# Patient Record
Sex: Male | Born: 1950 | Race: Black or African American | Hispanic: No | Marital: Single | Smoking: Current some day smoker
Health system: Southern US, Community
[De-identification: ages and names within clinical notes are randomized; demographics above are authoritative.]

## PROBLEM LIST (undated history)

## (undated) DIAGNOSIS — E119 Type 2 diabetes mellitus without complications: Secondary | ICD-10-CM

## (undated) DIAGNOSIS — Z720 Tobacco use: Secondary | ICD-10-CM

## (undated) DIAGNOSIS — I2510 Atherosclerotic heart disease of native coronary artery without angina pectoris: Secondary | ICD-10-CM

## (undated) DIAGNOSIS — I509 Heart failure, unspecified: Secondary | ICD-10-CM

## (undated) DIAGNOSIS — R413 Other amnesia: Secondary | ICD-10-CM

## (undated) DIAGNOSIS — I10 Essential (primary) hypertension: Secondary | ICD-10-CM

## (undated) DIAGNOSIS — I4891 Unspecified atrial fibrillation: Secondary | ICD-10-CM

## (undated) DIAGNOSIS — I639 Cerebral infarction, unspecified: Secondary | ICD-10-CM

## (undated) DIAGNOSIS — E785 Hyperlipidemia, unspecified: Secondary | ICD-10-CM

## (undated) HISTORY — DX: Atherosclerotic heart disease of native coronary artery without angina pectoris: I25.10

## (undated) HISTORY — DX: Tobacco use: Z72.0

## (undated) HISTORY — PX: CARDIAC CATHETERIZATION: SHX172

## (undated) HISTORY — DX: Hyperlipidemia, unspecified: E78.5

## (undated) HISTORY — PX: LUNG BIOPSY: SHX232

## (undated) HISTORY — DX: Unspecified atrial fibrillation: I48.91

## (undated) HISTORY — DX: Heart failure, unspecified: I50.9

---

## 2013-03-30 ENCOUNTER — Inpatient Hospital Stay (HOSPITAL_COMMUNITY): Payer: Medicaid Other

## 2013-03-30 ENCOUNTER — Inpatient Hospital Stay (HOSPITAL_COMMUNITY)
Admission: EM | Admit: 2013-03-30 | Discharge: 2013-04-04 | DRG: 064 | Disposition: A | Discharge status: Skilled Nursing Facility | Payer: Medicaid Other | Attending: Internal Medicine | Admitting: Internal Medicine

## 2013-03-30 ENCOUNTER — Emergency Department (HOSPITAL_COMMUNITY): Payer: Medicaid Other

## 2013-03-30 ENCOUNTER — Encounter (HOSPITAL_COMMUNITY): Payer: Self-pay | Admitting: Emergency Medicine

## 2013-03-30 DIAGNOSIS — I5021 Acute systolic (congestive) heart failure: Secondary | ICD-10-CM | POA: Diagnosis present

## 2013-03-30 DIAGNOSIS — I5022 Chronic systolic (congestive) heart failure: Secondary | ICD-10-CM

## 2013-03-30 DIAGNOSIS — Z79899 Other long term (current) drug therapy: Secondary | ICD-10-CM

## 2013-03-30 DIAGNOSIS — I10 Essential (primary) hypertension: Secondary | ICD-10-CM | POA: Diagnosis present

## 2013-03-30 DIAGNOSIS — E785 Hyperlipidemia, unspecified: Secondary | ICD-10-CM | POA: Diagnosis present

## 2013-03-30 DIAGNOSIS — I2589 Other forms of chronic ischemic heart disease: Secondary | ICD-10-CM | POA: Diagnosis present

## 2013-03-30 DIAGNOSIS — IMO0001 Reserved for inherently not codable concepts without codable children: Secondary | ICD-10-CM | POA: Diagnosis present

## 2013-03-30 DIAGNOSIS — R29810 Facial weakness: Secondary | ICD-10-CM | POA: Diagnosis present

## 2013-03-30 DIAGNOSIS — I519 Heart disease, unspecified: Secondary | ICD-10-CM

## 2013-03-30 DIAGNOSIS — I6350 Cerebral infarction due to unspecified occlusion or stenosis of unspecified cerebral artery: Principal | ICD-10-CM | POA: Diagnosis present

## 2013-03-30 DIAGNOSIS — I639 Cerebral infarction, unspecified: Secondary | ICD-10-CM | POA: Diagnosis present

## 2013-03-30 DIAGNOSIS — E119 Type 2 diabetes mellitus without complications: Secondary | ICD-10-CM

## 2013-03-30 DIAGNOSIS — Z8673 Personal history of transient ischemic attack (TIA), and cerebral infarction without residual deficits: Secondary | ICD-10-CM

## 2013-03-30 DIAGNOSIS — Z91199 Patient's noncompliance with other medical treatment and regimen due to unspecified reason: Secondary | ICD-10-CM

## 2013-03-30 DIAGNOSIS — Z9119 Patient's noncompliance with other medical treatment and regimen: Secondary | ICD-10-CM

## 2013-03-30 DIAGNOSIS — I509 Heart failure, unspecified: Secondary | ICD-10-CM | POA: Diagnosis present

## 2013-03-30 DIAGNOSIS — R471 Dysarthria and anarthria: Secondary | ICD-10-CM | POA: Diagnosis present

## 2013-03-30 DIAGNOSIS — F17200 Nicotine dependence, unspecified, uncomplicated: Secondary | ICD-10-CM | POA: Diagnosis present

## 2013-03-30 DIAGNOSIS — R1310 Dysphagia, unspecified: Secondary | ICD-10-CM | POA: Diagnosis present

## 2013-03-30 HISTORY — DX: Essential (primary) hypertension: I10

## 2013-03-30 HISTORY — DX: Cerebral infarction, unspecified: I63.9

## 2013-03-30 HISTORY — DX: Type 2 diabetes mellitus without complications: E11.9

## 2013-03-30 LAB — GLUCOSE, CAPILLARY
Glucose-Capillary: 228 mg/dL — ABNORMAL HIGH (ref 70–99)
Glucose-Capillary: 168 mg/dL — ABNORMAL HIGH (ref 70–99)

## 2013-03-30 LAB — BASIC METABOLIC PANEL
CO2: 27 mEq/L (ref 19–32)
Calcium: 9.7 mg/dL (ref 8.4–10.5)
GFR calc Af Amer: >90 mL/min (ref >90)
Glucose, Bld: 231 mg/dL — ABNORMAL HIGH (ref 70–99)
Sodium: 138 mEq/L (ref 135–145)

## 2013-03-30 LAB — CBC WITH DIFFERENTIAL/PLATELET
Basophils Absolute: 0.1 10*3/uL (ref 0.0–0.1)
Eosinophils Relative: 6 % — ABNORMAL HIGH (ref 0–5)
HCT: 48.4 % (ref 39.0–52.0)
Hemoglobin: 16.7 g/dL (ref 13.0–17.0)
Lymphocytes Relative: 43 % (ref 12–46)
Lymphs Abs: 2.7 10*3/uL (ref 0.7–4.0)
MCH: 28.3 pg (ref 26.0–34.0)
MCHC: 34.5 g/dL (ref 30.0–36.0)
MCV: 81.9 fL (ref 78.0–100.0)
Monocytes Absolute: 0.6 10*3/uL (ref 0.1–1.0)
Platelets: 219 10*3/uL (ref 150–400)
RBC: 5.91 MIL/uL — ABNORMAL HIGH (ref 4.22–5.81)
WBC: 6.3 10*3/uL (ref 4.0–10.5)

## 2013-03-30 LAB — ETHANOL: Alcohol, Ethyl (B): <11 mg/dL (ref 0–11)

## 2013-03-30 MED ORDER — STARCH (THICKENING) PO POWD
ORAL | Status: DC | PRN
  Filled 2013-03-30: qty 227

## 2013-03-30 MED ORDER — SENNOSIDES-DOCUSATE SODIUM 8.6-50 MG PO TABS: 1.0000 | ORAL_TABLET | Freq: Every day | ORAL | Status: DC

## 2013-03-30 MED ORDER — ENOXAPARIN SODIUM 40 MG/0.4ML ~~LOC~~ SOLN
40.0000 mg | SUBCUTANEOUS | Status: DC
  Administered 2013-03-30 – 2013-04-03 (×5): 40 mg via SUBCUTANEOUS
  Filled 2013-03-30 (×6): qty 0.4

## 2013-03-30 MED ORDER — ASPIRIN 325 MG PO TABS
325.0000 mg | ORAL_TABLET | Freq: Every day | ORAL | Status: DC
  Administered 2013-03-30 – 2013-04-04 (×6): 325 mg via ORAL
  Filled 2013-03-30 (×6): qty 1

## 2013-03-30 MED ORDER — HEPARIN SODIUM (PORCINE) 5000 UNIT/ML IJ SOLN
5000.0000 [IU] | Freq: Three times a day (TID) | INTRAMUSCULAR | Status: DC
  Filled 2013-03-30 (×3): qty 1

## 2013-03-30 MED ORDER — INSULIN ASPART 100 UNIT/ML ~~LOC~~ SOLN
0.0000 [IU] | Freq: Three times a day (TID) | SUBCUTANEOUS | Status: DC
  Administered 2013-03-31: 2 [IU] via SUBCUTANEOUS
  Administered 2013-03-31: 5 [IU] via SUBCUTANEOUS
  Administered 2013-03-31: 1 [IU] via SUBCUTANEOUS
  Administered 2013-04-01 (×2): 2 [IU] via SUBCUTANEOUS
  Administered 2013-04-02: 3 [IU] via SUBCUTANEOUS
  Administered 2013-04-02: 2 [IU] via SUBCUTANEOUS
  Administered 2013-04-03: 1 [IU] via SUBCUTANEOUS
  Administered 2013-04-04: 2 [IU] via SUBCUTANEOUS

## 2013-03-30 MED ORDER — ASPIRIN 300 MG RE SUPP
300.0000 mg | Freq: Every day | RECTAL | Status: DC
  Filled 2013-03-30 (×6): qty 1

## 2013-03-30 NOTE — H&P (Signed)
Date: 03/30/2013               Patient Name:  Jerry Graham MRN: [id number]  DOB: 04/22/1951 Age / Sex: 62 y.o., male   PCP: No primary provider on file.         Medical Service: Internal Medicine Teaching Service         Attending Physician: Dr. Inez Catalina, MD    First Contact: Dr. Vivi Barrack Pager: [phone]  Second Contact: Dr. Suszanne Conners Pager: [phone]       After Hours (After 5p/  First Contact Pager: [phone]  weekends / holidays): Second Contact Pager: [phone]   Chief Complaint: "My mouth is twisted  History of Present Illness:  Jerry Graham is a 63 y.o. male PMH DM2, HTN, tobacco abuse who presents with a left facial droop.  The patient reports he woke up this morning and his "mouth was twisted". He also endorses drooling and trouble swallowing his pills. He got in the car this morning and started driving, and noted he kept drifting into the left lane. He drove back home. He denies chest pain, SOB, abdominal pain, nausea, vomiting, diarrhea, constipation, headache, blurry vision, double vision, visual field cuts, orthopnea, leg swelling. He denies focal weakness or numbness, but does state he felt clumsy on his feet this morning.  He notes the exact same symptoms happened to him last year, with a left sided facial droop and trouble swallowing. He went to the hospital and his symptoms resolved overnight. He was told he had a "mini stroke." He remembers they did a CT of the head and an echocardiogram, among other things. He was living in [place] at the time and went to [facility]. Records have been requested. He does not take a daily aspirin.   He moved to [place] 5 months ago and has not set up with a primary care doctor here. He has some family locally, a cousin. 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 He is a 1/2 PPD smoker and also uses an e-cigarette. He denies alcohol or drug use. 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   Home Meds: Current Outpatient Prescriptions  Medication Sig Dispense Refill  . glipiZIDE (GLUCOTROL) 5 MG tablet Take 5 mg by mouth daily.      Marland Kitchen lisinopril (PRINIVIL,ZESTRIL) 20 MG tablet Take 20 mg by mouth daily.      . metFORMIN (GLUCOPHAGE) 850 MG tablet Take 850 mg by mouth daily.        Allergies: Allergies as of 03/30/2013  . (No Known Allergies)   Past Medical History  Diagnosis Date  . Hypertension   . Diabetes mellitus without complication   . Stroke    History reviewed. No pertinent past surgical history. History reviewed. No pertinent family history. History   Social History  . Marital Status: Single    Spouse Name: N/A    Number of Children: N/A  . Years of Education: N/A   Occupational History  . Not on file.   Social History Main Topics  . Smoking status: Current Every Day Smoker  . Smokeless tobacco: Not on file  . Alcohol Use: No  . Drug Use: No  . Sexual Activity: Not on file   Other Topics Concern  . Not on file   Social History Narrative  . No narrative on file  Review of Systems: Pertinent items are noted in HPI.  Physical Exam: Blood pressure 175/93, pulse 86, temperature 98.7 F (37.1 C), temperature source Oral, resp. rate 18, height 5' 10.5" (1.791 m), weight 178 lb 1.6 oz (80.786 kg), SpO2 94.00%. Physical Exam  Constitutional: He is oriented to person, place, and time and well-developed, well-nourished, and in no distress.  HENT:  Head: Normocephalic and atraumatic.  Mouth/Throat: Oropharynx is clear and moist.  Eyes: Conjunctivae and EOM are normal. Pupils are equal, round, and reactive to light.  Neck: Normal range of motion. Neck supple.  Cardiovascular: Normal rate, regular rhythm and normal heart sounds.  Exam reveals no gallop and no friction rub.   No murmur heard. Pulmonary/Chest: Effort normal. No respiratory distress. He has no wheezes. He has rales (Soft  crackles at lung bases bilaterally). He exhibits no tenderness.  Abdominal: Soft. There is no tenderness.  Musculoskeletal: Normal range of motion. He exhibits no edema and no tenderness.  Neurological: He is alert and oriented to person, place, and time. He has normal reflexes. He displays facial asymmetry and abnormal speech (Dysarthria). He displays no weakness, no atrophy, no tremor, normal stance and normal reflexes. A cranial nerve deficit (Left lower facial droop with forehead sparing. Tongue deviation to left. Decreased hearing on left. ) is present. No sensory deficit. He exhibits normal muscle tone. He has an abnormal Cerebellar Exam (Impaired rapid alternating finger movements in left hand). He has a normal Finger-Nose-Finger Test and a normal Romberg Test. Gait normal. Coordination abnormal. Gait normal. GCS score is 15.  Reflex Scores:      Brachioradialis reflexes are 2+ on the right side and 2+ on the left side.      Patellar reflexes are 2+ on the right side and 2+ on the left side. Skin: Skin is warm and dry. He is not diaphoretic.  Psychiatric: Affect normal.     Lab results: Basic Metabolic Panel:  Recent Labs  21/30/86 1141  NA 138  K 4.2  CL 100  CO2 27  GLUCOSE 231*  BUN 10  CREATININE 0.75  CALCIUM 9.7   CBC:  Recent Labs  03/30/13 1141  WBC 6.3  NEUTROABS 2.5  HGB 16.7  HCT 48.4  MCV 81.9  PLT 219  No left shift.  CBG:  Recent Labs  03/30/13 1113 03/30/13 1713  GLUCAP 228* 168*   Alcohol Level:  Recent Labs  03/30/13 1141  ETH <11    Imaging results:  Ct Head Wo Contrast  03/30/2013   CLINICAL DATA:  Headache, vertigo  EXAM: CT HEAD WITHOUT CONTRAST  TECHNIQUE: Contiguous axial images were obtained from the base of the skull through the vertex without intravenous contrast.  COMPARISON:  None.  FINDINGS: No skull fracture is noted. Paranasal sinuses and mastoid air cells are unremarkable.  No intracranial hemorrhage, mass effect or  midline shift. Mild cerebral atrophy. Mild periventricular and patchy subcortical white matter decreased attenuation probable due to chronic small vessel ischemic changes. There is small lacunar infarct measures about 5 mm in right thalamus. There is small area of decreased attenuation in right basal ganglia. Measures about 1.2 cm. This may be due to ischemia of indeterminate age. Clinical correlation is necessary. If recent ischemia suspected further correlation with brain MRI with diffusion imaging is recommended. No definite acute cortical infarction. No mass lesion is noted on this unenhanced scan.  IMPRESSION: No intracranial hemorrhage, mass effect or midline shift. Mild cerebral atrophy.There is small lacunar infarct measures about  5 mm in right thalamus. There is small area of decreased attenuation in right basal ganglia. Measures about 1.2 cm. This may be due to ischemia of indeterminate age. Clinical correlation is necessary. If recent ischemia suspected further correlation with brain MRI with diffusion imaging is recommended. No definite acute cortical infarction.   Electronically Signed   By: Natasha Mead   On: 03/30/2013 13:07    Other results: EKG: No olds. NSR, rate 78. LVH. 1mm ST depression in V5,V6.  Assessment & Plan by Problem: Jerry Graham is a 62 y.o. male PMH DM2, HTN, tobacco abuse who presents with a left facial droop.  #Acute ischemic stroke - Patient with left facial droop with forehead sparing, dysarthria, hand clumsiness. Out of window for TPA. Multiple risk factors for CVA including everyday smoking, HTN, DM2, history of presumed TIA last year. CT remarkable for small lacunar infarct in right thalamus, small area of decreased attenuation in right basal ganglia. He has a history of similar symptoms that resolved. We have requested records from [facility]. - Admit to IMTS, telemetry bed - [facility] up [facility] from [facility] - MRI/MRA - ASA 325 daily (oral or  pr) - 2D echo - Carotid dopplers - Lipid panel - NPO pending SLP eval - Neuro checks q2h x12h, then q4h - NIH stroke scale per nursing shift - PT/OT  #Type II diabetes mellitus - On metformin and glipizide at home. - SSI - Checking A1C - CBG monitoring  #Hypertension - Holding home lisinopril to allow for permissive hypertension in the setting of acute stroke.  #Tobacco abuse - Denies cough, SOB. Patient is everyday smoker. Bibasilar crackles on exam. - Chest x-ray - Smoking cessation counseling  #DVT PPX - Subq heparin  Dispo: Disposition is deferred at this time, awaiting improvement of current medical problems. Anticipated discharge in approximately 1-3 day(s).   The patient does not have a current PCP (No primary provider on file.) and does need an [facility] hospital follow-up appointment after discharge.  The patient does not have transportation limitations that hinder transportation to clinic appointments.  Signed: Vivi Barrack, MD 03/30/2013, 6:31 PM

## 2013-03-30 NOTE — ED Provider Notes (Addendum)
CSN: [id number]     Arrival date & time 03/30/13  1100 History   First MD Initiated Contact with Patient 03/30/13 1106     Chief Complaint  Patient presents with  . Facial Droop    HPI  Jerry Graham just moved in [place] from [place] the summer. During the summer he had an event where he awakened in his left side of his face was drooping. He went [facility] [employer]. He was admitted. He was told that he had a stroke. He states "they didn't otherwise tell me nothing". He is hypertensive diabetic and a smoker. He does take aspirin "sometimes". He was feeling  normally yesterday. However when walking last night he felt like he was falling to his left. He awakened this morning and feels like the left lower side of his face is "sliding off". No changes with vision or hemianopsia. He had difficulty swallowing his pills this morning. He states he put them in his mouth and they fell out a few times. He was able to swallow coffee and juice without choking or coughing. However he states that he's been having trouble "spitting my spit". He has no headache. He denies left arm and leg symptoms. He has been able to walk. He states he does have to hold on when he does. No vertigo.  Past Medical History  Diagnosis Date  . Hypertension   . Diabetes mellitus without complication   . Stroke    History reviewed. No pertinent past surgical history. History reviewed. No pertinent family history. History  Substance Use Topics  . Smoking status: Current Every Day Smoker  . Smokeless tobacco: Not on file  . Alcohol Use: No    Review of Systems  Constitutional: Negative for fever, chills, diaphoresis, appetite change and fatigue.  HENT: Positive for drooling and trouble swallowing. Negative for sore throat and mouth sores.   Eyes: Negative for visual disturbance.  Respiratory: Negative for cough, chest tightness, shortness of breath and wheezing.   Cardiovascular: Negative for chest pain.   Gastrointestinal: Negative for nausea, vomiting, abdominal pain, diarrhea and abdominal distention.  Endocrine: Negative for polydipsia, polyphagia and polyuria.  Genitourinary: Negative for dysuria, frequency and hematuria.  Musculoskeletal: Negative for gait problem.  Skin: Negative for color change, pallor and rash.  Neurological: Positive for weakness and numbness. Negative for dizziness, syncope, light-headedness and headaches.  Hematological: Does not bruise/bleed easily.  Psychiatric/Behavioral: Negative for behavioral problems and confusion.    Allergies  Review of patient's allergies indicates no known allergies.  Home Medications   Current Outpatient Rx  Name  Route  Sig  Dispense  Refill  . glipiZIDE (GLUCOTROL) 5 MG tablet   Oral   Take 5 mg by mouth daily.         Marland Kitchen lisinopril (PRINIVIL,ZESTRIL) 20 MG tablet   Oral   Take 20 mg by mouth daily.         . metFORMIN (GLUCOPHAGE) 850 MG tablet   Oral   Take 850 mg by mouth daily.          BP 168/96  Pulse 59  Temp(Src) 98.8 F (37.1 C) (Oral)  Resp 15  SpO2 97% Physical Exam  Constitutional: He is oriented to person, place, and time. He appears well-developed and well-nourished. No distress.  HENT:  Head: Normocephalic.  Eyes: Conjunctivae are normal. Pupils are equal, round, and reactive to light. No scleral icterus.  Neck: Normal range of motion. Neck supple. No thyromegaly present.  No bruits  Cardiovascular: Normal rate and regular rhythm.  Exam reveals no gallop and no friction rub.   No murmur heard. Normal sinus rhythm on the monitor without ectopy  Pulmonary/Chest: Effort normal and breath sounds normal. No respiratory distress. He has no wheezes. He has no rales.  Abdominal: Soft. Bowel sounds are normal. He exhibits no distension. There is no tenderness. There is no rebound.  Musculoskeletal: Normal range of motion.  Neurological: He is alert and oriented to person, place, and time.  His  visual fields are intact to confrontation. He has a left lower facial droop. He does not have a upper facial droop or Bell's phenomenon. No nystagmus. Full extraocular movements. Normal tongue protrusion. No carotid bruits.  Subtle pronator drift to the LUE.Marland Kitchen Normal strength to the lower extremities supine.. Gait not tested.  Skin: Skin is warm and dry. No rash noted.  Psychiatric: He has a normal mood and affect. His behavior is normal.    ED Course  Procedures (including critical care time) EKG: Indication stroke interpretation sinus rhythm is voltage criteria for LVH with ST depression T wave inversions laterally. No comparison available.  Labs Review Labs Reviewed  CBC WITH DIFFERENTIAL - Abnormal; Notable for the following:    RBC 5.91 (*)    Neutrophils Relative % 40 (*)    Eosinophils Relative 6 (*)    All other components within normal limits  BASIC METABOLIC PANEL - Abnormal; Notable for the following:    Glucose, Bld 231 (*)    All other components within normal limits  GLUCOSE, CAPILLARY - Abnormal; Notable for the following:    Glucose-Capillary 228 (*)    All other components within normal limits  ETHANOL   Imaging Review Ct Head Wo Contrast  03/30/2013   CLINICAL DATA:  Headache, vertigo  EXAM: CT HEAD WITHOUT CONTRAST  TECHNIQUE: Contiguous axial images were obtained from the base of the skull through the vertex without intravenous contrast.  COMPARISON:  None.  FINDINGS: No skull fracture is noted. Paranasal sinuses and mastoid air cells are unremarkable.  No intracranial hemorrhage, mass effect or midline shift. Mild cerebral atrophy. Mild periventricular and patchy subcortical white matter decreased attenuation probable due to chronic small vessel ischemic changes. There is small lacunar infarct measures about 5 mm in right thalamus. There is small area of decreased attenuation in right basal ganglia. Measures about 1.2 cm. This may be due to ischemia of indeterminate age.  Clinical correlation is necessary. If recent ischemia suspected further correlation with brain MRI with diffusion imaging is recommended. No definite acute cortical infarction. No mass lesion is noted on this unenhanced scan.  IMPRESSION: No intracranial hemorrhage, mass effect or midline shift. Mild cerebral atrophy.There is small lacunar infarct measures about 5 mm in right thalamus. There is small area of decreased attenuation in right basal ganglia. Measures about 1.2 cm. This may be due to ischemia of indeterminate age. Clinical correlation is necessary. If recent ischemia suspected further correlation with brain MRI with diffusion imaging is recommended. No definite acute cortical infarction.   Electronically Signed   By: Natasha Mead   On: 03/30/2013 13:07    MDM   1. CVA (cerebral infarction)      He presents with right hemispheric stroke symptoms. He has dysphasia clinically and left lower facial droop. History of same with complete resolution.  Multiple risk factors. CT evaluation pending. He is out of the window for acute lytic treatment.  CT to my read shows no hemorrhage.  Per  radiologist no hemorrhage. No acute cortical infarct. Small areas of lacunar infarcts in the right illness. Perhaps an area of ischemia on the right basal ganglia.  Pt failed bedside swallow study with coughing on drinking water. Will keep NPO for now.  Call placed to Hospitalist re: Admission.    Roney Marion, MD 03/30/13 1317  Roney Marion, MD 03/30/13 [id number]

## 2013-03-30 NOTE — ED Notes (Signed)
Pt states he started to notice that last night he had some difficulty keeping his balance when ambulating. Pt states he woke up this morning and his face was drooping. Visible droop to the left side of face.

## 2013-03-30 NOTE — Progress Notes (Signed)
Patient admitted to room 4N15 at this time. Noted cigarettes, lighter, and wallet with patient. Explained to patient about [facility] policy and patient insisted on keeping his wallet. cigarettes and lighter kept in his med drawer at this time.

## 2013-03-30 NOTE — Evaluation (Signed)
Clinical/Bedside Swallow Evaluation Patient Details  Name: Jerry Graham MRN: [id number] Date of Birth: 04/22/1951  Today's Date: 03/30/2013 Time: 4098-1191 SLP Time Calculation (min): 55 min  Past Medical History:  Past Medical History  Diagnosis Date  . Hypertension   . Diabetes mellitus without complication   . Stroke    Past Surgical History: History reviewed. No pertinent past surgical history. HPI:  62 year old male admitted 03/30/13 with left lower facial weakness.  PMH significant for CVA, HTN, DM, + tobacco, difficulty with pills this morning, failed RNSSS.  CT results indicate no hemorrhage, mild atrophy, small lacunar infarct in the right thalamus, decreased attentuation in the right basal ganglia.   Assessment / Plan / Recommendation Clinical Impression  Oral care completed with suction. Pt speech intelligible, but noticably dysarthric.  Left facial weakness and lingual deviation noted.  Anterior leakage of thin liquid via cup noted during oral care.  Pt appears to tolerate puree, solid and nectar thick liquid consistencies without overt s/s aspiration.  Delayed cough response noted after thin liquid via straw.  Pt is at high risk of aspiration given thalamic/basal ganglia involvement, so objective study is recommended to more formally assess swallow function and safety.  In the interim, convservative diet of dys 2 with nectar thick liquids is recommended, with adherence to posted swallow precautions.  RN aware.      Aspiration Risk  Moderate    Diet Recommendation Dysphagia 2 (Fine chop);Nectar-thick liquid   Liquid Administration via: Straw Medication Administration: Crushed with puree Supervision: Full supervision/cueing for compensatory strategies Compensations: Slow rate;Small sips/bites;Check for pocketing;Follow solids with liquid Postural Changes and/or Swallow Maneuvers: Seated upright 90 degrees;Upright 30-60 min after meal    Other  Recommendations Recommended  Consults: MBS Oral Care Recommendations: Oral care QID Other Recommendations: Have oral suction available;Remove water pitcher   Follow Up Recommendations  Inpatient Rehab    Frequency and Duration min 1 x/week  1 week   Pertinent Vitals/Pain No pain    SLP Swallow Goals Patient will consume recommended diet without observed clinical signs of aspiration with: Moderate assistance Patient will utilize recommended strategies during swallow to increase swallowing safety with: Moderate assistance   Swallow Study Prior Functional Status   Pt reports tolerating regular diet prior to admit    General Date of Onset: 03/30/13 HPI: 62 year old male admitted 03/30/13 with left lower facial weakness.  PMH significant for CVA, HTN, DM, + tobacco, difficulty with pills this morning, failed RNSSS.  CT results indicate no hemorrhage, mild atrophy, small lacunar infarct in the right thalamus, decreased attentuation in the right basal ganglia. Type of Study: Bedside swallow evaluation Diet Prior to this Study: NPO Temperature Spikes Noted: No Respiratory Status: Room air History of Recent Intubation: No Behavior/Cognition: Alert;Cooperative;Pleasant mood Oral Cavity - Dentition: Missing dentition (upper partial) Self-Feeding Abilities: Able to feed self Patient Positioning: Upright in bed Baseline Vocal Quality: Clear Volitional Cough: Weak Volitional Swallow: Able to elicit    Oral/Motor/Sensory Function Overall Oral Motor/Sensory Function: Impaired Labial ROM: Reduced left Labial Symmetry: Abnormal symmetry left Labial Strength: Reduced Labial Sensation: Reduced Lingual ROM: Reduced right Lingual Symmetry: Abnormal symmetry left (deviates to the left) Lingual Strength: Reduced Lingual Sensation: Reduced Facial ROM: Reduced left Facial Symmetry: Left droop;Left drooping eyelid;Right drooping eyelid Facial Strength: Reduced Facial Sensation: Reduced Mandible: Within Functional Limits    Ice Chips Ice chips: Within functional limits Presentation: Spoon   Thin Liquid Thin Liquid: Impaired Presentation: Straw;Cup Oral Phase Impairments: Reduced labial  seal Oral Phase Functional Implications: Left anterior spillage Pharyngeal  Phase Impairments: Cough - Delayed    Nectar Thick Nectar Thick Liquid: Within functional limits Presentation: Straw   Honey Thick Honey Thick Liquid: Not tested   Puree Puree: Within functional limits Presentation: Spoon;Self Fed   Solid   Jerry Graham   Jerry Graham B. Jerry Graham, Jerry Graham, Jerry Graham [phone]  Solid: Within functional limits Presentation: Self Fed       Jerry Graham 03/30/2013,5:03 PM

## 2013-03-30 NOTE — ED Notes (Signed)
Pt here from home with c/o left sided facial drop that he awoke with , lsn would have been the night before , pt also has some slurred speech , pt has had a similar episode in the past

## 2013-03-30 NOTE — Progress Notes (Signed)
Patient is refusing his insulin coverage and Heparin sq. States he does not take shots and that he takes metformin for his sugar control. Explained to him the risks of refusing his meds and patient states he does not care.

## 2013-03-30 NOTE — ED Notes (Signed)
CBG 228 

## 2013-03-30 NOTE — ED Notes (Signed)
Dr. James at bedside  

## 2013-03-31 ENCOUNTER — Inpatient Hospital Stay (HOSPITAL_COMMUNITY): Payer: Medicaid Other

## 2013-03-31 LAB — GLUCOSE, CAPILLARY
Glucose-Capillary: 260 mg/dL — ABNORMAL HIGH (ref 70–99)
Glucose-Capillary: 132 mg/dL — ABNORMAL HIGH (ref 70–99)
Glucose-Capillary: 140 mg/dL — ABNORMAL HIGH (ref 70–99)

## 2013-03-31 LAB — LIPID PANEL
HDL: 38 mg/dL — ABNORMAL LOW (ref >39)
LDL Cholesterol: 197 mg/dL — ABNORMAL HIGH (ref 0–99)
Total CHOL/HDL Ratio: 7.2 RATIO

## 2013-03-31 LAB — HEMOGLOBIN A1C: Mean Plasma Glucose: 226 mg/dL — ABNORMAL HIGH (ref <117)

## 2013-03-31 MED ORDER — METFORMIN HCL 850 MG PO TABS
850.0000 mg | ORAL_TABLET | Freq: Every day | ORAL | Status: DC
  Administered 2013-03-31 – 2013-04-04 (×5): 850 mg via ORAL
  Filled 2013-03-31 (×6): qty 1

## 2013-03-31 MED ORDER — ATORVASTATIN CALCIUM 80 MG PO TABS
80.0000 mg | ORAL_TABLET | Freq: Every day | ORAL | Status: DC
  Administered 2013-03-31 – 2013-04-03 (×4): 80 mg via ORAL
  Filled 2013-03-31 (×5): qty 1

## 2013-03-31 MED ORDER — NICOTINE 14 MG/24HR TD PT24
14.0000 mg | MEDICATED_PATCH | Freq: Every day | TRANSDERMAL | Status: DC
  Administered 2013-03-31 – 2013-04-04 (×5): 14 mg via TRANSDERMAL
  Filled 2013-03-31 (×5): qty 1

## 2013-03-31 MED ORDER — GLIPIZIDE 5 MG PO TABS
5.0000 mg | ORAL_TABLET | Freq: Every day | ORAL | Status: DC
  Administered 2013-03-31 – 2013-04-04 (×5): 5 mg via ORAL
  Filled 2013-03-31 (×6): qty 1

## 2013-03-31 NOTE — Progress Notes (Signed)
  Date: 03/31/2013  Patient name: Jerry Graham  Medical record number: [id number]  Date of birth: 04/22/1951   This patient has been seen and the plan of care was discussed with the house staff. Please see their note for complete details. I concur with their findings with the following additions/corrections:  For his acute stroke, aspirin for 2nd prevention, statin and have noted speech workup and need for dysphagia 3 diet.  He will need to follow up with Neurology as an outpatient.  His TTE, concerningly, revealed some mod to severe systolic heart failure.  Tmw, the team should consider starting him on medications for this issue and also consider consulting HF team for new HF diagnosis and need for outpatient follow up.  He does not appear to be acutely volume overloaded, but he will need to be on appropriate therapy such as statin, aspirin (already on for stroke), Ace-I and BB.  Other issues appropriately covered in Dr. Doristine Locks note.  PT/OT recommend CIR, SW and CM are involved.   Inez Catalina, MD 03/31/2013, 4:13 PM

## 2013-03-31 NOTE — Procedures (Signed)
Objective Swallowing Evaluation: Modified Barium Swallowing Study  Patient Details  Name: Jerry Graham MRN: [id number] Date of Birth: 04/22/1951  Today's Date: 03/31/2013 Time: 1030-1047 SLP Time Calculation (min): 17 min  Past Medical History:  Past Medical History  Diagnosis Date  . Hypertension   . Diabetes mellitus without complication   . Stroke    Past Surgical History: History reviewed. No pertinent past surgical history. HPI:  62 year old male admitted 03/30/13 with left lower facial weakness.  PMH significant for CVA, HTN, DM, + tobacco, difficulty with pills this morning, failed RNSSS.  CT results indicate no hemorrhage, mild atrophy, small lacunar infarct in the right thalamus, decreased attentuation in the right basal ganglia.     Assessment / Plan / Recommendation Clinical Impression  Dysphagia Diagnosis: Moderate oral phase dysphagia;Mild pharyngeal phase dysphagia Clinical impression: Pt demonstrates a primary oral phase dysphagia with left lingual and labial weakness with premature spillage of boluses and moderate oral residuals after the swallow. There are also pharyngeal sensory deficits resulting in penetration/aspration before the swallow with thin liquids with late sensation and ineficient expectoration. Pt would be able to tolerate nectar thick liquids except that oral residuals fall to airway post swallow. Pts cognitive impairments impede timely adherance to cues and strategies. If pt could tuck chin and quickly swallow twice without impulsive talking, nectar thick liquids may be attempted. For now, will recommend dys 3 (mechanical soft) with honey thick liquids until pt demonstrates improved adherance to strategies. A chin tuck is not particularly needed with honey thick liquids, but it improves airway closure for nectar thick liquids.     Treatment Recommendation  Therapy as outlined in treatment plan below    Diet Recommendation Dysphagia 3 (Mechanical  Soft);Honey-thick liquid   Liquid Administration via: Cup Medication Administration: Whole meds with puree Supervision: Full supervision/cueing for compensatory strategies Compensations: Small sips/bites;Check for pocketing;Multiple dry swallows after each bite/sip;Clear throat intermittently Postural Changes and/or Swallow Maneuvers: Seated upright 90 degrees;Upright 30-60 min after meal;Chin tuck    Other  Recommendations Oral Care Recommendations: Oral care BID Other Recommendations: Remove water pitcher   Follow Up Recommendations  Inpatient Rehab    Frequency and Duration min 2x/week  2 weeks   Pertinent Vitals/Pain NA    SLP Swallow Goals Patient will utilize recommended strategies during swallow to increase swallowing safety with: Minimal cueing Swallow Study Goal #2 - Progress: Progressing toward goal   General HPI: 62 year old male admitted 03/30/13 with left lower facial weakness.  PMH significant for CVA, HTN, DM, + tobacco, difficulty with pills this morning, failed RNSSS.  CT results indicate no hemorrhage, mild atrophy, small lacunar infarct in the right thalamus, decreased attentuation in the right basal ganglia. Type of Study: Modified Barium Swallowing Study Reason for Referral: Objectively evaluate swallowing function Diet Prior to this Study: Dysphagia 2 (chopped);Nectar-thick liquids Temperature Spikes Noted: No Respiratory Status: Room air History of Recent Intubation: No Behavior/Cognition: Alert;Cooperative;Pleasant mood Oral Cavity - Dentition: Missing dentition Oral Motor / Sensory Function: Impaired - see Bedside swallow eval Patient Positioning: Upright in chair Baseline Vocal Quality: Wet Volitional Cough: Strong Volitional Swallow: Able to elicit (with effort anc ues) Anatomy: Within functional limits Pharyngeal Secretions: Not observed secondary MBS    Reason for Referral Objectively evaluate swallowing function   Oral Phase Oral Preparation/Oral  Phase Oral Phase: Impaired Oral - Honey Oral - Honey Cup: Left anterior bolus loss;Weak lingual manipulation;Left pocketing in lateral sulci;Lingual/palatal residue;Delayed oral transit Oral - Nectar Oral -  Nectar Cup: Left anterior bolus loss;Weak lingual manipulation;Left pocketing in lateral sulci;Lingual/palatal residue;Delayed oral transit Oral - Thin Oral - Thin Cup: Left anterior bolus loss;Weak lingual manipulation;Left pocketing in lateral sulci;Lingual/palatal residue;Delayed oral transit Oral - Solids Oral - Puree: Left anterior bolus loss;Weak lingual manipulation;Left pocketing in lateral sulci;Lingual/palatal residue;Delayed oral transit Oral - Regular: Left anterior bolus loss;Weak lingual manipulation;Left pocketing in lateral sulci;Lingual/palatal residue;Delayed oral transit Oral - Pill: Left anterior bolus loss;Weak lingual manipulation;Left pocketing in lateral sulci;Lingual/palatal residue;Delayed oral transit   Pharyngeal Phase Pharyngeal Phase Pharyngeal Phase: Impaired Pharyngeal - Honey Pharyngeal - Honey Cup: Delayed swallow initiation;Premature spillage to valleculae Pharyngeal - Nectar Pharyngeal - Nectar Cup: Delayed swallow initiation;Premature spillage to pyriform sinuses;Penetration/Aspiration after swallow;Penetration/Aspiration before swallow;Moderate aspiration Penetration/Aspiration details (nectar cup): Material enters airway, CONTACTS cords and not ejected out;Material enters airway, passes BELOW cords without attempt by patient to eject out (silent aspiration);Material does not enter airway Pharyngeal - Thin Pharyngeal - Thin Cup: Delayed swallow initiation;Premature spillage to pyriform sinuses;Penetration/Aspiration before swallow;Penetration/Aspiration after swallow;Moderate aspiration Penetration/Aspiration details (thin cup): Material enters airway, passes BELOW cords and not ejected out despite cough attempt by patient Pharyngeal -  Solids Pharyngeal - Puree: Delayed swallow initiation Pharyngeal - Regular: Delayed swallow initiation Pharyngeal - Pill: Delayed swallow initiation  Cervical Esophageal Phase    GO    Cervical Esophageal Phase Cervical Esophageal Phase: [facility]        Harlon Ditty, MA CCC-SLP [phone]  Claudine Mouton 03/31/2013, 11:17 AM

## 2013-03-31 NOTE — Evaluation (Signed)
I have reviewed this note and agree with all findings. Kati Meranda Dechaine, PT, DPT Pager: [phone]   

## 2013-03-31 NOTE — Progress Notes (Signed)
Subjective: Patient seen at bedside. He feels well. He still has a facial droop. He says a bunch of therapists have come by to see him and he is amenable to eating a dysphagia diet and going to rehab to improve his strength.   Records from [place] obtained. Patient was admitted from 10/04/11-10/06/12 for left-sided facial droop, found to have a right internal capsule infarct on MRI. Patient left AMA before a full workup (including 2D echo) could be performed. He was d/c on Zocor and ASA but did not comply apparently. There was also a questionable lung mass seen on his admission CXR, which per the discharge summary was NOT able to be worked up due to the patient leaving AMA. (However, in the packet of charts they faxed there is a report for a CT chest w/o contrast dated 10/06/11 that found no lung mass?)    Objective: Vital signs in last 24 hours: Filed Vitals:   03/31/13 0425 03/31/13 0605 03/31/13 1146 03/31/13 1400  BP: 156/94 133/78 168/83 157/80  Pulse: 82 79 91 92  Temp: 97.8 F (36.6 C) 98.6 F (37 C) 98 F (36.7 C) 98.3 F (36.8 C)  TempSrc: Oral Oral Oral Oral  Resp: 18 18 20 18   Height:      Weight:      SpO2: 95% 94% 94% 96%   Weight change:  No intake or output data in the 24 hours ending 03/31/13 1557  Physical Exam  Constitutional: He is oriented to person, place, and time and well-developed, well-nourished, and in no distress.  HENT:  Head: Normocephalic and atraumatic.  Mouth/Throat: Oropharynx is clear and moist.  Eyes: Conjunctivae and EOM are normal. Pupils are equal, round, and reactive to light.  Neck: Normal range of motion. Neck supple.  Cardiovascular: Normal rate, regular rhythm and normal heart sounds. Exam reveals no gallop and no friction rub.  No murmur heard.  Pulmonary/Chest: Effort normal. No respiratory distress. He has no wheezes. He has soft crackles at lung bases bilaterally. He exhibits no tenderness.  Abdominal: Soft. There is no tenderness.    Musculoskeletal: Normal range of motion. He exhibits no edema and no tenderness.  Neurological: He is alert and oriented to person, place, and time. He has normal reflexes. He displays facial asymmetry and abnormal speech (Dysarthria). He displays no weakness, no atrophy, no tremor, normal stance and normal reflexes. A cranial nerve deficit (Left lower facial droop with forehead sparing. Tongue deviation to left. Decreased hearing on left. ) is present. No sensory deficit. He exhibits normal muscle tone. He has an abnormal Cerebellar Exam (Impaired rapid alternating finger movements in left hand). He has a normal Finger-Nose-Finger Test and a normal Romberg Test. Gait normal. Coordination abnormal. Gait normal. GCS score is 15.  Reflex Scores:  Brachioradialis reflexes are 2+ on the right side and 2+ on the left side.  Patellar reflexes are 2+ on the right side and 2+ on the left side.  Skin: Skin is warm and dry. He is not diaphoretic.  Psychiatric: Affect normal.   Lab Results: Basic Metabolic Panel:  Recent Labs Lab 03/30/13 1141  NA 138  K 4.2  CL 100  CO2 27  GLUCOSE 231*  BUN 10  CREATININE 0.75  CALCIUM 9.7   CBC:  Recent Labs Lab 03/30/13 1141  WBC 6.3  NEUTROABS 2.5  HGB 16.7  HCT 48.4  MCV 81.9  PLT 219   CBG:  Recent Labs Lab 03/30/13 1113 03/30/13 1713 03/30/13 2128 03/31/13  0716 03/31/13 1135  GLUCAP 228* 168* 168* 197* 260*   Fasting Lipid Panel:  Recent Labs Lab 03/31/13 0620  CHOL 275*  HDL 38*  LDLCALC 197*  TRIG 202*  CHOLHDL 7.2   Alcohol Level:  Recent Labs Lab 03/30/13 1141  ETH <11    Micro Results: No results found for this or any previous visit (from the past 240 hour(s)). Studies/Results: Dg Chest 2 View  03/30/2013   CLINICAL DATA:  Stroke  EXAM: CHEST  2 VIEW  COMPARISON:  None.  FINDINGS: Cardiomediastinal silhouette is unremarkable. Elevation of the right hemidiaphragm is noted. No acute infiltrate or pulmonary edema.  Bony thorax is unremarkable.  IMPRESSION: No active cardiopulmonary disease. Elevation of the right hemidiaphragm.   Electronically Signed   By: Natasha Mead   On: 03/30/2013 20:12   Ct Head Wo Contrast  03/30/2013   CLINICAL DATA:  Headache, vertigo  EXAM: CT HEAD WITHOUT CONTRAST  TECHNIQUE: Contiguous axial images were obtained from the base of the skull through the vertex without intravenous contrast.  COMPARISON:  None.  FINDINGS: No skull fracture is noted. Paranasal sinuses and mastoid air cells are unremarkable.  No intracranial hemorrhage, mass effect or midline shift. Mild cerebral atrophy. Mild periventricular and patchy subcortical white matter decreased attenuation probable due to chronic small vessel ischemic changes. There is small lacunar infarct measures about 5 mm in right thalamus. There is small area of decreased attenuation in right basal ganglia. Measures about 1.2 cm. This may be due to ischemia of indeterminate age. Clinical correlation is necessary. If recent ischemia suspected further correlation with brain MRI with diffusion imaging is recommended. No definite acute cortical infarction. No mass lesion is noted on this unenhanced scan.  IMPRESSION: No intracranial hemorrhage, mass effect or midline shift. Mild cerebral atrophy.There is small lacunar infarct measures about 5 mm in right thalamus. There is small area of decreased attenuation in right basal ganglia. Measures about 1.2 cm. This may be due to ischemia of indeterminate age. Clinical correlation is necessary. If recent ischemia suspected further correlation with brain MRI with diffusion imaging is recommended. No definite acute cortical infarction.   Electronically Signed   By: Natasha Mead   On: 03/30/2013 13:07   Mr Brain Wo Contrast  03/30/2013   CLINICAL DATA:  Left facial droop.  Difficulty swallowing. Stroke.  EXAM: MRI HEAD WITHOUT CONTRAST  MRA HEAD WITHOUT CONTRAST  TECHNIQUE: Multiplanar, multiecho pulse sequences of  the brain and surrounding structures were obtained without intravenous contrast. Angiographic images of the head were obtained using MRA technique without contrast.  COMPARISON:  CT head without contrast from the same day.  FINDINGS: MRI HEAD FINDINGS  An acute nonhemorrhagic infarct is present within the right putamen extending superiorly into the corona radiata. There is a focal area of restricted diffusion in the left corona radiata as well. Atrophy and extensive white matter disease is advanced for age. Remote lacunar infarcts are present in the basal ganglia bilaterally, more prominent on the right. There are remote lacunar infarcts adjacent to the atrium of the left lateral ventricle. The remote lacunar infarct is present just to the left of midline within the mid brain.  Flow is present in the major intracranial arteries. The globes and orbits are intact. The study is mildly degraded by patient motion. Chronic mucosal thickening is noted in the right sphenoid sinus. There is a polyp or mucous retention cyst at the floor of the left maxillary sinus. The paranasal sinuses and  mastoid air cells are otherwise clear.  MRA HEAD FINDINGS  Moderate stenosis is present in the left cavernous carotid artery. The right internal carotid artery is within normal limits. There is overall decrease caliber of the left A1 and M1 segments compared to the right. The anterior communicating artery is patent. Moderate stenosis is present within the inferior left M2 branch. There is prominent attenuation MCA branch vessels bilaterally, left greater than right. Asymmetric attenuation of distal ACA branch vessels is present as well.  The vertebral arteries are codominant. The basilar artery is within normal limits. Mild narrowing of the proximal basilar artery measures less than 50% relative to the distal vessel. Both posterior cerebral arteries originate from the basilar tip. Segmental irregularity is present within the proximal  posterior cerebral arteries are bilaterally. A high-grade stenosis is present in the left P2 segment with distal attenuation bilaterally.  IMPRESSION: MRI HEAD IMPRESSION  1. Acute nonhemorrhagic infarct of the right putamen and extending superiorly to the corona radiata. 2. Much smaller focal area of acute nonhemorrhagic infarction in the left corona radiata. 3. Age advanced atrophy and diffuse white matter disease likely reflects the sequela of chronic microvascular ischemia. 4. Remote lacunar infarcts of the basal ganglia bilaterally, right greater than left. 5. Remote lacunar infarct of the left paramedian midbrain.  MRA HEAD IMPRESSION  1. 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 2. Proximal inferior M2 branch vessel stenoses bilaterally, left greater than right. 3. Moderate diffuse small vessel disease. 4. Mild narrowing of the proximal basilar artery. 5. High-grade stenosis of the distal left P2 segment. These results were called by telephone at the time of interpretation on 03/30/2013 at 8:17 PM to [facility], RN 4N, who verbally acknowledged these results.   Electronically Signed   By: Gennette Pac   On: 03/30/2013 20:18   Dg Swallowing Func-speech Pathology  03/31/2013   Riley Nearing [place], CCC-SLP     03/31/2013 11:18 AM Objective Swallowing Evaluation: Modified Barium Swallowing Study   Patient Details  Name: Jerry Graham MRN: [id number] Date of Birth: 04/22/1951  Today's Date: 03/31/2013 Time: 1030-1047 SLP Time Calculation (min): 17 min  Past Medical History:  Past Medical History  Diagnosis Date  . Hypertension   . Diabetes mellitus without complication   . Stroke    Past Surgical History: History reviewed. No pertinent past  surgical history. HPI:  62 year old male admitted 03/30/13 with left lower facial  weakness.  PMH significant for CVA, HTN, DM, + tobacco,  difficulty with pills this  morning, failed RNSSS.  CT results  indicate no hemorrhage, mild atrophy, small lacunar infarct in  the right thalamus, decreased attentuation in the right basal  ganglia.     Assessment / Plan / Recommendation Clinical Impression  Dysphagia Diagnosis: Moderate oral phase dysphagia;Mild  pharyngeal phase dysphagia Clinical impression: Pt demonstrates a primary oral phase  dysphagia with left lingual and labial weakness with premature  spillage of boluses and moderate oral residuals after the  swallow. There are also pharyngeal sensory deficits resulting in  penetration/aspration before the swallow with thin liquids with  late sensation and ineficient expectoration. Pt would be able to  tolerate nectar thick liquids except that oral residuals fall to  airway post swallow. Pts cognitive impairments impede timely  adherance to cues and strategies. If pt could tuck chin and  quickly swallow twice without impulsive talking, nectar thick  liquids may be attempted. For now, will recommend dys 3  (mechanical soft) with honey thick liquids until pt demonstrates  improved adherance to strategies. A chin tuck is not particularly  needed with honey thick liquids, but it improves airway closure  for nectar thick liquids.     Treatment Recommendation  Therapy as outlined in treatment plan below    Diet Recommendation Dysphagia 3 (Mechanical Soft);Honey-thick  liquid   Liquid Administration via: Cup Medication Administration: Whole meds with puree Supervision: Full supervision/cueing for compensatory strategies Compensations: Small sips/bites;Check for pocketing;Multiple dry  swallows after each bite/sip;Clear throat intermittently Postural Changes and/or Swallow Maneuvers: Seated upright 90  degrees;Upright 30-60 min after meal;Chin tuck    Other  Recommendations Oral Care Recommendations: Oral care BID Other Recommendations: Remove water pitcher   Follow Up Recommendations  Inpatient Rehab    Frequency and Duration min 2x/week   2 weeks   Pertinent Vitals/Pain NA    SLP Swallow Goals Patient will utilize recommended strategies during swallow to  increase swallowing safety with: Minimal cueing Swallow Study Goal #2 - Progress: Progressing toward goal   General HPI: 62 year old male admitted 03/30/13 with left lower  facial weakness.  PMH significant for CVA, HTN, DM, + tobacco,  difficulty with pills this morning, failed RNSSS.  CT results  indicate no hemorrhage, mild atrophy, small lacunar infarct in  the right thalamus, decreased attentuation in the right basal  ganglia. Type of Study: Modified Barium Swallowing Study Reason for Referral: Objectively evaluate swallowing function Diet Prior to this Study: Dysphagia 2 (chopped);Nectar-thick  liquids Temperature Spikes Noted: No Respiratory Status: Room air History of Recent Intubation: No Behavior/Cognition: Alert;Cooperative;Pleasant mood Oral Cavity - Dentition: Missing dentition Oral Motor / Sensory Function: Impaired - see Bedside swallow  eval Patient Positioning: Upright in chair Baseline Vocal Quality: Wet Volitional Cough: Strong Volitional Swallow: Able to elicit (with effort anc ues) Anatomy: Within functional limits Pharyngeal Secretions: Not observed secondary MBS    Reason for Referral Objectively evaluate swallowing function   Oral Phase Oral Preparation/Oral Phase Oral Phase: Impaired Oral - Honey Oral - Honey Cup: Left anterior bolus loss;Weak lingual  manipulation;Left pocketing in lateral sulci;Lingual/palatal  residue;Delayed oral transit Oral - Nectar Oral - Nectar Cup: Left anterior bolus loss;Weak lingual  manipulation;Left pocketing in lateral sulci;Lingual/palatal  residue;Delayed oral transit Oral - Thin Oral - Thin Cup: Left anterior bolus loss;Weak lingual  manipulation;Left pocketing in lateral sulci;Lingual/palatal  residue;Delayed oral transit Oral - Solids Oral - Puree: Left anterior bolus loss;Weak lingual  manipulation;Left pocketing in lateral  sulci;Lingual/palatal  residue;Delayed oral transit Oral - Regular: Left anterior bolus loss;Weak lingual  manipulation;Left pocketing in lateral sulci;Lingual/palatal  residue;Delayed oral transit Oral - Pill: Left anterior bolus loss;Weak lingual  manipulation;Left pocketing in lateral sulci;Lingual/palatal  residue;Delayed oral transit   Pharyngeal Phase Pharyngeal Phase Pharyngeal Phase: Impaired Pharyngeal - Honey Pharyngeal - Honey Cup: Delayed swallow initiation;Premature  spillage to valleculae Pharyngeal - Nectar Pharyngeal - Nectar Cup: Delayed swallow initiation;Premature  spillage to pyriform sinuses;Penetration/Aspiration after  swallow;Penetration/Aspiration before swallow;Moderate aspiration Penetration/Aspiration details (nectar cup): Material enters  airway, CONTACTS cords and not ejected out;Material enters  airway, passes BELOW cords without attempt by patient to eject  out (silent aspiration);Material does not enter airway Pharyngeal - Thin Pharyngeal - Thin Cup: Delayed swallow initiation;Premature  spillage to pyriform sinuses;Penetration/Aspiration before  swallow;Penetration/Aspiration after swallow;Moderate aspiration Penetration/Aspiration details (thin cup): Material enters  airway, passes BELOW cords and not ejected out despite cough  attempt by  patient Pharyngeal - Solids Pharyngeal - Puree: Delayed swallow initiation Pharyngeal - Regular: Delayed swallow initiation Pharyngeal - Pill: Delayed swallow initiation  Cervical Esophageal Phase    GO    Cervical Esophageal Phase Cervical Esophageal Phase: [facility]        Harlon Ditty, MA CCC-SLP [phone]  Dyanne Iha Riley Nearing 03/31/2013, 11:17 AM    Mr Maxine Glenn Head/brain Wo Cm  03/30/2013   CLINICAL DATA:  Left facial droop.  Difficulty swallowing. Stroke.  EXAM: MRI HEAD WITHOUT CONTRAST  MRA HEAD WITHOUT CONTRAST  TECHNIQUE: Multiplanar, multiecho pulse sequences of the brain and surrounding structures were obtained without intravenous  contrast. Angiographic images of the head were obtained using MRA technique without contrast.  COMPARISON:  CT head without contrast from the same day.  FINDINGS: MRI HEAD FINDINGS  An acute nonhemorrhagic infarct is present within the right putamen extending superiorly into the corona radiata. There is a focal area of restricted diffusion in the left corona radiata as well. Atrophy and extensive white matter disease is advanced for age. Remote lacunar infarcts are present in the basal ganglia bilaterally, more prominent on the right. There are remote lacunar infarcts adjacent to the atrium of the left lateral ventricle. The remote lacunar infarct is present just to the left of midline within the mid brain.  Flow is present in the major intracranial arteries. The globes and orbits are intact. The study is mildly degraded by patient motion. Chronic mucosal thickening is noted in the right sphenoid sinus. There is a polyp or mucous retention cyst at the floor of the left maxillary sinus. The paranasal sinuses and mastoid air cells are otherwise clear.  MRA HEAD FINDINGS  Moderate stenosis is present in the left cavernous carotid artery. The right internal carotid artery is within normal limits. There is overall decrease caliber of the left A1 and M1 segments compared to the right. The anterior communicating artery is patent. Moderate stenosis is present within the inferior left M2 branch. There is prominent attenuation MCA branch vessels bilaterally, left greater than right. Asymmetric attenuation of distal ACA branch vessels is present as well.  The vertebral arteries are codominant. The basilar artery is within normal limits. Mild narrowing of the proximal basilar artery measures less than 50% relative to the distal vessel. Both posterior cerebral arteries originate from the basilar tip. Segmental irregularity is present within the proximal posterior cerebral arteries are bilaterally. A high-grade stenosis is  present in the left P2 segment with distal attenuation bilaterally.  IMPRESSION: MRI HEAD IMPRESSION  1. Acute nonhemorrhagic infarct of the right putamen and extending superiorly to the corona radiata. 2. Much smaller focal area of acute nonhemorrhagic infarction in the left corona radiata. 3. Age advanced atrophy and diffuse white matter disease likely reflects the sequela of chronic microvascular ischemia. 4. Remote lacunar infarcts of the basal ganglia bilaterally, right greater than left. 5. Remote lacunar infarct of the left paramedian midbrain.  MRA HEAD IMPRESSION  1. 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 2. Proximal inferior M2 branch vessel stenoses bilaterally, left greater than right. 3. Moderate diffuse small vessel disease. 4. Mild narrowing of the proximal basilar artery. 5. High-grade stenosis of the distal left P2 segment. These results were called by telephone at the time of interpretation on 03/30/2013 at 8:17 PM to [facility], RN 4N, who verbally acknowledged these results.   Electronically Signed   By: Thayer Ohm  Mattern   On: 03/30/2013 20:18   Medications: I have reviewed the patient's current medications. Scheduled Meds: . aspirin  300 mg Rectal Daily   Or  . aspirin  325 mg Oral Daily  . atorvastatin  80 mg Oral q1800  . enoxaparin (LOVENOX) injection  40 mg Subcutaneous Q24H  . glipiZIDE  5 mg Oral QAC breakfast  . insulin aspart  0-9 Units Subcutaneous TID WC  . metFORMIN  850 mg Oral Q breakfast  . nicotine  14 mg Transdermal Daily   Continuous Infusions:  PRN Meds:.food thickener  Assessment/Plan: Jerry Graham is a 62 y.o. male PMH DM2, HTN, tobacco abuse who presents with a left facial droop.   #Acute ischemic stroke - Patient presented with left facial droop with forehead sparing, dysarthria, hand clumsiness, found to have an acute ischemic right putamen infarct  on MRI. Carotid doppler was wnl. 2D echo showed no clot, but was remarkable for LVEF 30-35% (see problem below). PT, OT, and SLP all evaluated him and the consensus was CIR to improve safety and functional status. However per the rehab admission coordinator he does not meet criteria, they are recommending HH or SNF as needed. Patient is uninsured. - ASA 325 daily - Dysphagia 3 diet with honey thick liquids - Neuro checks q4h - NIH stroke scale per nursing shift  - Consult to social work re: placement - He will need neurology follow up as an outpatient  #Newly diagnosed sCHF - 2D echo showed moderately to severely reduced systolic function, EF 30% to 35%. Akinesis of the basal-midanteroseptal myocardium. Akinesis of the inferior myocardium. Doppler parameters consistent with abnormal left ventricular relaxation (grade 1 diastolic dysfunction). He has no signs of volume overload on exam other than some fine crackles at the lung bases. CXR was wnl, no pulmonary edema. He denies SOB, orthopnea, PND, leg swelling. - After permissive hypertension window, will add beta blocker - Patient will need cardiology follow up as an outpatient  #?Lung mass -  There was a questionable lung mass seen on his CXR from his admission to [facility] 10/04/11-10/06/12, which per the discharge summary was NOT able to be worked up due to the patient leaving AMA. However, in the packet of charts they faxed there is a report from a CT chest w/o contrast dated 10/06/11 that found no lung mass. - Repeat CXR with instructions to specifically look for this mass; yesterday's study was poor quality - If negative, no further work up needed  #HLD - Lipid panel showed LDL 197. He was discharged from [facility] on Zocor last year but did not comply. - Starting atorvastatin 80mg  daily   #Type II diabetes mellitus - A1C 9.5. CBGs 160-260. - Patient has been refusing SSI, so will restart his home metformin and glipizide  - CBG monitoring AC and  QHS - Will need close primary care follow up for diabetes management and education, likely with our [facility]  #Hypertension - SBP 150-160s.  - Holding home lisinopril to allow for permissive hypertension in the setting of acute stroke.  #Tobacco abuse - Patient is an everyday smoker. We discussed the importance of quitting to prevent another stroke. - Nicotine patch - Smoking cessation counseling   #DVT PPX - Subq lovenox, SCDs   Dispo: Disposition is deferred at this time, awaiting improvement of current medical problems.  Anticipated discharge in approximately 1-3 day(s).   The patient does not have a current PCP (No primary provider on file.) and does need an OPC  hospital follow-up appointment after discharge.  The patient does not have transportation limitations that hinder transportation to clinic appointments.  .Services Needed at time of discharge: Y = Yes, Blank = No PT:   OT:   RN:   Equipment:   Other:     LOS: 1 day   Vivi Barrack, MD 03/31/2013, 3:57 PM

## 2013-03-31 NOTE — Progress Notes (Signed)
Pt had a 6 beats run of VTach. Pt asymptomatic. Vital signs stable. MD on-call aware with no new orders. Will continue to monitor pt.

## 2013-03-31 NOTE — H&P (Signed)
  Date: 03/31/2013  Patient name: Jerry Graham  Medical record number: [id number]  Date of birth: 04/22/1951   I have seen and evaluated Jerry Graham and discussed their care with the Residency Team.   Assessment and Plan: I have seen and evaluated the patient as outlined above. I agree with the formulated Assessment and Plan as detailed in the residents' admission note, with the following changes:   1. Acute ischemic stroke, lacunar to right thalamus:  Agree with work up as noted in Dr. Doristine Locks note, MRI/MRA, TTE, CD's, FLP/A1C, SLP eval and aspirin started.  Can also start on statin given acute stroke and previous stroke as noted by patient.  PT/OT pending  2. DM 2: SSI - after speech eval can restart home medications  3. Smoking - encourage cessation  Other issues per resident note.    Inez Catalina, MD 9/25/20144:11 PM

## 2013-03-31 NOTE — Evaluation (Signed)
Speech Language Pathology Evaluation Patient Details Name: Jerry Graham MRN: [id number] DOB: 04/22/1951 Today's Date: 03/31/2013 Time: 1030-1100 SLP Time Calculation (min): 30 min  Problem List:  Patient Active Problem List   Diagnosis Date Noted  . Type II diabetes mellitus 03/30/2013  . Acute ischemic stroke 03/30/2013  . Hypertension 03/30/2013   Past Medical History:  Past Medical History  Diagnosis Date  . Hypertension   . Diabetes mellitus without complication   . Stroke    Past Surgical History: History reviewed. No pertinent past surgical history. HPI:  62 year old male admitted 03/30/13 with left lower facial weakness.  PMH significant for CVA, HTN, DM, + tobacco, difficulty with pills this morning, failed RNSSS.  CT results indicate no hemorrhage, mild atrophy, small lacunar infarct in the right thalamus, decreased attentuation in the right basal ganglia.   Assessment / Plan / Recommendation Clinical Impression  Cognitive Lingusitic evalaution completed before, during and after MBS. Pt with moderate dysarthria due to left lingual and labial weakness. Resonance is hypernasal due to VPI. Language is functional but cognition is significantly impaired. Pt with poor selective attention, easily distracted by environment. He has poor emergent, functional awareness of deficits and is impulsive. He was asked to describe physical deficits, he reported hands and legs are fine. Then he stepped out of MBS chair, nearly falling due to weakness in left leg. He then states, "Well I do lean to that side some." Despite verbalized awareness of dysphagia he struggles to follow commands for compensatory strategies, becoming easily distracted and talking as he aspirates, despite cues. Overall pt is a safety risk and needs 24 hour supervision due to cognitive deficits. Would continue to recommend CIR at d/c. If truly not an option pt will need SNF.     SLP Assessment  Patient needs continued Speech  Lanaguage Pathology Services    Follow Up Recommendations  Inpatient Rehab    Frequency and Duration min 2x/week  2 weeks   Pertinent Vitals/Pain NA   SLP Goals  SLP Goals Potential to Achieve Goals: Good SLP Goal #1: Pt will demosntrate emergent awareness of deficits during functional task with moderate contextual cues x3. SLP Goal #1 - Progress: Progressing toward goal SLP Goal #2: Pt will sustain attention to task in moderately distracting environment with min verbal cues to redirect over 5 minutes.  SLP Goal #2 - Progress: Progressing toward goal  SLP Evaluation Prior Functioning  Cognitive/Linguistic Baseline: Within functional limits Type of Home: Apartment  Lives With: Alone Available Help at Discharge: Family;Available PRN/intermittently   Cognition  Overall Cognitive Status: Impaired/Different from baseline Arousal/Alertness: Awake/alert Orientation Level: Oriented X4 Attention: Focused;Sustained;Selective Focused Attention: Appears intact Sustained Attention: Appears intact Selective Attention: Impaired Selective Attention Impairment: Verbal complex;Functional complex Memory: Impaired Memory Impairment: Prospective memory Awareness: Impaired Awareness Impairment: Emergent impairment;Anticipatory impairment Problem Solving: Impaired Problem Solving Impairment: Verbal complex;Functional complex Executive Function: Reasoning;Self Monitoring;Self Correcting Reasoning: Impaired Reasoning Impairment: Verbal complex;Functional complex Self Monitoring: Impaired Self Monitoring Impairment: Verbal complex;Functional complex Self Correcting: Impaired Self Correcting Impairment: Verbal complex;Functional complex Behaviors: Impulsive Safety/Judgment: Impaired    Comprehension  Auditory Comprehension Overall Auditory Comprehension: Appears within functional limits for tasks assessed    Expression Verbal Expression Overall Verbal Expression: Appears within functional  limits for tasks assessed   Oral / Motor Oral Motor/Sensory Function Overall Oral Motor/Sensory Function: Impaired Labial ROM: Reduced left Labial Symmetry: Abnormal symmetry left Labial Strength: Reduced Labial Sensation: Reduced Lingual ROM: Reduced right Lingual Symmetry: Abnormal symmetry left Lingual Strength:  Reduced Lingual Sensation: Reduced Facial ROM: Reduced left Facial Symmetry: Left droop;Left drooping eyelid;Right drooping eyelid Facial Strength: Reduced Facial Sensation: Reduced Velum: Impaired left Mandible: Within Functional Limits Motor Speech Overall Motor Speech: Impaired Respiration: Within functional limits Phonation: Wet Resonance: Hypernasality Articulation: Impaired Level of Impairment: Sentence Intelligibility: Intelligible Motor Planning: Witnin functional limits Motor Speech Errors: Aware Effective Techniques: Over-articulate;Slow rate;Increased vocal intensity   GO    Harlon Ditty, MA CCC-SLP [phone]  Claudine Mouton 03/31/2013, 11:30 AM

## 2013-03-31 NOTE — Evaluation (Signed)
Occupational Therapy Evaluation Patient Details Name: Jerry Graham MRN: [id number] DOB: 04/22/1951 Today's Date: 03/31/2013 Time: 7829-5621 OT Time Calculation (min): 21 min  OT Assessment / Plan / Recommendation History of present illness  62 year old male admitted 03/30/13 with left lower facial weakness. PMH significant for CVA, HTN, DM, + tobacco, difficulty with pills this morning, failed RNSSS. CT results indicate no hemorrhage, mild atrophy, small lacunar infarct in the right thalamus, decreased attentuation in the right basal ganglia. MRI (+) Acute nonhemorrhagic infarct of the right putamen and extending superiorly to the corona radiata. 2. Much smaller focal area of acute nonhemorrhagic infarction in the left corona radiata.3. Remote lacunar infarcts of the basal ganglia bilaterally, right greater than left. 4. Remote lacunar infarct of the left paramedian midbrain.    Clinical Impression   PT admitted with s/p CVA. Pt currently with functional limitiations due to the deficits listed below (see OT problem list).  Pt will benefit from skilled OT to increase their independence and safety with adls and balance to allow discharge CIR. Pt demonstrates cognition and balance deficits. Pt could reach MOD I with admission to CIR.     OT Assessment  Patient needs continued OT Services    Follow Up Recommendations  CIR    Barriers to Discharge Decreased caregiver support    Equipment Recommendations  Other (comment) (seat for tub- undetermined if pt agrees)    Recommendations for Other Services Rehab consult  Frequency  Min 2X/week    Precautions / Restrictions Precautions Precautions: Fall   Pertinent Vitals/Pain No pain- wants to stop spitting/ drooling    ADL  Eating/Feeding: Min guard (cues for chin tuck) Where Assessed - Eating/Feeding: Chair (poor recall of functionally using speech recommendation) Grooming: Teeth care;Min guard Where Assessed - Grooming: Unsupported  standing (needed tactile input for neck position to prevent aspiration) Lower Body Dressing: Minimal assistance Where Assessed - Lower Body Dressing: Unsupported sit to stand Toilet Transfer: Min Pension scheme manager Method: Sit to Barista: Regular height toilet Tub/Shower Transfer: Maximal assistance Tub/Shower Transfer Method: Ambulating Equipment Used: Gait belt Transfers/Ambulation Related to ADLs: Pt ambulating from 4n15 room to gym with reports of dizziness briefly with walking. pt able to turn head to the right and walk with decr speed. Pt reports automatic dizziness with neck flexion looking at floor. pt reaching out for walk and states "oh I dont know what happen there" Pt with same reaction x3 during session. Pt able to perform neck extension without this effect. Pt drifting to the right with neck rotation left. Pt walks with a swagger gait at baseline and its more pronounced with LT side weakness.  ADL Comments: Pt eager to participate and using the yonker in room constantly. Pt asked why are you suctioning your mouth so much. Pt states to get all the moisture. pt talking to therapist and coughing due to inability to manage secretions. Pt with noticeable left mouth droop. Pt performed oral care no coughing and with contant tactile cue for neck flexion to keep risk of aspiration decr. Pt completed a tub transfer catching left le on tub surface and falling into the tub. Pt catching left le exiting tub with max (A) to correct. pt completing this transfer prior to OT cueing and slightly impulsive. Pt educated on correct sequence of strong leg in. Pt remains with deficits. Pt could benefit from CIR to reach a independent level of care. Pt is unable to static stand with eyes closed (occlusion)  OT Diagnosis: Generalized weakness;Cognitive deficits  OT Problem List: Decreased strength;Decreased activity tolerance;Impaired balance (sitting and/or standing);Decreased safety  awareness;Decreased knowledge of use of DME or AE;Decreased knowledge of precautions;Decreased cognition OT Treatment Interventions: Self-care/ADL training;Therapeutic exercise;Neuromuscular education;DME and/or AE instruction;Therapeutic activities;Patient/family education;Balance training;Cognitive remediation/compensation   OT Goals(Current goals can be found in the care plan section) Acute Rehab OT Goals Patient Stated Goal: to go to a dinner club- you eat then go dance OT Goal Formulation: With patient Time For Goal Achievement: 04/14/13 Potential to Achieve Goals: Good ADL Goals Pt Will Perform Tub/Shower Transfer: Tub transfer;with supervision;ambulating Additional ADL Goal #1: Pt will complete 2 step command to complete adl task Additional ADL Goal #2: Pt will savenger hunt ( locate 3 objects on unit) with only being given clues to problem solve MOD I  Visit Information  Last OT Received On: 03/31/13 Assistance Needed: +1 History of Present Illness:  62 year old male admitted 03/30/13 with left lower facial weakness. PMH significant for CVA, HTN, DM, + tobacco, difficulty with pills this morning, failed RNSSS. CT results indicate no hemorrhage, mild atrophy, small lacunar infarct in the right thalamus, decreased attentuation in the right basal ganglia. MRI (+) Acute nonhemorrhagic infarct of the right putamen and extending superiorly to the corona radiata. 2. Much smaller focal area of acute nonhemorrhagic infarction in the left corona radiata.3. Remote lacunar infarcts of the basal ganglia bilaterally, right greater than left. 4. Remote lacunar infarct of the left paramedian midbrain.        Prior Functioning     Home Living Family/patient expects to be discharged to:: Private residence Living Arrangements: Alone Available Help at Discharge: Family;Available PRN/intermittently Type of Home: Apartment Home Access: Level entry Home Layout: One level Home Equipment: None  Lives  With: Alone Prior Function Level of Independence: Independent Communication Communication:  (slured speech) Dominant Hand: Right         Vision/Perception Vision - History Baseline Vision: Wears glasses only for reading Patient Visual Report: No change from baseline   Cognition  Cognition Arousal/Alertness: Awake/alert Behavior During Therapy: WFL for tasks assessed/performed Overall Cognitive Status: Impaired/Different from baseline Area of Impairment: Memory;Following commands;Safety/judgement;Problem solving;Awareness Memory: Decreased recall of precautions Following Commands: Follows multi-step commands with increased time;Follows multi-step commands inconsistently Safety/Judgement: Decreased awareness of safety Awareness: Anticipatory Problem Solving: Slow processing;Difficulty sequencing    Extremity/Trunk Assessment Upper Extremity Assessment Upper Extremity Assessment: LUE deficits/detail LUE Deficits / Details: LT UE drift with shoulder flexion  4 out 5, decr motor sequence, decr ability to complete finger to nose  LUE Sensation:  (reports sensation normal) LUE Coordination: decreased fine motor Lower Extremity Assessment Lower Extremity Assessment: Defer to PT evaluation (pt walks at baseline with a swagger) Cervical / Trunk Assessment Cervical / Trunk Assessment: Normal     Mobility Bed Mobility Bed Mobility: Not assessed Transfers Sit to Stand: 4: Min guard;With upper extremity assist;From chair/3-in-1 Stand to Sit: 4: Min guard;With upper extremity assist;To chair/3-in-1     Exercise     Balance     End of Session OT - End of Session Activity Tolerance: Patient tolerated treatment well Patient left: in chair;with call bell/phone within reach Nurse Communication: Mobility status;Precautions  GO     Harolyn Rutherford 03/31/2013, 1:25 PM Pager: [phone]

## 2013-03-31 NOTE — Progress Notes (Signed)
Rehab Admissions Coordinator Note:  Patient was screened by Trish Mage for appropriateness for an Inpatient Acute Rehab Consult. Noted PT recommending CIR.   At this time, we are recommending HH or SNF as needed.  Patient is already doing too well at this point to meet criteria for acute inpatient rehab admission.  Lelon Frohlich M 03/31/2013, 10:06 AM  I can be reached at [phone].

## 2013-03-31 NOTE — Evaluation (Signed)
Physical Therapy Evaluation Patient Details Name: Jerry Graham MRN: [id number] DOB: 04/22/1951 Today's Date: 03/31/2013 Time: 7829-5621 PT Time Calculation (min): 17 min  PT Assessment / Plan / Recommendation History of Present Illness  62 year old male admitted 03/30/13 with left lower facial weakness.  PMH significant for CVA, HTN, DM, + tobacco, difficulty with pills this morning, failed RNSSS.  CT results indicate no hemorrhage, mild atrophy, small lacunar infarct in the right thalamus, decreased attentuation in the right basal ganglia.   Clinical Impression  Pt admitted with L lower facial weakness and CVA. Pt currently with functional limitations due to the deficits listed below (see PT Problem List). Pt able to ambulate 400' today without AD and min guard to supervision. Pt has decreased safety awareness and is unable to tell when L foot heel strike is decreased due to fatigue during amb. Pt lives alone and states family is occasionally available. PT recommends CIR to improve safety and functional mobility. Pt will benefit from skilled PT to increase their independence and safety with mobility to allow discharge.    PT Assessment  Patient needs continued PT services    Follow Up Recommendations  CIR;Supervision/Assistance - 24 hour    Does the patient have the potential to tolerate intense rehabilitation      Barriers to Discharge Decreased caregiver support      Equipment Recommendations  Other (comment) (TBD: possibly SPC)    Recommendations for Other Services Rehab consult   Frequency Min 4X/week    Precautions / Restrictions Precautions Precautions: Fall Restrictions Weight Bearing Restrictions: No   Pertinent Vitals/Pain No c/o of pain today.      Mobility  Bed Mobility Bed Mobility: Not assessed Details for Bed Mobility Assistance: Pt in bathroom with RN upon arrival. Transfers Transfers: Sit to Stand;Stand to Sit Sit to Stand: 5: Supervision;From bed;With  upper extremity assist Stand to Sit: 5: Supervision;With armrests;With upper extremity assist;To chair/3-in-1;To bed Details for Transfer Assistance: Supervision to ensure safety. Ambulation/Gait Ambulation/Gait Assistance: 4: Min guard Ambulation Distance (Feet): 400 Feet Assistive device: None Ambulation/Gait Assistance Details: Pt began ambulating without AD and supervision but required min guard during last 100' due to decreased safety awareness, impaired balance and decreased L foot DF due to fatigue. VC's to negotiate objects in hallway. Gait Pattern: Step-through pattern;Decreased dorsiflexion - left;Wide base of support;Decreased stride length Gait velocity: Decreased during last 200' of amb.  General Gait Details: PT emphasized the importance of calling RN to return to bed and to always walk with nsg due to impaired balance and LLE weakness. Stairs: No Modified Rankin (Stroke Patients Only) Pre-Morbid Rankin Score: No symptoms Modified Rankin: Moderately severe disability    Exercises     PT Diagnosis: Difficulty walking;Generalized weakness  PT Problem List: Decreased strength;Decreased activity tolerance;Decreased balance;Decreased mobility;Decreased knowledge of use of DME;Decreased safety awareness PT Treatment Interventions: DME instruction;Gait training;Functional mobility training;Therapeutic activities;Therapeutic exercise;Balance training;Neuromuscular re-education;Patient/family education (DME prn)     PT Goals(Current goals can be found in the care plan section) Acute Rehab PT Goals Patient Stated Goal: to stop this "spitting" and get my L leg stronger. PT Goal Formulation: With patient Time For Goal Achievement: 04/14/13 Potential to Achieve Goals: Good  Visit Information  Last PT Received On: 03/31/13 Assistance Needed: +1 History of Present Illness: 62 year old male admitted 03/30/13 with left lower facial weakness.  PMH significant for CVA, HTN, DM, + tobacco,  difficulty with pills this morning, failed RNSSS.  CT results indicate no hemorrhage, mild  atrophy, small lacunar infarct in the right thalamus, decreased attentuation in the right basal ganglia.        Prior Functioning  Home Living Family/patient expects to be discharged to:: Private residence Living Arrangements: Alone Available Help at Discharge: Family;Available PRN/intermittently Type of Home: Apartment Home Access: Level entry Home Layout: One level Home Equipment: None Prior Function Level of Independence: Independent Comments: Pt reports he takes 1-2 hour walks daily. Communication Communication: Expressive difficulties (Slurred speech.)    Cognition  Cognition Arousal/Alertness: Awake/alert Behavior During Therapy: WFL for tasks assessed/performed Overall Cognitive Status: Impaired/Different from baseline Area of Impairment: Following commands;Safety/judgement Following Commands: Follows multi-step commands inconsistently Safety/Judgement: Decreased awareness of safety    Extremity/Trunk Assessment Lower Extremity Assessment Lower Extremity Assessment: LLE deficits/detail (RLE MMT: all muscle groups 5/5.) LLE Deficits / Details: MMT: hip flex: 3+/5, knee ext: 4/5, knee flex: 3+5, DF: 4/5.  No c/o numbness/tingling in LEs.   Balance Balance Balance Assessed: Yes Static Standing Balance Single Leg Stance - Right Leg: 2 Single Leg Stance - Left Leg: 7 High Level Balance High Level Balance Activites: Other (comment) (Bending down.) High Level Balance Comments: Pt able to pick up washcloth from floor during amb. with supervision and no LOB.  End of Session PT - End of Session Activity Tolerance: Patient tolerated treatment well (Pt perseverating on drooling from mouth during session.) Patient left: in chair;with nursing/sitter in room;with call bell/phone within reach  GP     Jerry Graham 03/31/2013, 9:27 AM

## 2013-03-31 NOTE — Progress Notes (Signed)
  Echocardiogram 2D Echocardiogram has been performed.  Cathie Beams 03/31/2013, 9:51 AM

## 2013-03-31 NOTE — Progress Notes (Signed)
Radiologist called this RN about the MRI result. MD on-call paged and informed about the MRI result. Pt also refusing heparin SQ. MD on-call aware and orders made. Will continue to monitor pt.

## 2013-03-31 NOTE — Progress Notes (Signed)
VASCULAR LAB PRELIMINARY  PRELIMINARY  PRELIMINARY  PRELIMINARY  Carotid duplex completed.    Preliminary report:  Bilateral:  1-39% ICA stenosis.  Vertebral artery flow is antegrade.      Zenia Guest, RVT 03/31/2013, 10:10 AM

## 2013-04-01 DIAGNOSIS — I10 Essential (primary) hypertension: Secondary | ICD-10-CM

## 2013-04-01 DIAGNOSIS — I519 Heart disease, unspecified: Secondary | ICD-10-CM

## 2013-04-01 LAB — GLUCOSE, CAPILLARY: Glucose-Capillary: 181 mg/dL — ABNORMAL HIGH (ref 70–99)

## 2013-04-01 LAB — HIV ANTIBODY (ROUTINE TESTING W REFLEX): HIV: NONREACTIVE

## 2013-04-01 MED ORDER — LISINOPRIL 20 MG PO TABS
20.0000 mg | ORAL_TABLET | Freq: Every day | ORAL | Status: DC
  Administered 2013-04-01 – 2013-04-04 (×4): 20 mg via ORAL
  Filled 2013-04-01 (×4): qty 1

## 2013-04-01 MED ORDER — CARVEDILOL 3.125 MG PO TABS
3.1250 mg | ORAL_TABLET | Freq: Two times a day (BID) | ORAL | Status: DC
  Administered 2013-04-01 – 2013-04-03 (×4): 3.125 mg via ORAL
  Filled 2013-04-01 (×6): qty 1

## 2013-04-01 NOTE — Progress Notes (Signed)
  Date: 04/01/2013  Patient name: Jerry Graham  Medical record number: [id number]  Date of birth: 10-30-1950   This patient has been seen and the plan of care was discussed with the house staff. Please see their note for complete details. I concur with their findings.  Inez Catalina, MD 04/01/2013, 4:51 PM

## 2013-04-01 NOTE — Consult Note (Signed)
Advanced Heart Failure Team Consult Note  Referring Physician: Dr Dorian Furnace Primary Physician: None Primary Cardiologist:  None  Reason for Consultation: New Acute Systolic Heart Failure  HPI:   The Heart Failure Team was asked provide a consult by Dr Dorian Furnace for new onset LV dysfunction  [name] is a 62 y.o. male PMH DM2, HTN, CVA, and tobacco abuse. Denies known CAD or HF.   Admitted to [facility] 10/04/11 through 10/07/11 for left-sided facial droop, found to have a right internal capsule infarct on MRI. Patient left AMA before a full workup (including 2D echo) could be performed. He discharged on zocor and aspirin but was noncompliant.   Recently relocated to [place] from [place] about 5 months ago. Admitted on 9/24 with facial droop. Found to have  Lacunar infarct of right thalamus. As part of the work up an ECHO was performed with EF reduced to 30-35 % Akinesis of the basal-midanteroseptal myocardium. Akinesis of the inferior myocardium. He denies CP, DOE, orthopnea or PND.   Carotid DopplerBilateral: 1-39% ICA stenosis.   ECG: Sinus rhythm with probable old anterior MI. LVH with repol changes   Ref. Range 03/31/2013 06:20  Cholesterol Latest Range: 0-200 mg/dL 161 (H)  Triglycerides Latest Range: <150 mg/dL 096 (H)  HDL Latest Range: >39 mg/dL 38 (L)  LDL (calc) Latest Range: 0-99 mg/dL 045 (H)  VLDL Latest Range: 0-40 mg/dL 40    Review of Systems: [y] = yes, [ ]  = no   General: Weight gain [ ] ; Weight loss [ ] ; Anorexia [ ] ; Fatigue [Y ]; Fever [ ] ; Chills [ ] ; Weakness [ ]   Cardiac: Chest pain/pressure [ ] ; Resting SOB [ ] ; Exertional SOB [ ] ; Orthopnea [ ] ; Pedal Edema [ ] ; Palpitations [ ] ; Syncope [ ] ; Presyncope [ ] ; Paroxysmal nocturnal dyspnea[ ]   Pulmonary: Cough [ ] ; Wheezing[ ] ; Hemoptysis[ ] ; Sputum [ ] ; Snoring [ ]   GI: Vomiting[ ] ; Dysphagia[ ] ; Melena[ ] ; Hematochezia [ ] ; Heartburn[ ] ; Abdominal pain [ ] ; Constipation [ ] ; Diarrhea [ ] ; BRBPR [ ]    GU: Hematuria[ ] ; Dysuria [ ] ; Nocturia[ ]   Vascular: Pain in legs with walking [ ] ; Pain in feet with lying flat [ ] ; Non-healing sores [ ] ; Stroke [Y ]; TIA [ ] ; Slurred speech [ Y];  Neuro: Headaches[ ] ; Vertigo[ ] ; Seizures[ ] ; Paresthesias[ ] ;Blurred vision [ ] ; Diplopia [ ] ; Vision changes [ ]   Ortho/Skin: Arthritis [ ] ; Joint pain [ ] ; Muscle pain [ ] ; Joint swelling [ ] ; Back Pain [ ] ; Rash [ ]   Psych: Depression[ ] ; Anxiety[ ]   Heme: Bleeding problems [ ] ; Clotting disorders [ ] ; Anemia [ ]   Endocrine: Diabetes [Y ]; Thyroid dysfunction[ ]   Home Medications Prior to Admission medications   Medication Sig Start Date End Date Taking? Authorizing Provider  glipiZIDE (GLUCOTROL) 5 MG tablet Take 5 mg by mouth daily.   Yes Historical Provider, MD  lisinopril (PRINIVIL,ZESTRIL) 20 MG tablet Take 20 mg by mouth daily.   Yes Historical Provider, MD  metFORMIN (GLUCOPHAGE) 850 MG tablet Take 850 mg by mouth daily.   Yes Historical Provider, MD   Scheduled Meds: . aspirin  300 mg Rectal Daily   Or  . aspirin  325 mg Oral Daily  . atorvastatin  80 mg Oral q1800  . enoxaparin (LOVENOX) injection  40 mg Subcutaneous Q24H  . glipiZIDE  5 mg Oral QAC breakfast  . insulin aspart  0-9 Units Subcutaneous TID WC  .  metFORMIN  850 mg Oral Q breakfast  . nicotine  14 mg Transdermal Daily  :  PRN Meds:.food thickener  Past Medical History: Past Medical History  Diagnosis Date  . Hypertension   . Diabetes mellitus without complication   . Stroke     Past Surgical History: History reviewed. No pertinent past surgical history.  Family History: History reviewed. No pertinent family history.  Social History: History   Social History  . Marital Status: Single    Spouse Name: N/A    Number of Children: N/A  . Years of Education: N/A   Social History Main Topics  . Smoking status: Current Every Day Smoker  . Smokeless tobacco: None  . Alcohol Use: No  . Drug Use: No  . Sexual  Activity: None   Other Topics Concern  . None   Social History Narrative  . None    Allergies:  No Known Allergies  Objective:    Vital Signs:   Temp:  [97.4 F (36.3 C)-98.3 F (36.8 C)] 97.4 F (36.3 C) (09/26 0527) Pulse Rate:  [72-92] 74 (09/26 0527) Resp:  [18-20] 20 (09/26 0527) BP: (157-175)/(80-99) 175/99 mmHg (09/26 0527) SpO2:  [94 %-98 %] 96 % (09/26 0527) Last BM Date: 03/30/13  Weight change: Filed Weights   03/30/13 1545  Weight: 178 lb 1.6 oz (80.786 kg)    Intake/Output:  No intake or output data in the 24 hours ending 04/01/13 0859   Physical Exam: General:  Well appearing. Sitting in chair. No resp difficulty HEENT: L facial droop  Neck: supple. JVP flat. Carotids 2+ bilat; no bruits. No lymphadenopathy or thryomegaly appreciated. Cor: PMI nondisplaced. Regular rate & rhythm. No rubs, gallops or murmurs. Lungs: clear Abdomen: soft, nontender, nondistended. No hepatosplenomegaly. No bruits or masses. Good bowel sounds. Extremities: no cyanosis, clubbing, rash, edema Neuro: alert & orientedx3, cranial nerves grossly intact. moves all 4 extremities w/o difficulty. Affect pleasant  Telemetry: SR. No AF  Labs: Basic Metabolic Panel:  Recent Labs Lab 03/30/13 1141  NA 138  K 4.2  CL 100  CO2 27  GLUCOSE 231*  BUN 10  CREATININE 0.75  CALCIUM 9.7    Liver Function Tests: No results found for this basename: AST, ALT, ALKPHOS, BILITOT, PROT, ALBUMIN,  in the last 168 hours No results found for this basename: LIPASE, AMYLASE,  in the last 168 hours No results found for this basename: AMMONIA,  in the last 168 hours  CBC:  Recent Labs Lab 03/30/13 1141  WBC 6.3  NEUTROABS 2.5  HGB 16.7  HCT 48.4  MCV 81.9  PLT 219    Cardiac Enzymes: No results found for this basename: CKTOTAL, CKMB, CKMBINDEX, TROPONINI,  in the last 168 hours  BNP: BNP (last 3 results) No results found for this basename: PROBNP,  in the last 8760  hours  CBG:  Recent Labs Lab 03/31/13 0716 03/31/13 1135 03/31/13 1641 03/31/13 2152 04/01/13 0736  GLUCAP 197* 260* 132* 140* 181*    Coagulation Studies: No results found for this basename: LABPROT, INR,  in the last 72 hours  Other results:   Imaging: Dg Chest 2 View  03/31/2013   CLINICAL DATA:  Possible lung mass.  EXAM: CHEST  2 VIEW  COMPARISON:  PA and lateral chest 03/30/2013.  FINDINGS: No pulmonary nodule or mass is identified. The lungs appear clear. Heart size is normal. No pneumothorax or pleural fluid. Remote distal right clavicle fracture is identified.  IMPRESSION: Negative for evidence of  neoplasm. No acute finding.   Electronically Signed   By: Drusilla Kanner M.D.   On: 03/31/2013 22:44   Dg Chest 2 View  03/30/2013   CLINICAL DATA:  Stroke  EXAM: CHEST  2 VIEW  COMPARISON:  None.  FINDINGS: Cardiomediastinal silhouette is unremarkable. Elevation of the right hemidiaphragm is noted. No acute infiltrate or pulmonary edema. Bony thorax is unremarkable.  IMPRESSION: No active cardiopulmonary disease. Elevation of the right hemidiaphragm.   Electronically Signed   By: Natasha Mead   On: 03/30/2013 20:12   Ct Graham Wo Contrast  03/30/2013   CLINICAL DATA:  Headache, vertigo  EXAM: CT Graham WITHOUT CONTRAST  TECHNIQUE: Contiguous axial images were obtained from the base of the skull through the vertex without intravenous contrast.  COMPARISON:  None.  FINDINGS: No skull fracture is noted. Paranasal sinuses and mastoid air cells are unremarkable.  No intracranial hemorrhage, mass effect or midline shift. Mild cerebral atrophy. Mild periventricular and patchy subcortical white matter decreased attenuation probable due to chronic small vessel ischemic changes. There is small lacunar infarct measures about 5 mm in right thalamus. There is small area of decreased attenuation in right basal ganglia. Measures about 1.2 cm. This may be due to ischemia of indeterminate age. Clinical  correlation is necessary. If recent ischemia suspected further correlation with brain MRI with diffusion imaging is recommended. No definite acute cortical infarction. No mass lesion is noted on this unenhanced scan.  IMPRESSION: No intracranial hemorrhage, mass effect or midline shift. Mild cerebral atrophy.There is small lacunar infarct measures about 5 mm in right thalamus. There is small area of decreased attenuation in right basal ganglia. Measures about 1.2 cm. This may be due to ischemia of indeterminate age. Clinical correlation is necessary. If recent ischemia suspected further correlation with brain MRI with diffusion imaging is recommended. No definite acute cortical infarction.   Electronically Signed   By: Natasha Mead   On: 03/30/2013 13:07   Mr Brain Wo Contrast  03/30/2013   CLINICAL DATA:  Left facial droop.  Difficulty swallowing. Stroke.  EXAM: MRI Graham WITHOUT CONTRAST  MRA Graham WITHOUT CONTRAST  TECHNIQUE: Multiplanar, multiecho pulse sequences of the brain and surrounding structures were obtained without intravenous contrast. Angiographic images of the Graham were obtained using MRA technique without contrast.  COMPARISON:  CT Graham without contrast from the same day.  FINDINGS: MRI Graham FINDINGS  An acute nonhemorrhagic infarct is present within the right putamen extending superiorly into the corona radiata. There is a focal area of restricted diffusion in the left corona radiata as well. Atrophy and extensive white matter disease is advanced for age. Remote lacunar infarcts are present in the basal ganglia bilaterally, more prominent on the right. There are remote lacunar infarcts adjacent to the atrium of the left lateral ventricle. The remote lacunar infarct is present just to the left of midline within the mid brain.  Flow is present in the major intracranial arteries. The globes and orbits are intact. The study is mildly degraded by patient motion. Chronic mucosal thickening is noted in the  right sphenoid sinus. There is a polyp or mucous retention cyst at the floor of the left maxillary sinus. The paranasal sinuses and mastoid air cells are otherwise clear.  MRA Graham FINDINGS  Moderate stenosis is present in the left cavernous carotid artery. The right internal carotid artery is within normal limits. There is overall decrease caliber of the left A1 and M1 segments compared to the right. The  anterior communicating artery is patent. Moderate stenosis is present within the inferior left M2 branch. There is prominent attenuation MCA branch vessels bilaterally, left greater than right. Asymmetric attenuation of distal ACA branch vessels is present as well.  The vertebral arteries are codominant. The basilar artery is within normal limits. Mild narrowing of the proximal basilar artery measures less than 50% relative to the distal vessel. Both posterior cerebral arteries originate from the basilar tip. Segmental irregularity is present within the proximal posterior cerebral arteries are bilaterally. A high-grade stenosis is present in the left P2 segment with distal attenuation bilaterally.  IMPRESSION: MRI Graham IMPRESSION  1. Acute nonhemorrhagic infarct of the right putamen and extending superiorly to the corona radiata. 2. Much smaller focal area of acute nonhemorrhagic infarction in the left corona radiata. 3. Age advanced atrophy and diffuse white matter disease likely reflects the sequela of chronic microvascular ischemia. 4. Remote lacunar infarcts of the basal ganglia bilaterally, right greater than left. 5. Remote lacunar infarct of the left paramedian midbrain.  MRA Graham IMPRESSION  1. 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 2. Proximal inferior M2 branch vessel stenoses bilaterally, left greater than right. 3. Moderate diffuse small vessel disease. 4. Mild narrowing of the proximal basilar  artery. 5. High-grade stenosis of the distal left P2 segment. These results were called by telephone at the time of interpretation on 03/30/2013 at 8:17 PM to [facility], RN 4N, who verbally acknowledged these results.   Electronically Signed   By: Gennette Pac   On: 03/30/2013 20:18   Dg Swallowing Func-speech Pathology  03/31/2013   Riley Nearing [place]blois, CCC-SLP     03/31/2013 11:18 AM Objective Swallowing Evaluation: Modified Barium Swallowing Study   Patient Details  Name: Jerry Graham MRN: [id number] Date of Birth: 04/22/1951  Today's Date: 03/31/2013 Time: 1030-1047 SLP Time Calculation (min): 17 min  Past Medical History:  Past Medical History  Diagnosis Date  . Hypertension   . Diabetes mellitus without complication   . Stroke    Past Surgical History: History reviewed. No pertinent past  surgical history. HPI:  62 year old male admitted 03/30/13 with left lower facial  weakness.  PMH significant for CVA, HTN, DM, + tobacco,  difficulty with pills this morning, failed RNSSS.  CT results  indicate no hemorrhage, mild atrophy, small lacunar infarct in  the right thalamus, decreased attentuation in the right basal  ganglia.     Assessment / Plan / Recommendation Clinical Impression  Dysphagia Diagnosis: Moderate oral phase dysphagia;Mild  pharyngeal phase dysphagia Clinical impression: Pt demonstrates a primary oral phase  dysphagia with left lingual and labial weakness with premature  spillage of boluses and moderate oral residuals after the  swallow. There are also pharyngeal sensory deficits resulting in  penetration/aspration before the swallow with thin liquids with  late sensation and ineficient expectoration. Pt would be able to  tolerate nectar thick liquids except that oral residuals fall to  airway post swallow. Pts cognitive impairments impede timely  adherance to cues and strategies. If pt could tuck chin and  quickly swallow twice without impulsive talking, nectar thick  liquids may be attempted.  For now, will recommend dys 3  (mechanical soft) with honey thick liquids until pt demonstrates  improved adherance to strategies. A chin tuck is not particularly  needed with honey thick liquids, but it improves airway closure  for nectar thick  liquids.     Treatment Recommendation  Therapy as outlined in treatment plan below    Diet Recommendation Dysphagia 3 (Mechanical Soft);Honey-thick  liquid   Liquid Administration via: Cup Medication Administration: Whole meds with puree Supervision: Full supervision/cueing for compensatory strategies Compensations: Small sips/bites;Check for pocketing;Multiple dry  swallows after each bite/sip;Clear throat intermittently Postural Changes and/or Swallow Maneuvers: Seated upright 90  degrees;Upright 30-60 min after meal;Chin tuck    Other  Recommendations Oral Care Recommendations: Oral care BID Other Recommendations: Remove water pitcher   Follow Up Recommendations  Inpatient Rehab    Frequency and Duration min 2x/week  2 weeks   Pertinent Vitals/Pain NA    SLP Swallow Goals Patient will utilize recommended strategies during swallow to  increase swallowing safety with: Minimal cueing Swallow Study Goal #2 - Progress: Progressing toward goal   General HPI: 62 year old male admitted 03/30/13 with left lower  facial weakness.  PMH significant for CVA, HTN, DM, + tobacco,  difficulty with pills this morning, failed RNSSS.  CT results  indicate no hemorrhage, mild atrophy, small lacunar infarct in  the right thalamus, decreased attentuation in the right basal  ganglia. Type of Study: Modified Barium Swallowing Study Reason for Referral: Objectively evaluate swallowing function Diet Prior to this Study: Dysphagia 2 (chopped);Nectar-thick  liquids Temperature Spikes Noted: No Respiratory Status: Room air History of Recent Intubation: No Behavior/Cognition: Alert;Cooperative;Pleasant mood Oral Cavity - Dentition: Missing dentition Oral Motor / Sensory Function: Impaired - see Bedside  swallow  eval Patient Positioning: Upright in chair Baseline Vocal Quality: Wet Volitional Cough: Strong Volitional Swallow: Able to elicit (with effort anc ues) Anatomy: Within functional limits Pharyngeal Secretions: Not observed secondary MBS    Reason for Referral Objectively evaluate swallowing function   Oral Phase Oral Preparation/Oral Phase Oral Phase: Impaired Oral - Honey Oral - Honey Cup: Left anterior bolus loss;Weak lingual  manipulation;Left pocketing in lateral sulci;Lingual/palatal  residue;Delayed oral transit Oral - Nectar Oral - Nectar Cup: Left anterior bolus loss;Weak lingual  manipulation;Left pocketing in lateral sulci;Lingual/palatal  residue;Delayed oral transit Oral - Thin Oral - Thin Cup: Left anterior bolus loss;Weak lingual  manipulation;Left pocketing in lateral sulci;Lingual/palatal  residue;Delayed oral transit Oral - Solids Oral - Puree: Left anterior bolus loss;Weak lingual  manipulation;Left pocketing in lateral sulci;Lingual/palatal  residue;Delayed oral transit Oral - Regular: Left anterior bolus loss;Weak lingual  manipulation;Left pocketing in lateral sulci;Lingual/palatal  residue;Delayed oral transit Oral - Pill: Left anterior bolus loss;Weak lingual  manipulation;Left pocketing in lateral sulci;Lingual/palatal  residue;Delayed oral transit   Pharyngeal Phase Pharyngeal Phase Pharyngeal Phase: Impaired Pharyngeal - Honey Pharyngeal - Honey Cup: Delayed swallow initiation;Premature  spillage to valleculae Pharyngeal - Nectar Pharyngeal - Nectar Cup: Delayed swallow initiation;Premature  spillage to pyriform sinuses;Penetration/Aspiration after  swallow;Penetration/Aspiration before swallow;Moderate aspiration Penetration/Aspiration details (nectar cup): Material enters  airway, CONTACTS cords and not ejected out;Material enters  airway, passes BELOW cords without attempt by patient to eject  out (silent aspiration);Material does not enter airway Pharyngeal - Thin Pharyngeal -  Thin Cup: Delayed swallow initiation;Premature  spillage to pyriform sinuses;Penetration/Aspiration before  swallow;Penetration/Aspiration after swallow;Moderate aspiration Penetration/Aspiration details (thin cup): Material enters  airway, passes BELOW cords and not ejected out despite cough  attempt by patient Pharyngeal - Solids Pharyngeal - Puree: Delayed swallow initiation Pharyngeal - Regular: Delayed swallow initiation Pharyngeal - Pill: Delayed swallow initiation  Cervical Esophageal Phase    GO    Cervical Esophageal Phase Cervical Esophageal Phase: [facility]  Harlon Ditty, [place] CCC-SLP [phone]  Claudine Mouton 03/31/2013, 11:17 AM    Mr Jerry Graham/brain Wo Cm  03/30/2013   CLINICAL DATA:  Left facial droop.  Difficulty swallowing. Stroke.  EXAM: MRI Graham WITHOUT CONTRAST  MRA Graham WITHOUT CONTRAST  TECHNIQUE: Multiplanar, multiecho pulse sequences of the brain and surrounding structures were obtained without intravenous contrast. Angiographic images of the Graham were obtained using MRA technique without contrast.  COMPARISON:  CT Graham without contrast from the same day.  FINDINGS: MRI Graham FINDINGS  An acute nonhemorrhagic infarct is present within the right putamen extending superiorly into the corona radiata. There is a focal area of restricted diffusion in the left corona radiata as well. Atrophy and extensive white matter disease is advanced for age. Remote lacunar infarcts are present in the basal ganglia bilaterally, more prominent on the right. There are remote lacunar infarcts adjacent to the atrium of the left lateral ventricle. The remote lacunar infarct is present just to the left of midline within the mid brain.  Flow is present in the major intracranial arteries. The globes and orbits are intact. The study is mildly degraded by patient motion. Chronic mucosal thickening is noted in the right sphenoid sinus. There is a polyp or mucous retention cyst at the floor of the left maxillary  sinus. The paranasal sinuses and mastoid air cells are otherwise clear.  MRA Graham FINDINGS  Moderate stenosis is present in the left cavernous carotid artery. The right internal carotid artery is within normal limits. There is overall decrease caliber of the left A1 and M1 segments compared to the right. The anterior communicating artery is patent. Moderate stenosis is present within the inferior left M2 branch. There is prominent attenuation MCA branch vessels bilaterally, left greater than right. Asymmetric attenuation of distal ACA branch vessels is present as well.  The vertebral arteries are codominant. The basilar artery is within normal limits. Mild narrowing of the proximal basilar artery measures less than 50% relative to the distal vessel. Both posterior cerebral arteries originate from the basilar tip. Segmental irregularity is present within the proximal posterior cerebral arteries are bilaterally. A high-grade stenosis is present in the left P2 segment with distal attenuation bilaterally.  IMPRESSION: MRI Graham IMPRESSION  1. Acute nonhemorrhagic infarct of the right putamen and extending superiorly to the corona radiata. 2. Much smaller focal area of acute nonhemorrhagic infarction in the left corona radiata. 3. Age advanced atrophy and diffuse white matter disease likely reflects the sequela of chronic microvascular ischemia. 4. Remote lacunar infarcts of the basal ganglia bilaterally, right greater than left. 5. Remote lacunar infarct of the left paramedian midbrain.  MRA Graham IMPRESSION  1. 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 2. Proximal inferior M2 branch vessel stenoses bilaterally, left greater than right. 3. Moderate diffuse small vessel disease. 4. Mild narrowing of the proximal basilar artery. 5. High-grade stenosis of the distal left P2 segment. These results were called by telephone  at the time of interpretation on 03/30/2013 at 8:17 PM to [facility], RN 4N, who verbally acknowledged these results.   Electronically Signed   By: Gennette Pac   On: 03/30/2013 20:18      Medications:     Current Medications: . aspirin  300 mg Rectal Daily   Or  . aspirin  325 mg Oral Daily  . atorvastatin  80 mg Oral q1800  .  enoxaparin (LOVENOX) injection  40 mg Subcutaneous Q24H  . glipiZIDE  5 mg Oral QAC breakfast  . insulin aspart  0-9 Units Subcutaneous TID WC  . metFORMIN  850 mg Oral Q breakfast  . nicotine  14 mg Transdermal Daily     Infusions:      Assessment:  1. Acute Ischemic Stroke 2. Probable ischemic cardiomyopathy with EF 30-35% 3. DM 2 uncontrolled- Hemoglobin A1C 9.5  4. Smoker 5. Hyperlipidemia- LDL 197 Triglycerides 202 6. HTN    Plan/Discussion:    Mr Reiber is a 22 with a new acute ischemic stroke and found to have LV dysfunction on echo with EF 30-35%.   Given risk factors and regional wall motion abnormalities suspect this is ischemic cardiomyopathy. Will need cardiac cath when he is 4-6 weeks out from his acute CVA. Would treat with ASA, ACE-I, b-blockers and statin. With recent CVA would keep SBP > 120 to preserve cerebral blood flow. Currently does not have overt HF. Can add diuretic as needed.    Length of Stay: 2  CLEGG,AMY 04/01/2013, 8:59 AM  Advanced Heart Failure Team Pager [phone] (M-F; 7a - 4p)  Please contact [facility] for night-coverage after hours (4p -7a ) and weekends on [email or web address]  Patient seen and examined with Tonye Becket, NP. We discussed all aspects of the encounter. I agree with the assessment and plan as stated above. I have edited the note to reflect my findings. Suspect this is an ischemic cardiomyopathy. Will need outpatient cath in 4-6 weeks or sooner if he develops ischemic symptoms. Can be managed by general cardiology service (does not need advanced HF services at this point). Cardiology will follow.    Consider Neurology consult if not already done.   Daniel Bensimhon,MD 2:19 PM

## 2013-04-01 NOTE — Progress Notes (Signed)
Physical Therapy Treatment Patient Details Name: Jerry Graham MRN: [id number] DOB: 11/24/1950 Today's Date: 04/01/2013 Time: 0865-7846 PT Time Calculation (min): 30 min  PT Assessment / Plan / Recommendation  History of Present Illness  62 year old male admitted 03/30/13 with left lower facial weakness. PMH significant for CVA, HTN, DM, + tobacco, difficulty with pills this morning, failed RNSSS. CT results indicate no hemorrhage, mild atrophy, small lacunar infarct in the right thalamus, decreased attentuation in the right basal ganglia. MRI (+) Acute nonhemorrhagic infarct of the right putamen and extending superiorly to the corona radiata. 2. Much smaller focal area of acute nonhemorrhagic infarction in the left corona radiata.3. Remote lacunar infarcts of the basal ganglia bilaterally, right greater than left. 4. Remote lacunar infarct of the left paramedian midbrain.    PT Comments   Pt limited in participation today due to fatigue and reports feeling dizziness during standing LE strengthening exercises, BP assessed after exercises: 156/99 and O2 sat: 97%. NP informed. Pt continues to demonstrate decreased safety awareness and impulsive behavior. Pt would benefit from skilled PT to improve functional mobility and safety.  Follow Up Recommendations  SNF;Supervision/Assistance - 24 hour     Does the patient have the potential to tolerate intense rehabilitation     Barriers to Discharge        Equipment Recommendations  Other (comment) (TBD, possible cane for safety.)    Recommendations for Other Services    Frequency Min 4X/week   Progress towards PT Goals Progress towards PT goals: Progressing toward goals  Plan Discharge plan needs to be updated    Precautions / Restrictions Precautions Precautions: Fall Restrictions Weight Bearing Restrictions: No   Pertinent Vitals/Pain No c/o pain. Pt reports dizziness during standing LE strengthening exercises, BP assessed after exercises:  156/99 and O2 sat: 97%. Pt reports dizziness decreased upon lying supine. NP notified at end of session.    Mobility  Bed Mobility Bed Mobility: Supine to Sit;Sitting - Scoot to Edge of Bed;Sit to Supine Supine to Sit: 5: Supervision;With rails Sitting - Scoot to Edge of Bed: 5: Supervision Sit to Supine: 5: Supervision Details for Bed Mobility Assistance: Supervision to ensure safety. Transfers Transfers: Sit to Stand;Stand to Sit Sit to Stand: 5: Supervision;With upper extremity assist;From bed;From chair/3-in-1 Stand to Sit: With upper extremity assist;To chair/3-in-1;5: Supervision;To bed Details for Transfer Assistance: Supervision to ensure safety as pt has decreased safety awareness. Ambulation/Gait Ambulation/Gait Assistance: 5: Supervision Ambulation Distance (Feet): 500 Feet Assistive device: None Ambulation/Gait Assistance Details: Supervison to ensure safety, pt with intermittent bouts of scissoring gait but no LOB as pt ablet to self correct. Pt able to perform vertical/horizontal head turns without LOB but did require redirection to task as tried to enter other pt rooms. Gait Pattern: Step-through pattern;Decreased dorsiflexion - left;Wide base of support;Decreased stride length Gait velocity: Decreased during last' 100' General Gait Details: Pt able to state "my L foot keeps dragging", PT provided cues to step with L heel in order to improve heel strike. Stairs: No Modified Rankin (Stroke Patients Only) Pre-Morbid Rankin Score: No symptoms Modified Rankin: Moderately severe disability    Exercises General Exercises - Lower Extremity Ankle Circles/Pumps: AROM;Both;20 reps;Supine Hip ABduction/ADduction: AROM;Left;10 reps;Standing Straight Leg Raises: AROM;10 reps;Left;Standing Hip Flexion/Marching: AROM;20 reps;Both;Standing Other Exercises Other Exercises: L LE hamstring curls and hip ext. performed at counter with 1UE for support and min guard for safety, x10  reps/each   PT Diagnosis:    PT Problem List:   PT Treatment Interventions:  PT Goals (current goals can now be found in the care plan section) Acute Rehab PT Goals Patient Stated Goal: none specified PT Goal Formulation: With patient Time For Goal Achievement: 04/14/13 Potential to Achieve Goals: Good  Visit Information  Last PT Received On: 04/01/13 Assistance Needed: +1 History of Present Illness:  62 year old male admitted 03/30/13 with left lower facial weakness. PMH significant for CVA, HTN, DM, + tobacco, difficulty with pills this morning, failed RNSSS. CT results indicate no hemorrhage, mild atrophy, small lacunar infarct in the right thalamus, decreased attentuation in the right basal ganglia. MRI (+) Acute nonhemorrhagic infarct of the right putamen and extending superiorly to the corona radiata. 2. Much smaller focal area of acute nonhemorrhagic infarction in the left corona radiata.3. Remote lacunar infarcts of the basal ganglia bilaterally, right greater than left. 4. Remote lacunar infarct of the left paramedian midbrain.     Subjective Data  Patient Stated Goal: none specified   Cognition  Cognition Behavior During Therapy: WFL for tasks assessed/performed Overall Cognitive Status: Impaired/Different from baseline Area of Impairment: Following commands;Safety/judgement;Problem solving Following Commands: Follows multi-step commands with increased time;Follows multi-step commands inconsistently Safety/Judgement: Decreased awareness of safety Problem Solving: Slow processing;Difficulty sequencing    Balance     End of Session PT - End of Session Equipment Utilized During Treatment: Gait belt Activity Tolerance: Patient limited by fatigue Patient left: in bed;with bed alarm set;with call bell/phone within reach   GP     [name] 04/01/2013, 2:03 PM

## 2013-04-01 NOTE — Progress Notes (Signed)
Speech Language Pathology Dysphagia Treatment Patient Details Name: Torin Modica MRN: [id number] DOB: 1950-08-24 Today's Date: 04/01/2013 Time: 7829-5621 SLP Time Calculation (min): 38 min  Assessment / Plan / Recommendation Clinical Impression  Pt seen for reinforcement of swallowing strategies. Pt able to verbalize strategy with min question cues, but required moderate verbal and tactile cues to fully tuck chin. Pt struggled to intiate second swallow, SLP instructed pt to collect oral redue by moving tongue and mouth to help trigger swallow. Pts accuracy improved by end of session. Will upgrade to nectar thick liquids. Suspect pt may at least be better than 50% accurate with strategies to allow for nectar thick liquids. Discussed emergent awareness of deficits, listening for wet vocal quality and thinking about fall risk. Pt improving, but still impulsive. WIll need at least 24 hour supervision. SNF recommended     Diet Recommendation  Continue with Current Diet: Dysphagia 3 (mechanical soft);Nectar-thick liquid    SLP Plan Continue with current plan of care   Pertinent Vitals/Pain NA   Swallowing Goals  SLP Swallowing Goals Patient will consume recommended diet without observed clinical signs of aspiration with: Moderate assistance Swallow Study Goal #1 - Progress: Progressing toward goal Patient will utilize recommended strategies during swallow to increase swallowing safety with: Minimal cueing Swallow Study Goal #2 - Progress: Progressing toward goal  General Temperature Spikes Noted: No Respiratory Status: Room air Behavior/Cognition: Alert;Cooperative;Pleasant mood Oral Cavity - Dentition: Missing dentition Patient Positioning: Upright in bed  Oral Cavity - Oral Hygiene Does patient have any of the following "at risk" factors?: Other - dysphagia;Diet - patient on thickened liquids Patient is AT RISK - Oral Care Protocol followed (see row info): Yes   Dysphagia  Treatment Treatment focused on: Skilled observation of diet tolerance;Upgraded PO texture trials;Patient/family/caregiver education;Utilization of compensatory strategies;Facilitation of oral phase Treatment Methods/Modalities: Skilled observation;Differential diagnosis Patient observed directly with PO's: Yes Type of PO's observed: Nectar-thick liquids;Honey-thick liquids Feeding: Able to feed self Liquids provided via: Cup Oral Phase Signs & Symptoms: Right pocketing;Left pocketing Pharyngeal Phase Signs & Symptoms: Wet vocal quality;Delayed throat clear Type of cueing: Verbal;Visual Amount of cueing: Moderate   GO    Harlon Ditty, [place] CCC-SLP [phone]  Claudine Mouton 04/01/2013, 3:51 PM

## 2013-04-01 NOTE — Plan of Care (Signed)
Problem: Food- and Nutrition-Related Knowledge Deficit (NB-1.1) Goal: Nutrition education Formal process to instruct or train a patient/client in a skill or to impart knowledge to help patients/clients voluntarily manage or modify food choices and eating behavior to maintain or improve health. Outcome: Completed/Met Date Met:  04/01/13  RD consulted for nutrition education regarding CHF.  RD provided "Low Sodium Nutrition Therapy" handout from the [employer]. Reviewed patient's dietary recall. Provided examples on ways to decrease sodium intake in diet. Discouraged intake of processed foods and use of salt shaker. Encouraged fresh fruits and vegetables as well as whole grain sources of carbohydrates to maximize fiber intake. Teach back method used.  Expect fair compliance.  Body mass index is 25.19 kg/(m^2). Pt meets criteria for Overweight based on current BMI.  Current diet order is Dysphagia 3, honey thick liquid diet; reports he's eating well.  Labs and medications reviewed. No further nutrition interventions warranted at this time. If additional nutrition issues arise, please re-consult RD.   Maureen Chatters, RD, LDN Pager #: [phone] After-Hours Pager #: [phone]

## 2013-04-01 NOTE — Progress Notes (Signed)
Subjective: Patient seen at bedside. He feels well. He still has a facial droop. Denies new weakness or numbness. We talked about his new diagnosis of heart failure. He is surprised that he has it and disappointed that he will have to take more medicines. He wants me to call it "CHF" and not heart failure.   Objective: Vital signs in last 24 hours: Filed Vitals:   03/31/13 1850 03/31/13 2153 04/01/13 0108 04/01/13 0527  BP: 158/95 168/90 164/99 175/99  Pulse: 75 72 80 74  Temp: 98.3 F (36.8 C) 97.8 F (36.6 C) 98.1 F (36.7 C) 97.4 F (36.3 C)  TempSrc: Oral Oral Oral Oral  Resp: 20 20 20 20   Height:      Weight:      SpO2: 96% 98% 98% 96%   Weight change:  No intake or output data in the 24 hours ending 04/01/13 0802  Physical Exam  Constitutional: He is oriented to person, place, and time and well-developed, well-nourished, and in no distress.  HENT:  Head: Normocephalic and atraumatic.  Mouth/Throat: Oropharynx is clear and moist.  Eyes: Conjunctivae and EOM are normal. Pupils are equal, round, and reactive to light.  Neck: Normal range of motion. Neck supple.  Cardiovascular: Normal rate, regular rhythm and normal heart sounds. Exam reveals no gallop and no friction rub.  No murmur heard.  Pulmonary/Chest: Effort normal. No respiratory distress. He has no wheezes. He has soft crackles at lung bases bilaterally. He exhibits no tenderness.  Abdominal: Soft. There is no tenderness.  Musculoskeletal: Normal range of motion. He exhibits no edema and no tenderness.  Neurological: He is alert and oriented to person, place, and time. He has normal reflexes. He displays facial asymmetry and abnormal speech (Dysarthria). He displays no weakness, no atrophy, no tremor, normal stance and normal reflexes. A cranial nerve deficit (Left lower facial droop with forehead sparing. Tongue deviation to left. Decreased hearing on left. ) is present. No sensory deficit. He exhibits normal  muscle tone. He has an abnormal Cerebellar Exam (Impaired rapid alternating finger movements in left hand). He has a normal Finger-Nose-Finger Test and a normal Romberg Test. Gait normal. Coordination abnormal. Gait normal. GCS score is 15.  Reflex Scores:  Brachioradialis reflexes are 2+ on the right side and 2+ on the left side.  Patellar reflexes are 2+ on the right side and 2+ on the left side.  Skin: Skin is warm and dry. He is not diaphoretic.  Psychiatric: Affect normal.   Lab Results: Basic Metabolic Panel:  Recent Labs Lab 03/30/13 1141  NA 138  K 4.2  CL 100  CO2 27  GLUCOSE 231*  BUN 10  CREATININE 0.75  CALCIUM 9.7   CBC:  Recent Labs Lab 03/30/13 1141  WBC 6.3  NEUTROABS 2.5  HGB 16.7  HCT 48.4  MCV 81.9  PLT 219   CBG:  Recent Labs Lab 03/30/13 2128 03/31/13 0716 03/31/13 1135 03/31/13 1641 03/31/13 2152 04/01/13 0736  GLUCAP 168* 197* 260* 132* 140* 181*   Fasting Lipid Panel:  Recent Labs Lab 03/31/13 0620  CHOL 275*  HDL 38*  LDLCALC 197*  TRIG 202*  CHOLHDL 7.2   Alcohol Level:  Recent Labs Lab 03/30/13 1141  ETH <11    Micro Results: No results found for this or any previous visit (from the past 240 hour(s)). Studies/Results: Dg Chest 2 View  03/31/2013   CLINICAL DATA:  Possible lung mass.  EXAM: CHEST  2 VIEW  COMPARISON:  PA and lateral chest 03/30/2013.  FINDINGS: No pulmonary nodule or mass is identified. The lungs appear clear. Heart size is normal. No pneumothorax or pleural fluid. Remote distal right clavicle fracture is identified.  IMPRESSION: Negative for evidence of neoplasm. No acute finding.   Electronically Signed   By: Drusilla Kanner M.D.   On: 03/31/2013 22:44   Dg Chest 2 View  03/30/2013   CLINICAL DATA:  Stroke  EXAM: CHEST  2 VIEW  COMPARISON:  None.  FINDINGS: Cardiomediastinal silhouette is unremarkable. Elevation of the right hemidiaphragm is noted. No acute infiltrate or pulmonary edema. Bony thorax  is unremarkable.  IMPRESSION: No active cardiopulmonary disease. Elevation of the right hemidiaphragm.   Electronically Signed   By: Natasha Mead   On: 03/30/2013 20:12   Ct Head Wo Contrast  03/30/2013   CLINICAL DATA:  Headache, vertigo  EXAM: CT HEAD WITHOUT CONTRAST  TECHNIQUE: Contiguous axial images were obtained from the base of the skull through the vertex without intravenous contrast.  COMPARISON:  None.  FINDINGS: No skull fracture is noted. Paranasal sinuses and mastoid air cells are unremarkable.  No intracranial hemorrhage, mass effect or midline shift. Mild cerebral atrophy. Mild periventricular and patchy subcortical white matter decreased attenuation probable due to chronic small vessel ischemic changes. There is small lacunar infarct measures about 5 mm in right thalamus. There is small area of decreased attenuation in right basal ganglia. Measures about 1.2 cm. This may be due to ischemia of indeterminate age. Clinical correlation is necessary. If recent ischemia suspected further correlation with brain MRI with diffusion imaging is recommended. No definite acute cortical infarction. No mass lesion is noted on this unenhanced scan.  IMPRESSION: No intracranial hemorrhage, mass effect or midline shift. Mild cerebral atrophy.There is small lacunar infarct measures about 5 mm in right thalamus. There is small area of decreased attenuation in right basal ganglia. Measures about 1.2 cm. This may be due to ischemia of indeterminate age. Clinical correlation is necessary. If recent ischemia suspected further correlation with brain MRI with diffusion imaging is recommended. No definite acute cortical infarction.   Electronically Signed   By: Natasha Mead   On: 03/30/2013 13:07   Mr Brain Wo Contrast  03/30/2013   CLINICAL DATA:  Left facial droop.  Difficulty swallowing. Stroke.  EXAM: MRI HEAD WITHOUT CONTRAST  MRA HEAD WITHOUT CONTRAST  TECHNIQUE: Multiplanar, multiecho pulse sequences of the brain and  surrounding structures were obtained without intravenous contrast. Angiographic images of the head were obtained using MRA technique without contrast.  COMPARISON:  CT head without contrast from the same day.  FINDINGS: MRI HEAD FINDINGS  An acute nonhemorrhagic infarct is present within the right putamen extending superiorly into the corona radiata. There is a focal area of restricted diffusion in the left corona radiata as well. Atrophy and extensive white matter disease is advanced for age. Remote lacunar infarcts are present in the basal ganglia bilaterally, more prominent on the right. There are remote lacunar infarcts adjacent to the atrium of the left lateral ventricle. The remote lacunar infarct is present just to the left of midline within the mid brain.  Flow is present in the major intracranial arteries. The globes and orbits are intact. The study is mildly degraded by patient motion. Chronic mucosal thickening is noted in the right sphenoid sinus. There is a polyp or mucous retention cyst at the floor of the left maxillary sinus. The paranasal sinuses and mastoid air cells are otherwise clear.  MRA HEAD FINDINGS  Moderate stenosis is present in the left cavernous carotid artery. The right internal carotid artery is within normal limits. There is overall decrease caliber of the left A1 and M1 segments compared to the right. The anterior communicating artery is patent. Moderate stenosis is present within the inferior left M2 branch. There is prominent attenuation MCA branch vessels bilaterally, left greater than right. Asymmetric attenuation of distal ACA branch vessels is present as well.  The vertebral arteries are codominant. The basilar artery is within normal limits. Mild narrowing of the proximal basilar artery measures less than 50% relative to the distal vessel. Both posterior cerebral arteries originate from the basilar tip. Segmental irregularity is present within the proximal posterior cerebral  arteries are bilaterally. A high-grade stenosis is present in the left P2 segment with distal attenuation bilaterally.  IMPRESSION: MRI HEAD IMPRESSION  1. Acute nonhemorrhagic infarct of the right putamen and extending superiorly to the corona radiata. 2. Much smaller focal area of acute nonhemorrhagic infarction in the left corona radiata. 3. Age advanced atrophy and diffuse white matter disease likely reflects the sequela of chronic microvascular ischemia. 4. Remote lacunar infarcts of the basal ganglia bilaterally, right greater than left. 5. Remote lacunar infarct of the left paramedian midbrain.  MRA HEAD IMPRESSION  1. 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 2. Proximal inferior M2 branch vessel stenoses bilaterally, left greater than right. 3. Moderate diffuse small vessel disease. 4. Mild narrowing of the proximal basilar artery. 5. High-grade stenosis of the distal left P2 segment. These results were called by telephone at the time of interpretation on 03/30/2013 at 8:17 PM to [facility], RN 4N, who verbally acknowledged these results.   Electronically Signed   By: Gennette Pac   On: 03/30/2013 20:18   Dg Swallowing Func-speech Pathology  03/31/2013   Riley Nearing [place], CCC-SLP     03/31/2013 11:18 AM Objective Swallowing Evaluation: Modified Barium Swallowing Study   Patient Details  Name: Jerry Graham MRN: [id number] Date of Birth: 04/22/1951  Today's Date: 03/31/2013 Time: 1030-1047 SLP Time Calculation (min): 17 min  Past Medical History:  Past Medical History  Diagnosis Date  . Hypertension   . Diabetes mellitus without complication   . Stroke    Past Surgical History: History reviewed. No pertinent past  surgical history. HPI:  62 year old male admitted 03/30/13 with left lower facial  weakness.  PMH significant for CVA, HTN, DM, + tobacco,  difficulty with pills this morning, failed RNSSS.   CT results  indicate no hemorrhage, mild atrophy, small lacunar infarct in  the right thalamus, decreased attentuation in the right basal  ganglia.     Assessment / Plan / Recommendation Clinical Impression  Dysphagia Diagnosis: Moderate oral phase dysphagia;Mild  pharyngeal phase dysphagia Clinical impression: Pt demonstrates a primary oral phase  dysphagia with left lingual and labial weakness with premature  spillage of boluses and moderate oral residuals after the  swallow. There are also pharyngeal sensory deficits resulting in  penetration/aspration before the swallow with thin liquids with  late sensation and ineficient expectoration. Pt would be able to  tolerate nectar thick liquids except that oral residuals fall to  airway post swallow. Pts cognitive impairments impede timely  adherance to cues and strategies. If pt could tuck chin and  quickly swallow twice without impulsive talking, nectar thick  liquids may be attempted. For now,  will recommend dys 3  (mechanical soft) with honey thick liquids until pt demonstrates  improved adherance to strategies. A chin tuck is not particularly  needed with honey thick liquids, but it improves airway closure  for nectar thick liquids.     Treatment Recommendation  Therapy as outlined in treatment plan below    Diet Recommendation Dysphagia 3 (Mechanical Soft);Honey-thick  liquid   Liquid Administration via: Cup Medication Administration: Whole meds with puree Supervision: Full supervision/cueing for compensatory strategies Compensations: Small sips/bites;Check for pocketing;Multiple dry  swallows after each bite/sip;Clear throat intermittently Postural Changes and/or Swallow Maneuvers: Seated upright 90  degrees;Upright 30-60 min after meal;Chin tuck    Other  Recommendations Oral Care Recommendations: Oral care BID Other Recommendations: Remove water pitcher   Follow Up Recommendations  Inpatient Rehab    Frequency and Duration min 2x/week  2 weeks   Pertinent  Vitals/Pain NA    SLP Swallow Goals Patient will utilize recommended strategies during swallow to  increase swallowing safety with: Minimal cueing Swallow Study Goal #2 - Progress: Progressing toward goal   General HPI: 62 year old male admitted 03/30/13 with left lower  facial weakness.  PMH significant for CVA, HTN, DM, + tobacco,  difficulty with pills this morning, failed RNSSS.  CT results  indicate no hemorrhage, mild atrophy, small lacunar infarct in  the right thalamus, decreased attentuation in the right basal  ganglia. Type of Study: Modified Barium Swallowing Study Reason for Referral: Objectively evaluate swallowing function Diet Prior to this Study: Dysphagia 2 (chopped);Nectar-thick  liquids Temperature Spikes Noted: No Respiratory Status: Room air History of Recent Intubation: No Behavior/Cognition: Alert;Cooperative;Pleasant mood Oral Cavity - Dentition: Missing dentition Oral Motor / Sensory Function: Impaired - see Bedside swallow  eval Patient Positioning: Upright in chair Baseline Vocal Quality: Wet Volitional Cough: Strong Volitional Swallow: Able to elicit (with effort anc ues) Anatomy: Within functional limits Pharyngeal Secretions: Not observed secondary MBS    Reason for Referral Objectively evaluate swallowing function   Oral Phase Oral Preparation/Oral Phase Oral Phase: Impaired Oral - Honey Oral - Honey Cup: Left anterior bolus loss;Weak lingual  manipulation;Left pocketing in lateral sulci;Lingual/palatal  residue;Delayed oral transit Oral - Nectar Oral - Nectar Cup: Left anterior bolus loss;Weak lingual  manipulation;Left pocketing in lateral sulci;Lingual/palatal  residue;Delayed oral transit Oral - Thin Oral - Thin Cup: Left anterior bolus loss;Weak lingual  manipulation;Left pocketing in lateral sulci;Lingual/palatal  residue;Delayed oral transit Oral - Solids Oral - Puree: Left anterior bolus loss;Weak lingual  manipulation;Left pocketing in lateral sulci;Lingual/palatal   residue;Delayed oral transit Oral - Regular: Left anterior bolus loss;Weak lingual  manipulation;Left pocketing in lateral sulci;Lingual/palatal  residue;Delayed oral transit Oral - Pill: Left anterior bolus loss;Weak lingual  manipulation;Left pocketing in lateral sulci;Lingual/palatal  residue;Delayed oral transit   Pharyngeal Phase Pharyngeal Phase Pharyngeal Phase: Impaired Pharyngeal - Honey Pharyngeal - Honey Cup: Delayed swallow initiation;Premature  spillage to valleculae Pharyngeal - Nectar Pharyngeal - Nectar Cup: Delayed swallow initiation;Premature  spillage to pyriform sinuses;Penetration/Aspiration after  swallow;Penetration/Aspiration before swallow;Moderate aspiration Penetration/Aspiration details (nectar cup): Material enters  airway, CONTACTS cords and not ejected out;Material enters  airway, passes BELOW cords without attempt by patient to eject  out (silent aspiration);Material does not enter airway Pharyngeal - Thin Pharyngeal - Thin Cup: Delayed swallow initiation;Premature  spillage to pyriform sinuses;Penetration/Aspiration before  swallow;Penetration/Aspiration after swallow;Moderate aspiration Penetration/Aspiration details (thin cup): Material enters  airway, passes BELOW cords and not ejected out despite cough  attempt by patient Pharyngeal - Solids Pharyngeal -  Puree: Delayed swallow initiation Pharyngeal - Regular: Delayed swallow initiation Pharyngeal - Pill: Delayed swallow initiation  Cervical Esophageal Phase    GO    Cervical Esophageal Phase Cervical Esophageal Phase: [facility]        Harlon Ditty, MA CCC-SLP [phone]  Dyanne Iha Riley Nearing 03/31/2013, 11:17 AM    Mr Maxine Glenn Head/brain Wo Cm  03/30/2013   CLINICAL DATA:  Left facial droop.  Difficulty swallowing. Stroke.  EXAM: MRI HEAD WITHOUT CONTRAST  MRA HEAD WITHOUT CONTRAST  TECHNIQUE: Multiplanar, multiecho pulse sequences of the brain and surrounding structures were obtained without intravenous contrast. Angiographic images  of the head were obtained using MRA technique without contrast.  COMPARISON:  CT head without contrast from the same day.  FINDINGS: MRI HEAD FINDINGS  An acute nonhemorrhagic infarct is present within the right putamen extending superiorly into the corona radiata. There is a focal area of restricted diffusion in the left corona radiata as well. Atrophy and extensive white matter disease is advanced for age. Remote lacunar infarcts are present in the basal ganglia bilaterally, more prominent on the right. There are remote lacunar infarcts adjacent to the atrium of the left lateral ventricle. The remote lacunar infarct is present just to the left of midline within the mid brain.  Flow is present in the major intracranial arteries. The globes and orbits are intact. The study is mildly degraded by patient motion. Chronic mucosal thickening is noted in the right sphenoid sinus. There is a polyp or mucous retention cyst at the floor of the left maxillary sinus. The paranasal sinuses and mastoid air cells are otherwise clear.  MRA HEAD FINDINGS  Moderate stenosis is present in the left cavernous carotid artery. The right internal carotid artery is within normal limits. There is overall decrease caliber of the left A1 and M1 segments compared to the right. The anterior communicating artery is patent. Moderate stenosis is present within the inferior left M2 branch. There is prominent attenuation MCA branch vessels bilaterally, left greater than right. Asymmetric attenuation of distal ACA branch vessels is present as well.  The vertebral arteries are codominant. The basilar artery is within normal limits. Mild narrowing of the proximal basilar artery measures less than 50% relative to the distal vessel. Both posterior cerebral arteries originate from the basilar tip. Segmental irregularity is present within the proximal posterior cerebral arteries are bilaterally. A high-grade stenosis is present in the left P2 segment with  distal attenuation bilaterally.  IMPRESSION: MRI HEAD IMPRESSION  1. Acute nonhemorrhagic infarct of the right putamen and extending superiorly to the corona radiata. 2. Much smaller focal area of acute nonhemorrhagic infarction in the left corona radiata. 3. Age advanced atrophy and diffuse white matter disease likely reflects the sequela of chronic microvascular ischemia. 4. Remote lacunar infarcts of the basal ganglia bilaterally, right greater than left. 5. Remote lacunar infarct of the left paramedian midbrain.  MRA HEAD IMPRESSION  1. 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 2. Proximal inferior M2 branch vessel stenoses bilaterally, left greater than right. 3. Moderate diffuse small vessel disease. 4. Mild narrowing of the proximal basilar artery. 5. High-grade stenosis of the distal left P2 segment. These results were called by telephone at the time of interpretation on 03/30/2013 at 8:17 PM to [facility], RN 4N, who verbally acknowledged these results.   Electronically Signed   By: Gennette Pac   On: 03/30/2013  20:18   Medications: I have reviewed the patient's current medications. Scheduled Meds: . aspirin  300 mg Rectal Daily   Or  . aspirin  325 mg Oral Daily  . atorvastatin  80 mg Oral q1800  . enoxaparin (LOVENOX) injection  40 mg Subcutaneous Q24H  . glipiZIDE  5 mg Oral QAC breakfast  . insulin aspart  0-9 Units Subcutaneous TID WC  . metFORMIN  850 mg Oral Q breakfast  . nicotine  14 mg Transdermal Daily   Continuous Infusions:  PRN Meds:.food thickener  Assessment/Plan: Mr. Kyi Romanello is a 62 y.o. male PMH DM2, HTN, tobacco abuse who presents with a left facial droop.   #Acute ischemic stroke - Patient presented with left facial droop with forehead sparing, dysarthria, hand clumsiness, found to have an acute ischemic right putamen infarct on MRI. Carotid doppler was wnl. 2D  echo showed no clot, but was remarkable for LVEF 30-35% (see problem below). PT, OT, and SLP all evaluated him and the consensus was CIR to improve safety and functional status. However per the rehab admission coordinator he does not meet criteria, they are recommending HH or SNF as needed. Patient is uninsured. Social work is on the case. - ASA 325 daily - Dysphagia 3 diet with honey thick liquids, consult for diet education - Neuro checks q4h, NIH stroke scale per nursing shift  - Follow up social work Scientist, forensic - He will need neurology follow up as an outpatient  #Newly diagnosed sCHF - 2D echo showed moderately to severely reduced systolic function, EF 30% to 35%. Akinesis of the basal-midanteroseptal myocardium. Akinesis of the inferior myocardium. Doppler parameters consistent with abnormal left ventricular relaxation (grade 1 diastolic dysfunction). He has no signs of volume overload on exam other than some fine crackles at the lung bases. CXR was wnl, no pulmonary edema. He denies SOB, orthopnea, PND, leg swelling. - Consult placed to heart failure team - Added Coreg 3.125 BID since we are now outside the 48 hour permissive hypertension window - Patient will need cardiology follow up as an outpatient - Checking HIV since very little heath maintenance prior to now  #HLD - Lipid panel showed LDL 197. He was discharged from [facility] on Zocor last year but did not comply. - Continue atorvastatin 80mg  daily   #Type II diabetes mellitus - A1C 9.5. CBGs 140-190s. - Patient has been refusing SSI - Continue home metformin and glipizide  - CBG monitoring AC and QHS - Will need close primary care follow up for diabetes management and education, likely with our [facility]  #Hypertension - SBP 150-160s.  - Starting back home lisinopril since we are now outside the 48 hour permissive hypertension window  #Tobacco abuse - Patient is an everyday smoker. We discussed the importance of quitting to prevent another  stroke. - Nicotine patch - Smoking cessation counseling   #?Lung mass -  There was a questionable lung mass seen on his CXR from his admission to [facility] 10/04/11-10/06/12, per the discharge summary. We repeated a chest x-ray yesterday to specifically look, and it showed no evidence of pulmonary nodule or mass.  #DVT PPX - Subq lovenox, SCDs   Dispo: Disposition is deferred at this time, awaiting improvement of current medical problems.  Anticipated discharge in approximately 1-3 day(s).   The patient does not have a current PCP (No primary provider on file.) and does need an [facility] hospital follow-up appointment after discharge.  The patient does not have transportation limitations that hinder transportation to  clinic appointments.  .Services Needed at time of discharge: Y = Yes, Blank = No PT:   OT:   RN:   Equipment:   Other:     LOS: 2 days   Vivi Barrack, MD 04/01/2013, 8:02 AM

## 2013-04-01 NOTE — Progress Notes (Signed)
Occupational Therapy Treatment Patient Details Name: Caitlin Hillmer MRN: [id number] DOB: [birth year] Today's Date: 04/01/2013 Time: 9562-1308 OT Time Calculation (min): 18 min  OT Assessment / Plan / Recommendation  History of present illness  62 year old male admitted 03/30/13 with left lower facial weakness. PMH significant for CVA, HTN, DM, + tobacco, difficulty with pills this morning, failed RNSSS. CT results indicate no hemorrhage, mild atrophy, small lacunar infarct in the right thalamus, decreased attentuation in the right basal ganglia. MRI (+) Acute nonhemorrhagic infarct of the right putamen and extending superiorly to the corona radiata. 2. Much smaller focal area of acute nonhemorrhagic infarction in the left corona radiata.3. Remote lacunar infarcts of the basal ganglia bilaterally, right greater than left. 4. Remote lacunar infarct of the left paramedian midbrain.    OT comments  Pt currently with poor recall of precautions and safety. Pt with balance deficits and needs continued therapy. Pt currently with no insurance provider limiting continued care. Pt lives alone so transportation to outpatient very limited. Pt may benefit from public transportation education for outpatient follow up.  Follow Up Recommendations  CIR    Barriers to Discharge       Equipment Recommendations  Other (comment)    Recommendations for Other Services Rehab consult  Frequency Min 2X/week   Progress towards OT Goals Progress towards OT goals: Progressing toward goals  Plan Discharge plan remains appropriate    Precautions / Restrictions Precautions Precautions: Fall   Pertinent Vitals/Pain fatigue    ADL  Tub/Shower Transfer: Minimal assistance Tub/Shower Transfer Method: Ambulating Tub/Shower Transfer Equipment:  (tub transfer) Equipment Used: Gait belt Transfers/Ambulation Related to ADLs: Pt ambulating with incr left LE weakness compared to prevoius session. pt reports feeling very  fatigued all over. Pt states :its like my body wants to go to sleep too"  ADL Comments: Pt no recall of previous session. Pt needed reeducation for stepping into tub with Right LE first. Pt completed task 3 times. The second attempt pt again attempting LT LE first and tripping on tub surface. Pt plans to complete tub transfer at home and advised to not complete tub transfers alone at this time. Pt navigated unit back to room without deficits.    OT Diagnosis:    OT Problem List:   OT Treatment Interventions:     OT Goals(current goals can now be found in the care plan section) Acute Rehab OT Goals Patient Stated Goal: none specified OT Goal Formulation: With patient Time For Goal Achievement: 04/14/13 Potential to Achieve Goals: Good ADL Goals Pt Will Perform Tub/Shower Transfer: Tub transfer;with supervision;ambulating Additional ADL Goal #1: Pt will complete 2 step command to complete adl task Additional ADL Goal #2: Pt will savenger hunt ( locate 3 objects on unit) with only being given clues to problem solve MOD I  Visit Information  Last OT Received On: 04/01/13 Assistance Needed: +1 History of Present Illness:  62 year old male admitted 03/30/13 with left lower facial weakness. PMH significant for CVA, HTN, DM, + tobacco, difficulty with pills this morning, failed RNSSS. CT results indicate no hemorrhage, mild atrophy, small lacunar infarct in the right thalamus, decreased attentuation in the right basal ganglia. MRI (+) Acute nonhemorrhagic infarct of the right putamen and extending superiorly to the corona radiata. 2. Much smaller focal area of acute nonhemorrhagic infarction in the left corona radiata.3. Remote lacunar infarcts of the basal ganglia bilaterally, right greater than left. 4. Remote lacunar infarct of the left paramedian midbrain.  Subjective Data      Prior Functioning       Cognition  Cognition Arousal/Alertness: Awake/alert Behavior During Therapy: WFL for  tasks assessed/performed Overall Cognitive Status: Impaired/Different from baseline Memory: Decreased short-term memory Safety/Judgement: Decreased awareness of deficits Awareness: Anticipatory    Mobility  Bed Mobility Bed Mobility: Supine to Sit;Sitting - Scoot to Edge of Bed Supine to Sit: 5: Supervision Sitting - Scoot to Edge of Bed: 5: Supervision Sit to Supine: 5: Supervision Transfers Transfers: Sit to Stand;Stand to Sit Sit to Stand: 5: Supervision;From bed Stand to Sit: 5: Supervision;To chair/3-in-1    Exercises      Balance     End of Session OT - End of Session Activity Tolerance: Patient tolerated treatment well Patient left: in bed;with call bell/phone within reach;with bed alarm set Nurse Communication: Mobility status;Precautions  GO     Harolyn Rutherford 04/01/2013, 3:32 PM  Pager: [phone]

## 2013-04-01 NOTE — Progress Notes (Signed)
I have reviewed this note and agree with all findings. Kati Gwin Eagon, PT, DPT Pager: [phone]   

## 2013-04-02 DIAGNOSIS — I6350 Cerebral infarction due to unspecified occlusion or stenosis of unspecified cerebral artery: Principal | ICD-10-CM

## 2013-04-02 LAB — GLUCOSE, CAPILLARY
Glucose-Capillary: 162 mg/dL — ABNORMAL HIGH (ref 70–99)
Glucose-Capillary: 100 mg/dL — ABNORMAL HIGH (ref 70–99)
Glucose-Capillary: 190 mg/dL — ABNORMAL HIGH (ref 70–99)

## 2013-04-02 NOTE — Progress Notes (Signed)
Occupational Therapy Treatment Patient Details Name: Darden Flemister MRN: [id number] DOB: 12-28-1950 Today's Date: 04/02/2013 Time: 8295-6213 OT Time Calculation (min): 31 min  OT Assessment / Plan / Recommendation  History of present illness  62 year old male admitted 03/30/13 with left lower facial weakness. PMH significant for CVA, HTN, DM, + tobacco, difficulty with pills this morning, failed RNSSS. CT results indicate no hemorrhage, mild atrophy, small lacunar infarct in the right thalamus, decreased attentuation in the right basal ganglia. MRI (+) Acute nonhemorrhagic infarct of the right putamen and extending superiorly to the corona radiata. 2. Much smaller focal area of acute nonhemorrhagic infarction in the left corona radiata.3. Remote lacunar infarcts of the basal ganglia bilaterally, right greater than left. 4. Remote lacunar infarct of the left paramedian midbrain.    OT comments  Pt making excellent progress. Impulsive at times with decreased attention to L with increased distraction. REc pt follow up with neuro outpt center if able. Will continue to follow.   Follow Up Recommendations  Outpatient OT    Barriers to Discharge       Equipment Recommendations       Recommendations for Other Services    Frequency Min 2X/week   Progress towards OT Goals Progress towards OT goals: Progressing toward goals  Plan Discharge plan needs to be updated    Precautions / Restrictions Precautions Precautions: Fall   Pertinent Vitals/Pain no apparent distress     ADL  Lower Body Dressing: Supervision/safety Where Assessed - Lower Body Dressing: Unsupported sit to stand Toilet Transfer: Supervision/safety Toilet Transfer Method: Other (comment) (ambulaitng) Equipment Used: Gait belt Transfers/Ambulation Related to ADLs: Pt ambulating with incr left LE weakness compared to prevoius session. pt reports feeling very fatigued all over. Pt states :its like my body wants to go to sleep  too"     OT Diagnosis:    OT Problem List:   OT Treatment Interventions:     OT Goals(current goals can now be found in the care plan section) Acute Rehab OT Goals Patient Stated Goal: go home and go dancing OT Goal Formulation: With patient Time For Goal Achievement: 04/14/13 Potential to Achieve Goals: Good ADL Goals Pt Will Perform Tub/Shower Transfer: Tub transfer;with supervision;ambulating Additional ADL Goal #1: Pt will complete 2 step command to complete adl task Additional ADL Goal #2: Pt will savenger hunt ( locate 3 objects on unit) with only being given clues to problem solve MOD I  Visit Information  Last OT Received On: 04/02/13 Assistance Needed: +1 History of Present Illness:  62 year old male admitted 03/30/13 with left lower facial weakness. PMH significant for CVA, HTN, DM, + tobacco, difficulty with pills this morning, failed RNSSS. CT results indicate no hemorrhage, mild atrophy, small lacunar infarct in the right thalamus, decreased attentuation in the right basal ganglia. MRI (+) Acute nonhemorrhagic infarct of the right putamen and extending superiorly to the corona radiata. 2. Much smaller focal area of acute nonhemorrhagic infarction in the left corona radiata.3. Remote lacunar infarcts of the basal ganglia bilaterally, right greater than left. 4. Remote lacunar infarct of the left paramedian midbrain.     Subjective Data      Prior Functioning       Cognition  Cognition Arousal/Alertness: Awake/alert Behavior During Therapy: WFL for tasks assessed/performed Overall Cognitive Status: Impaired/Different from baseline Area of Impairment: Attention Following Commands: Follows multi-step commands with increased time Safety/Judgement: Decreased awareness of deficits Awareness: Anticipatory General Comments: demonstrating improvement with cognitive deficits. Improved L  movment when attended to L    Mobility  Transfers Transfers: Sit to Stand;Stand to  Sit Sit to Stand: 5: Supervision Stand to Sit: 5: Supervision    Exercises  Other Exercises Other Exercises: B U and LE AROMcoordination and B integrated coordiantion tasks   Balance Standardized Balance Assessment Standardized Balance Assessment: Dynamic Gait Index Dynamic Gait Index Level Surface: Normal Change in Gait Speed: Normal Gait with Horizontal Head Turns: Normal Gait with Vertical Head Turns: Mild Impairment Gait and Pivot Turn: Mild Impairment Step Over Obstacle: Normal Step Around Obstacles: Normal Steps: Mild Impairment Total Score: 21 High Level Balance High Level Balance Activites: Backward walking;Direction changes;Turns;Sudden stops;Head turns High Level Balance Comments: Pt dancing to Melinda Crutch   End of Session OT - End of Session Equipment Utilized During Treatment: Gait belt Activity Tolerance: Patient tolerated treatment well Patient left: in chair;with call bell/phone within reach;with chair alarm set;with family/visitor present Nurse Communication: Mobility status;Precautions  GO     Rex Magee,HILLARY 04/02/2013, 5:00 PM [facility], OTR/L  [phone] 04/02/2013

## 2013-04-02 NOTE — Progress Notes (Signed)
Subjective: The patient is sleeping upon entry but easily awakes. He is not having any complaints today and seems to be still having some swallowing troubles and drooling. He keeps a towel in his mouth corner to keep the drool from falling. He is still working with therapy but understands that he may need to go somewhere for therapy and is agreeable if we can find him a place. Denies chest pain, SOB, nausea, vomiting, diarrhea.   Objective: Vital signs in last 24 hours: Filed Vitals:   04/01/13 2200 04/02/13 0200 04/02/13 0549 04/02/13 1001  BP: 155/96 161/96 169/82 165/89  Pulse: 79 87 88 78  Temp: 98.6 F (37 C) 98.1 F (36.7 C) 97.9 F (36.6 C) 98.2 F (36.8 C)  TempSrc: Oral Oral Oral Oral  Resp: 20 18 20 20   Height:      Weight:      SpO2: 94% 96% 96% 98%   Weight change:  No intake or output data in the 24 hours ending 04/02/13 1125 General: resting in bed HEENT: PERRL, EOMI, no scleral icterus Cardiac: RRR, no rubs, murmurs or gallops Pulm: clear to auscultation bilaterally, moving normal volumes of air Abd: soft, nontender, nondistended, BS present Ext: warm and well perfused, no pedal edema Neuro: alert and oriented X3, some dysarthria, drooling, left facial droop with forehead sparing, decreased rapidly alternating movements on the left, strength normal upper and lower extremity  Lab Results: Basic Metabolic Panel:  Recent Labs Lab 03/30/13 1141  NA 138  K 4.2  CL 100  CO2 27  GLUCOSE 231*  BUN 10  CREATININE 0.75  CALCIUM 9.7   CBC:  Recent Labs Lab 03/30/13 1141  WBC 6.3  NEUTROABS 2.5  HGB 16.7  HCT 48.4  MCV 81.9  PLT 219   CBG:  Recent Labs Lab 03/31/13 1641 03/31/13 2152 04/01/13 0736 04/01/13 1156 04/01/13 1644 04/02/13 0647  GLUCAP 132* 140* 181* 197* 113* 239*   Hemoglobin A1C:  Recent Labs Lab 03/31/13 0620  HGBA1C 9.5*   Fasting Lipid Panel:  Recent Labs Lab 03/31/13 0620  CHOL 275*  HDL 38*  LDLCALC 197*    TRIG 202*  CHOLHDL 7.2   Studies/Results: Dg Chest 2 View  03/31/2013   CLINICAL DATA:  Possible lung mass.  EXAM: CHEST  2 VIEW  COMPARISON:  PA and lateral chest 03/30/2013.  FINDINGS: No pulmonary nodule or mass is identified. The lungs appear clear. Heart size is normal. No pneumothorax or pleural fluid. Remote distal right clavicle fracture is identified.  IMPRESSION: Negative for evidence of neoplasm. No acute finding.   Electronically Signed   By: Drusilla Kanner M.D.   On: 03/31/2013 22:44   Medications: I have reviewed the patient's current medications. Scheduled Meds: . aspirin  300 mg Rectal Daily   Or  . aspirin  325 mg Oral Daily  . atorvastatin  80 mg Oral q1800  . carvedilol  3.125 mg Oral BID WC  . enoxaparin (LOVENOX) injection  40 mg Subcutaneous Q24H  . glipiZIDE  5 mg Oral QAC breakfast  . insulin aspart  0-9 Units Subcutaneous TID WC  . lisinopril  20 mg Oral Daily  . metFORMIN  850 mg Oral Q breakfast  . nicotine  14 mg Transdermal Daily   Continuous Infusions:  PRN Meds:.food thickener Assessment/Plan:  Acute ischemic stroke - The patient is still on Dysphagia III diet with honey thick liquids. He is working with PT and OT daily and they are still recommending  a SNF for level of care. We are working with SW to help with placement although it may be difficult given lack of insurance. He does have risk factors of diabetes, HTN, HPL. He has new CHF systolic failure EF 30-35 % found on echo. ASA, statin, beta-blocker, diabetes management.   Type II diabetes mellitus - Are starting back oral regimen as the patient is adament about not taking insulin ever. He will need aggressive titration of this regimen as it is NOT controlling his diabetes. HgA1c this admission 9.5.  Hypertension - BPs still elevated today to 165/89 and will titrate up his blood pressure regimen gradually as he likely has chronic uncontrolled hypertension and could be more detrimental to control  him aggressively versus slower control.  -Coreg 3.125 mg BID, lisinopril 20 mg daily  Asymptomatic LV dysfunction - New onset of systolic heart failure with EF 30-35%. Does not need diuretic at this time. Is on beta-blocker (room to increase with HR 78 this AM), ACE-I (not maximal dose), ASA, statin. Will follow up with cardiology for catheterization as out-patient in 4-6 weeks.   Hyperlipidemia - Statin high potency (lipitor 80 mg daily).   Tobacco Abuse - Nicotine patch and encouraged smoking cessation.  DVT ppx - lovenox [place] daily and ASA for secondary stroke prevention  Dispo: Disposition is deferred at this time, awaiting improvement of current medical problems.  Anticipated discharge in approximately 1-2 day(s).   The patient does not have a current PCP (No primary provider on file.) and does need an [facility] hospital follow-up appointment after discharge.  The patient does not have transportation limitations that hinder transportation to clinic appointments.  .Services Needed at time of discharge: Y = Yes, Blank = No PT:   OT:   RN:   Equipment:   Other:     LOS: 3 days   Judie Bonus, MD 04/02/2013, 11:25 AM

## 2013-04-02 NOTE — Progress Notes (Signed)
At shift change and while giving report, was informed by day nurse that pt stated he fell last night between 0100 and 0500 on 04/02/13. Pt informed his family that he fell. Upon further assessment, pt did not fall. He hit his head on the toilet seat while washing his feet. He was sitting on the [facility] and bent down too far and accidentally hit his head on the toilet seat. Asked pt why he did not inform me last night of this occurrence and stated he "didn't want to get anyone in trouble". Informed pt that all occurrences of this nature should be reported to his RN. The nurse tech was in the bathroom with him during this time and he stated "she wasn't fast enough but not to tell". I have not spoken with the nurse tech, as she is not here. Denies HA, pain, no confusion, no other symptoms. Will continue to monitor.

## 2013-04-03 LAB — GLUCOSE, CAPILLARY
Glucose-Capillary: 148 mg/dL — ABNORMAL HIGH (ref 70–99)
Glucose-Capillary: 105 mg/dL — ABNORMAL HIGH (ref 70–99)
Glucose-Capillary: 104 mg/dL — ABNORMAL HIGH (ref 70–99)

## 2013-04-03 MED ORDER — CARVEDILOL 6.25 MG PO TABS
6.2500 mg | ORAL_TABLET | Freq: Two times a day (BID) | ORAL | Status: DC
  Administered 2013-04-03 – 2013-04-04 (×2): 6.25 mg via ORAL
  Filled 2013-04-03 (×4): qty 1

## 2013-04-03 MED ORDER — LOPERAMIDE HCL 2 MG PO CAPS
2.0000 mg | ORAL_CAPSULE | ORAL | Status: DC | PRN
  Administered 2013-04-03: 4 mg via ORAL
  Filled 2013-04-03: qty 2

## 2013-04-03 NOTE — Progress Notes (Signed)
Clinical Social Work Department BRIEF PSYCHOSOCIAL ASSESSMENT 04/03/2013  Patient:  Jerry Graham,Jerry Graham     Account Number:  [id number]     Admit date:  03/30/2013  Clinical Social Worker:  Hendricks Milo  Date/Time:  04/03/2013 06:31 PM  Referred by:  Physician  Date Referred:  04/03/2013 Referred for  SNF Placement   Other Referral:   Interview type:  Patient Other interview type:    PSYCHOSOCIAL DATA Living Status:  ALONE Admitted from facility:   Level of care:   Primary support name:  He reported that his family is in [place] Primary support relationship to patient:   Degree of support available:    CURRENT CONCERNS  Other Concerns:    SOCIAL WORK ASSESSMENT / PLAN Patient was agreeable to SNF search in [place] and [place]. CSW explained to patient that due to his lack on insurance his choices would be limited and he allowed CSW to expand search into [facility].   Assessment/plan status:  Psychosocial Support/Ongoing Assessment of Needs Other assessment/ plan:   Information/referral to community resources:   CSW gave patient SNF list.    PATIENT'S/FAMILY'S RESPONSE TO PLAN OF CARE: Patient verbalized his understanding of limited beds because of his lack on insurance. Patient agreeable to fax out to [place] and [facility].

## 2013-04-03 NOTE — Progress Notes (Addendum)
Clinical Social Work Department CLINICAL SOCIAL WORK PLACEMENT NOTE 04/03/2013  Patient:  Jerry Graham,Jerry Graham  Account Number:  [id number] Admit date:  03/30/2013  Clinical Social Worker:  Jetta Lout, Theresia Majors  Date/time:  04/03/2013 06:37 PM  Clinical Social Work is seeking post-discharge placement for this patient at the following level of care:   SKILLED NURSING   (*CSW will update this form in Epic as items are completed)   04/03/2013  Patient/family provided with [facility] Department of Clinical Social Work's list of facilities offering this level of care within the geographic area requested by the patient (or if unable, by the patient's family).  04/03/2013  Patient/family informed of their freedom to choose among providers that offer the needed level of care, that participate in Medicare, Medicaid or managed care program needed by the patient, have an available bed and are willing to accept the patient.  04/03/2013  Patient/family informed of [facility]' ownership interest in [facility], as well as of the fact that they are under no obligation to receive care at this facility.  PASARR submitted to EDS on 04/03/2013 PASARR number received from EDS on 04/03/2013  FL2 transmitted to all facilities in geographic area requested by pt/family on  04/03/2013 FL2 transmitted to all facilities within larger geographic area on   Patient informed that his/her managed care company has contracts with or will negotiate with  certain facilities, including the following:     Patient/family informed of bed offers received:   Patient chooses bed at  Physician recommends and patient chooses bed at    Patient discharged home on 04/04/13   Patient to be transported home by vehicle.  The following physician request were entered in Epic:   Additional Comments: Patient agreeable to SNF search in [place] and [place].

## 2013-04-03 NOTE — Progress Notes (Signed)
   SUBJECTIVE:  No chest pain.  No SOB   PHYSICAL EXAM Filed Vitals:   04/02/13 1411 04/02/13 1804 04/02/13 2148 04/03/13 0618  BP: 146/86 141/90 160/87 152/93  Pulse: 89 74 81 81  Temp: 98.1 F (36.7 C) 98.2 F (36.8 C) 98.1 F (36.7 C) 98 F (36.7 C)  TempSrc: Oral Oral Oral Oral  Resp: 20 18 20 18   Height:      Weight:      SpO2: 98% 98% 95% 99%   General:  No distress Lungs:  Clear Heart:  RRR Abdomen:  Positive bowel sounds, no rebound no guarding Extremities:  No edema  LABS:  Results for orders placed during the hospital encounter of 03/30/13 (from the past 24 hour(s))  GLUCOSE, CAPILLARY     Status: Abnormal   Collection Time    04/02/13 12:28 PM      Result Value Range   Glucose-Capillary 162 (*) 70 - 99 mg/dL   Comment 1 Notify RN     Comment 2 Documented in Chart    GLUCOSE, CAPILLARY     Status: Abnormal   Collection Time    04/02/13  4:40 PM      Result Value Range   Glucose-Capillary 100 (*) 70 - 99 mg/dL  GLUCOSE, CAPILLARY     Status: Abnormal   Collection Time    04/02/13  9:46 PM      Result Value Range   Glucose-Capillary 190 (*) 70 - 99 mg/dL   Comment 1 Documented in Chart     Comment 2 Notify RN    GLUCOSE, CAPILLARY     Status: Abnormal   Collection Time    04/03/13  6:33 AM      Result Value Range   Glucose-Capillary 148 (*) 70 - 99 mg/dL   Comment 1 Documented in Chart     Comment 2 Notify RN     No intake or output data in the 24 hours ending 04/03/13 0939   ASSESSMENT AND PLAN:  Acute ischemic stroke:  Working with PT and OT.    Type II diabetes mellitus:  Refuses IV therapy.  Management per primary team.    Hypertension:  This is being managed in the context of treating his CHF    Cardiomyopathy:  Probable outpatient cardiac cath once he is further recovered from his acute illness.  I will increase the beta blocker today.     Jerry Graham 04/03/2013 9:39 AM

## 2013-04-03 NOTE — Progress Notes (Addendum)
Subjective: Patient seen at bedside. He is awake and alert. He says his face feels the same, no new weakness or numbness. Family is in town from [place], [place], so I spent about 30 minutes updating them. They are concerned about primary care follow up as the patient lives in [place], not in [place]. I told them we would look into options for him out there. They are also rightly concerned about him getting only home health PT instead of the more appropriate CIR or SNF, but given his insurance status our hands may be tied.  Objective: Vital signs in last 24 hours: Filed Vitals:   04/02/13 1411 04/02/13 1804 04/02/13 2148 04/03/13 0618  BP: 146/86 141/90 160/87 152/93  Pulse: 89 74 81 81  Temp: 98.1 F (36.7 C) 98.2 F (36.8 C) 98.1 F (36.7 C) 98 F (36.7 C)  TempSrc: Oral Oral Oral Oral  Resp: 20 18 20 18   Height:      Weight:      SpO2: 98% 98% 95% 99%   Weight change:  No intake or output data in the 24 hours ending 04/03/13 0752  General: resting in bed HEENT: PERRL, EOMI, no scleral icterus Cardiac: RRR, no rubs, murmurs or gallops Pulm: clear to auscultation bilaterally, moving normal volumes of air Abd: soft, nontender, nondistended, BS present Ext: warm and well perfused, no pedal edema Neuro: alert and oriented X3, some dysarthria, drooling, left facial droop with forehead sparing, decreased rapidly alternating movements on the left, strength normal upper and lower extremity  Lab Results: Basic Metabolic Panel:  Recent Labs Lab 03/30/13 1141  NA 138  K 4.2  CL 100  CO2 27  GLUCOSE 231*  BUN 10  CREATININE 0.75  CALCIUM 9.7   CBC:  Recent Labs Lab 03/30/13 1141  WBC 6.3  NEUTROABS 2.5  HGB 16.7  HCT 48.4  MCV 81.9  PLT 219   CBG:  Recent Labs Lab 04/01/13 1644 04/02/13 0647 04/02/13 1228 04/02/13 1640 04/02/13 2146 04/03/13 0633  GLUCAP 113* 239* 162* 100* 190* 148*   Hemoglobin A1C:  Recent Labs Lab 03/31/13 0620  HGBA1C 9.5*    Fasting Lipid Panel:  Recent Labs Lab 03/31/13 0620  CHOL 275*  HDL 38*  LDLCALC 197*  TRIG 202*  CHOLHDL 7.2   Studies/Results: No results found. Medications: I have reviewed the patient's current medications. Scheduled Meds: . aspirin  300 mg Rectal Daily   Or  . aspirin  325 mg Oral Daily  . atorvastatin  80 mg Oral q1800  . carvedilol  3.125 mg Oral BID WC  . enoxaparin (LOVENOX) injection  40 mg Subcutaneous Q24H  . glipiZIDE  5 mg Oral QAC breakfast  . insulin aspart  0-9 Units Subcutaneous TID WC  . lisinopril  20 mg Oral Daily  . metFORMIN  850 mg Oral Q breakfast  . nicotine  14 mg Transdermal Daily   Continuous Infusions:  PRN Meds:.food thickener Assessment/Plan:  Acute ischemic stroke - The patient is still on Dysphagia III diet with honey thick liquids. He is working with PT daily and they are still recommending 24-hour assistance for level of care. We are working with SW to help with placement although it may be difficult given lack of insurance. He does have risk factors of diabetes, HTN, HPL. He has new CHF systolic failure EF 30-35 % found on echo. Continuing ASA, statin, beta-blocker, diabetes management. Will follow up with SW/CM re: PCPs, neurology, cardiology in [place].  Type II  diabetes mellitus - Starting back oral regimen (metformin, glipizide) as the patient is adament about not taking insulin ever. He will need aggressive titration of this regimen as it is NOT controlling his diabetes well. HgA1c this admission 9.5.  Hypertension - BPs still elevated today to 152/90 and will titrate up his blood pressure regimen gradually as he likely has chronic uncontrolled hypertension and could be more detrimental to control him aggressively versus slower control. Coreg increased to 6.25 mg BID per cards, lisinopril 20 mg daily  Asymptomatic LV dysfunction - New onset of systolic heart failure with EF 30-35%. Does not need diuretic at this time. Is on  beta-blocker (room to increase with HR 78 this AM), ACE-I (not maximal dose), ASA, statin. Will follow up with cardiology for catheterization as out-patient in 4-6 weeks. Appreciate cardiology recs.  Hyperlipidemia - Statin high potency (lipitor 80 mg daily).   Tobacco Abuse - Nicotine patch and encouraged smoking cessation.  DVT ppx - lovenox Sisters daily and ASA for secondary stroke prevention  Dispo: Disposition is deferred at this time, awaiting improvement of current medical problems.  Anticipated discharge in approximately 1-2 day(s).   The patient does not have a current PCP (No primary provider on file.) and does need an [facility] hospital follow-up appointment after discharge.  The patient does not have transportation limitations that hinder transportation to clinic appointments.  .Services Needed at time of discharge: Y = Yes, Blank = No PT:   OT:   RN:   Equipment:   Other:     LOS: 4 days   Vivi Barrack, MD 04/03/2013, 7:52 AM

## 2013-04-04 DIAGNOSIS — I5022 Chronic systolic (congestive) heart failure: Secondary | ICD-10-CM

## 2013-04-04 DIAGNOSIS — I509 Heart failure, unspecified: Secondary | ICD-10-CM

## 2013-04-04 LAB — GLUCOSE, CAPILLARY

## 2013-04-04 MED ORDER — GLIPIZIDE 5 MG PO TABS: 5.0000 mg | ORAL_TABLET | Freq: Every day | ORAL | Status: DC

## 2013-04-04 MED ORDER — ATORVASTATIN CALCIUM 80 MG PO TABS: 80.0000 mg | ORAL_TABLET | Freq: Every day | ORAL | Status: DC

## 2013-04-04 MED ORDER — ASPIRIN 325 MG PO TABS: 325.0000 mg | ORAL_TABLET | Freq: Every day | ORAL | Status: DC

## 2013-04-04 MED ORDER — CARVEDILOL 6.25 MG PO TABS: 6.2500 mg | ORAL_TABLET | Freq: Two times a day (BID) | ORAL | Status: DC

## 2013-04-04 MED ORDER — STARCH (THICKENING) PO POWD: ORAL | Status: DC

## 2013-04-04 MED ORDER — LISINOPRIL 20 MG PO TABS: 20.0000 mg | ORAL_TABLET | Freq: Every day | ORAL | Status: DC

## 2013-04-04 MED ORDER — METFORMIN HCL 850 MG PO TABS: 850.0000 mg | ORAL_TABLET | Freq: Every day | ORAL | Status: DC

## 2013-04-04 NOTE — Progress Notes (Signed)
PROGRESS NOTE  Subjective:   [name] is a 62 yo with DM2, HTN, CVA, tobacco abuse,.  We were consulted to evaluate him for CHF with EF 30-35%.    Objective:    Vital Signs:   Temp:  [98 F (36.7 C)-98.6 F (37 C)] 98.5 F (36.9 C) (09/29 0500) Pulse Rate:  [71-83] 77 (09/29 0500) Resp:  [18-20] 20 (09/29 0500) BP: (140-160)/(87-98) 140/95 mmHg (09/29 0500) SpO2:  [96 %-98 %] 97 % (09/29 0500)  Last BM Date: 04/03/13   24-hour weight change: Weight change:   Weight trends: Filed Weights   03/30/13 1545  Weight: 178 lb 1.6 oz (80.786 kg)    Intake/Output:        Physical Exam: BP 140/95  Pulse 77  Temp(Src) 98.5 F (36.9 C) (Oral)  Resp 20  Ht 5' 10.5" (1.791 m)  Wt 178 lb 1.6 oz (80.786 kg)  BMI 25.19 kg/m2  SpO2 97%  General: Vital signs reviewed and noted.   Head: Normocephalic, atraumatic.  Eyes: conjunctivae/corneas clear.  EOM's intact.   Throat: normal  Neck:  NORMAL   Lungs:    clear  Heart:  RR, normal S1, S2  Abdomen:  Soft, non-tender, non-distended    Extremities: No edema   Neurologic: A&O X3, , slightly dysarthric,  Moves all 4 extremities  Psych: Normal     Labs: BMET: No results found for this basename: NA, K, CL, CO2, GLUCOSE, BUN, CREATININE, CALCIUM, MG, PHOS,  in the last 72 hours  Liver function tests: No results found for this basename: AST, ALT, ALKPHOS, BILITOT, PROT, ALBUMIN,  in the last 72 hours No results found for this basename: LIPASE, AMYLASE,  in the last 72 hours  CBC: No results found for this basename: WBC, NEUTROABS, HGB, HCT, MCV, PLT,  in the last 72 hours  Cardiac Enzymes: No results found for this basename: CKTOTAL, CKMB, TROPONINI,  in the last 72 hours  Coagulation Studies: No results found for this basename: LABPROT, INR,  in the last 72 hours  Other: No components found with this basename: POCBNP,  No results found for this basename: DDIMER,  in the last 72 hours No results found for this  basename: HGBA1C,  in the last 72 hours No results found for this basename: CHOL, HDL, LDLCALC, TRIG, CHOLHDL,  in the last 72 hours No results found for this basename: TSH, T4TOTAL, FREET3, T3FREE, THYROIDAB,  in the last 72 hours No results found for this basename: VITAMINB12, FOLATE, FERRITIN, TIBC, IRON, RETICCTPCT,  in the last 72 hours   Other results:  Tele:  NSR.     Medications:    Infusions:    Scheduled Medications: . aspirin  300 mg Rectal Daily   Or  . aspirin  325 mg Oral Daily  . atorvastatin  80 mg Oral q1800  . carvedilol  6.25 mg Oral BID WC  . enoxaparin (LOVENOX) injection  40 mg Subcutaneous Q24H  . glipiZIDE  5 mg Oral QAC breakfast  . insulin aspart  0-9 Units Subcutaneous TID WC  . lisinopril  20 mg Oral Daily  . metFORMIN  850 mg Oral Q breakfast  . nicotine  14 mg Transdermal Daily    Assessment/ Plan:       Acute ischemic stroke: plans per stroke service     Type II diabetes mellitus    Hypertension:  Continue meds for chf     Chronic systolic CHF:  He will need further evaluation  of this after he rehabs from a stroke standpoint.   He has not had any sigifincant sx of CHF.   He will need a cath as OP.Currently on : Coreg 6.25 BID Lisinopril 20 a day Will continue to follow    Length of Stay: 5  Vesta Mixer, [name]., MD, [facility] 04/04/2013, 7:37 AM Office [phone] Pager [phone]

## 2013-04-04 NOTE — Progress Notes (Signed)
Occupational Therapy Treatment Patient Details Name: Jerry Graham MRN: [id number] DOB: [birth year] Today's Date: 04/04/2013 Time: 7829-5621 OT Time Calculation (min): 26 min  OT Assessment / Plan / Recommendation  History of present illness  62 year old male admitted 03/30/13 with left lower facial weakness. PMH significant for CVA, HTN, DM, + tobacco, difficulty with pills this morning, failed RNSSS. CT results indicate no hemorrhage, mild atrophy, small lacunar infarct in the right thalamus, decreased attentuation in the right basal ganglia. MRI (+) Acute nonhemorrhagic infarct of the right putamen and extending superiorly to the corona radiata. 2. Much smaller focal area of acute nonhemorrhagic infarction in the left corona radiata.3. Remote lacunar infarcts of the basal ganglia bilaterally, right greater than left. 4. Remote lacunar infarct of the left paramedian midbrain.    OT comments  Pt demonstrates high level balance and cognition deficits. Pt when changed with distracting environment ( music) unable to locate room. Pt needed cues. Pt demonstrates LOB to the left during dancing. Pt progressing well and no caregiver support at home. Pt does not have transportation or family to (A) upon d/c. Pt's dc recommendation changed to SNF.  Follow Up Recommendations  SNF (pt does not have (A) or ride to outpatient)    Barriers to Discharge       Equipment Recommendations  None recommended by OT    Recommendations for Other Services    Frequency Min 2X/week   Progress towards OT Goals Progress towards OT goals: Progressing toward goals  Plan Discharge plan needs to be updated    Precautions / Restrictions Precautions Precautions: Fall   Pertinent Vitals/Pain None reported    ADL  Grooming: Wash/dry hands;Wash/dry face;Min guard Where Assessed - Grooming: Unsupported standing Toilet Transfer: Radiographer, therapeutic Method: Sit to Barista: Regular  height toilet Toileting - Clothing Manipulation and Hygiene: Supervision/safety Where Assessed - Engineer, mining and Hygiene: Sit to stand from 3-in-1 or toilet Tub/Shower Transfer: Supervision/safety Tub/Shower Transfer Method: Ambulating Equipment Used: Gait belt Transfers/Ambulation Related to ADLs: Pt ambulating unit with supervision. pt dancing to music with LOB to the right at times. Pt holding with left hand on hand rail to dance due to balance deficits. pt demonstrates ability to side step, walk backward and change speeds. pt demonstrates deficits with change of direction and single leg balance with dancing ADL Comments: pt provided scavenger hunt 4 step command: turn left at second nurses station open middle cabinet remove gait belt and put on gait belt. Pt needed cues for cabinet vs drawers. Pt unable to sustain entire instructions. Pt demonstrates decr recall of gym location but recallled 100% correct tub transfer instructions this session. Pt needed (A) to locate room due to distraction of music. Per RN staff pt unsafe in the bathroom this AM for hygiene. Pt attempting single leg standing and needing (A) for picking objects off floor. pt demonstrated lack of awareness to space when reaching to place trash in trash can pt almost striking head on counter. Pt needed verbal cues to avoid.    OT Diagnosis:    OT Problem List:   OT Treatment Interventions:     OT Goals(current goals can now be found in the care plan section) Acute Rehab OT Goals Patient Stated Goal: go home and go dancing OT Goal Formulation: With patient Time For Goal Achievement: 04/14/13 Potential to Achieve Goals: Good ADL Goals Pt Will Perform Tub/Shower Transfer: Tub transfer;with supervision;ambulating Additional ADL Goal #1: Pt will complete 2 step command  to complete adl task Additional ADL Goal #2: Pt will savenger hunt ( locate 3 objects on unit) with only being given clues to problem solve MOD  I  Visit Information  Last OT Received On: 04/04/13 Assistance Needed: +1 History of Present Illness:  61 year old male admitted 03/30/13 with left lower facial weakness. PMH significant for CVA, HTN, DM, + tobacco, difficulty with pills this morning, failed RNSSS. CT results indicate no hemorrhage, mild atrophy, small lacunar infarct in the right thalamus, decreased attentuation in the right basal ganglia. MRI (+) Acute nonhemorrhagic infarct of the right putamen and extending superiorly to the corona radiata. 2. Much smaller focal area of acute nonhemorrhagic infarction in the left corona radiata.3. Remote lacunar infarcts of the basal ganglia bilaterally, right greater than left. 4. Remote lacunar infarct of the left paramedian midbrain.     Subjective Data      Prior Functioning       Cognition  Cognition Arousal/Alertness: Awake/alert Behavior During Therapy: WFL for tasks assessed/performed Overall Cognitive Status: Impaired/Different from baseline Area of Impairment: Memory;Awareness Memory: Decreased short-term memory Following Commands: Follows multi-step commands with increased time Safety/Judgement: Decreased awareness of deficits General Comments: Pt demonstrates good recall of tub transfer but remains with impulsive behavior and cues to decr speed. Pt remains good SNF candidate at this time.    Mobility  Bed Mobility Bed Mobility: Supine to Sit;Sitting - Scoot to Edge of Bed Supine to Sit: 5: Supervision;HOB flat Sitting - Scoot to Edge of Bed: 5: Supervision Sit to Supine: 5: Supervision;HOB flat Transfers Transfers: Sit to Stand;Stand to Sit Sit to Stand: 5: Supervision;From bed Stand to Sit: 5: Supervision;To bed    Exercises      Balance High Level Balance High Level Balance Activites: Backward walking;Direction changes;Turns;Head turns;Sudden stops High Level Balance Comments: Pt demonstrates LOB with single leg standing and with head turns when dancing. pt  grabbing wall or hand rail with left hand. Pt dancing to [employer], supremes and temptations during session. pt motivated by music. Pt remains with high level balance deficits   End of Session OT - End of Session Equipment Utilized During Treatment: Gait belt Activity Tolerance: Patient tolerated treatment well Patient left: in bed;with call bell/phone within reach Nurse Communication: Mobility status  GO   Pt requesting that therapist stay to play music. Pt encouraged to ambulate with other staff throughout the day and that PT session later this on today.  Boone Master B 04/04/2013, 11:43 AM Pager: [phone]

## 2013-04-04 NOTE — Progress Notes (Signed)
  Date: 04/04/2013  Patient name: Jerry Graham  Medical record number: [id number]  Date of birth: 1950-08-05   This patient has been seen and the plan of care was discussed with the house staff. Please see their note for complete details. I concur with their findings.   Awaiting SNF placement.  Other issues are currently stable and he continues to work with PT/OT.  Barrier to placement is insurance status.  SW team is assisting the medical team on this case.   Inez Catalina, MD 04/04/2013, 12:00 PM

## 2013-04-04 NOTE — Progress Notes (Signed)
I have reviewed this note and agree with all findings. Kati Lamyra Malcolm, PT, DPT Pager: [phone]   

## 2013-04-04 NOTE — Care Management Note (Signed)
    Page 1 of 2   04/04/2013     3:09:36 PM   CARE MANAGEMENT NOTE 04/04/2013  Patient:  Jerry Graham,Jerry Graham   Account Number:  [id number]  Date Initiated:  04/04/2013  Documentation initiated by:  Elmer Bales  Subjective/Objective Assessment:   Patient admitted for CVA. Lives at home alone     Action/Plan:   Will follow for discharge needs   Anticipated DC Date:  04/04/2013   Anticipated DC Plan:  HOME W HOME HEALTH SERVICES      DC Planning Services  CM consult      Choice offered to / List presented to:  C-1 Patient        HH arranged  HH-2 PT  HH-3 OT  HH-5 SPEECH THERAPY      HH agency  [facility]   Status of service:  Completed, signed off Medicare Important Message given?   (If response is "NO", the following Medicare IM given date fields will be blank) Date Medicare IM given:   Date Additional Medicare IM given:    Discharge Disposition:  HOME W HOME HEALTH SERVICES  Per UR Regulation:  Reviewed for med. necessity/level of care/duration of stay  If discussed at Long Length of Stay Meetings, dates discussed:    Comments:  04/04/13 1445 Elmer Bales RN, MSN, CM- Met with patient to discuss discharge needs. Pt was given a list of free clinics in [place], [place] and was instructed to call and make an appointment immediately to establish care.  Patient is agreeable to home health PT/OT/SLP at discharge.  Debbie with [facility] was notified of referral, as patient does not have insurance.  Eunice Blase is looking in to whether all 3 services will be available in his area. At this time, it does not look like patient will have access to OT. [facility] is aware that Dr Claudell Kyle with [facility] is willing to sign home health orders until patient has established a PCP. Patient states that his home address is [address] in [place], [place].  His phone number is [phone].

## 2013-04-04 NOTE — Progress Notes (Signed)
Physical Therapy Treatment Patient Details Name: Jerry Graham MRN: [id number] DOB: 09/20/1950 Today's Date: 04/04/2013 Time: 4098-1191 PT Time Calculation (min): 28 min  PT Assessment / Plan / Recommendation  History of Present Illness  62 year old male admitted 03/30/13 with left lower facial weakness. PMH significant for CVA, HTN, DM, + tobacco, difficulty with pills this morning, failed RNSSS. CT results indicate no hemorrhage, mild atrophy, small lacunar infarct in the right thalamus, decreased attentuation in the right basal ganglia. MRI (+) Acute nonhemorrhagic infarct of the right putamen and extending superiorly to the corona radiata. 2. Much smaller focal area of acute nonhemorrhagic infarction in the left corona radiata.3. Remote lacunar infarcts of the basal ganglia bilaterally, right greater than left. 4. Remote lacunar infarct of the left paramedian midbrain.    PT Comments   Pt moving well.  Performs higher level balance activities & dances to music throughout hallway with no LOB noted.   Does demonstrate some decreased insight to deficits & impulsiveness but ? If this is baseline.    At this time, feel pt would be appropriate to d/c home with HHPT.  D/c plans updated.     Follow Up Recommendations  Home health PT     Does the patient have the potential to tolerate intense rehabilitation     Barriers to Discharge        Equipment Recommendations       Recommendations for Other Services    Frequency Min 4X/week   Progress towards PT Goals Progress towards PT goals: Progressing toward goals  Plan      Precautions / Restrictions Precautions Precautions: Fall Restrictions Weight Bearing Restrictions: No   Pertinent Vitals/Pain No pain reported.      Mobility  Bed Mobility Bed Mobility: Supine to Sit;Sitting - Scoot to Edge of Bed Supine to Sit: 6: Modified independent (Device/Increase time) Sitting - Scoot to Edge of Bed: 6: Modified independent (Device/Increase  time) Sit to Supine: 5: Supervision;HOB flat Transfers Transfers: Sit to Stand;Stand to Sit;Stand Pivot Transfers Sit to Stand: 6: Modified independent (Device/Increase time);With upper extremity assist;From bed Stand to Sit: 6: Modified independent (Device/Increase time);With upper extremity assist;With armrests;To bed;To chair/3-in-1 Ambulation/Gait Ambulation/Gait Assistance: 5: Supervision Ambulation Distance (Feet): 800 Feet Assistive device: None Ambulation/Gait Assistance Details: Pt ambulating unit x 2 with no LOB noted.  Pt able to perform various gait challenges as well as dancing to music throughout hallway.  At times pt does require increase guarding when dancing at times due to being impulsive & making quick movements but still no LOB noted.   Gait Pattern: Step-through pattern Stairs: No Wheelchair Mobility Wheelchair Mobility: No      PT Goals (current goals can now be found in the care plan section) Acute Rehab PT Goals Patient Stated Goal: go home and go dancing PT Goal Formulation: With patient Time For Goal Achievement: 04/14/13 Potential to Achieve Goals: Good  Visit Information  Last PT Received On: 04/04/13 Assistance Needed: +1 History of Present Illness:  62 year old male admitted 03/30/13 with left lower facial weakness. PMH significant for CVA, HTN, DM, + tobacco, difficulty with pills this morning, failed RNSSS. CT results indicate no hemorrhage, mild atrophy, small lacunar infarct in the right thalamus, decreased attentuation in the right basal ganglia. MRI (+) Acute nonhemorrhagic infarct of the right putamen and extending superiorly to the corona radiata. 2. Much smaller focal area of acute nonhemorrhagic infarction in the left corona radiata.3. Remote lacunar infarcts of the basal ganglia bilaterally,  right greater than left. 4. Remote lacunar infarct of the left paramedian midbrain.     Subjective Data  Patient Stated Goal: go home and go dancing    Cognition  Cognition Arousal/Alertness: Awake/alert Behavior During Therapy: WFL for tasks assessed/performed Overall Cognitive Status: Impaired/Different from baseline Area of Impairment: Memory;Awareness Memory: Decreased short-term memory Following Commands: Follows multi-step commands with increased time Safety/Judgement: Decreased awareness of deficits General Comments: Pt demonstrates good recall of tub transfer but remains with impulsive behavior and cues to decr speed. Pt remains good SNF candidate at this time.    Balance  High Level Balance High Level Balance Activites: Backward walking;Side stepping;Direction changes;Turns;Sudden stops;Head turns High Level Balance Comments: Pt demonstrates LOB with single leg standing and with head turns when dancing. pt grabbing wall or hand rail with left hand. Pt dancing to [employer], supremes and temptations during session. pt motivated by music. Pt remains with high level balance deficits  End of Session     GP     Lara Mulch 04/04/2013, 2:53 PM   Verdell Face, PTA [phone] 04/04/2013

## 2013-04-04 NOTE — Clinical Social Work Note (Signed)
CSW talked with patient regarding bed offers and a Medicaid application. Jerry Graham moved from [place], [place] to [place], [place] and reports that he has an aunt that lives in [place]. Patient reported that he does have Medicaid in [place], [place], along with SSA retirement income, but went to Social Services in [place] to apply and was advised that he was not eligible for Medicaid. Patient reported that a financial counselor Jerry Graham [phone] came to see him last week but he had not seen her since then. Patient advised that he did not giet any bed offers, however we would not be able to assist with SNF placement without a completed Medicaid application. CSW contacted financial couselor Jerry Graham regarding patient and was advised that she had not reviewed his information yet. CSW later spoke with MD and updated her of no bed offers and no pending Medicaid application.  Patient discharged home today with home health services.  Jerry Graham, MSW, LCSW [phone]

## 2013-04-04 NOTE — Discharge Summary (Signed)
Name: Jerry Graham MRN: [id number] DOB: 01-03-1951 62 y.o. PCP: No primary provider on file.  Date of Admission: 03/30/2013 11:00 AM Date of Discharge: 04/04/2013 Attending Physician: Inez Catalina, MD  Discharge Diagnosis:  Principal Problem:   Acute ischemic stroke Active Problems:   Type II diabetes mellitus   Hypertension   Asymptomatic LV dysfunction   HLD   Tobacco abuse  Discharge Medications:   Medication List         aspirin 325 MG tablet  Take 1 tablet (325 mg total) by mouth daily.     atorvastatin 80 MG tablet  Commonly known as:  LIPITOR  Take 1 tablet (80 mg total) by mouth daily at 6 PM.     carvedilol 6.25 MG tablet  Commonly known as:  COREG  Take 1 tablet (6.25 mg total) by mouth 2 (two) times daily with a meal.     food thickener Powd  Commonly known as:  THICK IT  Please use as directed to make liquids honey thick     glipiZIDE 5 MG tablet  Commonly known as:  GLUCOTROL  Take 1 tablet (5 mg total) by mouth daily.     lisinopril 20 MG tablet  Commonly known as:  PRINIVIL,ZESTRIL  Take 1 tablet (20 mg total) by mouth daily.     metFORMIN 850 MG tablet  Commonly known as:  GLUCOPHAGE  Take 1 tablet (850 mg total) by mouth daily.        Disposition and follow-up:   Jerry Graham was discharged from [facility] in stable condition.  At the hospital follow up visit please address:  1.  Medication compliance, progress with home health PT/OT/SLP.  2.  Labs / imaging needed at time of follow-up: Cardiac cath in 4-6 weeks  3.  Pending labs/ test needing follow-up: None  Follow-up Appointments: Follow-up Information   Follow up with Elyn Aquas., MD On 05/09/2013. (@10 :30am. This is your heart doctor.)    Specialty:  Cardiology   Contact information:   [address] [place] [address] [phone]       Follow up with JAFFE, ADAM Trentan, DO On 04/19/2013. (@1 :30pm. This is your stroke  doctor. There will be a $185 co-pay for this visit unless you set up insurance beforehand.)    Specialty:  Neurology   Contact information:   [address] [place] [address] [phone]       Discharge Instructions: Discharge Orders   Future Appointments Provider Department Dept Phone   04/19/2013 2:00 PM Adam Johnathon Olden, DO [facility] [phone]   05/09/2013 10:30 AM Vesta Mixer, MD [facility] [phone]   Future Orders Complete By Expires   Diet - low sodium heart healthy  As directed    Increase activity slowly  As directed       Consultations: Treatment Team:  Rounding [facility], MD  Procedures Performed:  Dg Chest 2 View  03/31/2013   CLINICAL DATA:  Possible lung mass.  EXAM: CHEST  2 VIEW  COMPARISON:  PA and lateral chest 03/30/2013.  FINDINGS: No pulmonary nodule or mass is identified. The lungs appear clear. Heart size is normal. No pneumothorax or pleural fluid. Remote distal right clavicle fracture is identified.  IMPRESSION: Negative for evidence of neoplasm. No acute finding.   Electronically Signed   By: Drusilla Kanner M.D.   On: 03/31/2013 22:44   Dg Chest 2 View  03/30/2013  CLINICAL DATA:  Stroke  EXAM: CHEST  2 VIEW  COMPARISON:  None.  FINDINGS: Cardiomediastinal silhouette is unremarkable. Elevation of the right hemidiaphragm is noted. No acute infiltrate or pulmonary edema. Bony thorax is unremarkable.  IMPRESSION: No active cardiopulmonary disease. Elevation of the right hemidiaphragm.   Electronically Signed   By: Natasha Mead   On: 03/30/2013 20:12   Ct Head Wo Contrast  03/30/2013   CLINICAL DATA:  Headache, vertigo  EXAM: CT HEAD WITHOUT CONTRAST  TECHNIQUE: Contiguous axial images were obtained from the base of the skull through the vertex without intravenous contrast.  COMPARISON:  None.  FINDINGS: No skull fracture is noted. Paranasal sinuses and mastoid air cells are unremarkable.  No intracranial  hemorrhage, mass effect or midline shift. Mild cerebral atrophy. Mild periventricular and patchy subcortical white matter decreased attenuation probable due to chronic small vessel ischemic changes. There is small lacunar infarct measures about 5 mm in right thalamus. There is small area of decreased attenuation in right basal ganglia. Measures about 1.2 cm. This may be due to ischemia of indeterminate age. Clinical correlation is necessary. If recent ischemia suspected further correlation with brain MRI with diffusion imaging is recommended. No definite acute cortical infarction. No mass lesion is noted on this unenhanced scan.  IMPRESSION: No intracranial hemorrhage, mass effect or midline shift. Mild cerebral atrophy.There is small lacunar infarct measures about 5 mm in right thalamus. There is small area of decreased attenuation in right basal ganglia. Measures about 1.2 cm. This may be due to ischemia of indeterminate age. Clinical correlation is necessary. If recent ischemia suspected further correlation with brain MRI with diffusion imaging is recommended. No definite acute cortical infarction.   Electronically Signed   By: Natasha Mead   On: 03/30/2013 13:07   Mr Brain Wo Contrast  03/30/2013   CLINICAL DATA:  Left facial droop.  Difficulty swallowing. Stroke.  EXAM: MRI HEAD WITHOUT CONTRAST  MRA HEAD WITHOUT CONTRAST  TECHNIQUE: Multiplanar, multiecho pulse sequences of the brain and surrounding structures were obtained without intravenous contrast. Angiographic images of the head were obtained using MRA technique without contrast.  COMPARISON:  CT head without contrast from the same day.  FINDINGS: MRI HEAD FINDINGS  An acute nonhemorrhagic infarct is present within the right putamen extending superiorly into the corona radiata. There is a focal area of restricted diffusion in the left corona radiata as well. Atrophy and extensive white matter disease is advanced for age. Remote lacunar infarcts are  present in the basal ganglia bilaterally, more prominent on the right. There are remote lacunar infarcts adjacent to the atrium of the left lateral ventricle. The remote lacunar infarct is present just to the left of midline within the mid brain.  Flow is present in the major intracranial arteries. The globes and orbits are intact. The study is mildly degraded by patient motion. Chronic mucosal thickening is noted in the right sphenoid sinus. There is a polyp or mucous retention cyst at the floor of the left maxillary sinus. The paranasal sinuses and mastoid air cells are otherwise clear.  MRA HEAD FINDINGS  Moderate stenosis is present in the left cavernous carotid artery. The right internal carotid artery is within normal limits. There is overall decrease caliber of the left A1 and M1 segments compared to the right. The anterior communicating artery is patent. Moderate stenosis is present within the inferior left M2 branch. There is prominent attenuation MCA branch vessels bilaterally, left greater than right. Asymmetric attenuation of  distal ACA branch vessels is present as well.  The vertebral arteries are codominant. The basilar artery is within normal limits. Mild narrowing of the proximal basilar artery measures less than 50% relative to the distal vessel. Both posterior cerebral arteries originate from the basilar tip. Segmental irregularity is present within the proximal posterior cerebral arteries are bilaterally. A high-grade stenosis is present in the left P2 segment with distal attenuation bilaterally.  IMPRESSION: MRI HEAD IMPRESSION  1. Acute nonhemorrhagic infarct of the right putamen and extending superiorly to the corona radiata. 2. Much smaller focal area of acute nonhemorrhagic infarction in the left corona radiata. 3. Age advanced atrophy and diffuse white matter disease likely reflects the sequela of chronic microvascular ischemia. 4. Remote lacunar infarcts of the basal ganglia bilaterally,  right greater than left. 5. Remote lacunar infarct of the left paramedian midbrain.  MRA HEAD IMPRESSION  1. 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 2. Proximal inferior M2 branch vessel stenoses bilaterally, left greater than right. 3. Moderate diffuse small vessel disease. 4. Mild narrowing of the proximal basilar artery. 5. High-grade stenosis of the distal left P2 segment. These results were called by telephone at the time of interpretation on 03/30/2013 at 8:17 PM to [facility], RN 4N, who verbally acknowledged these results.   Electronically Signed   By: Gennette Pac   On: 03/30/2013 20:18   Dg Swallowing Func-speech Pathology  03/31/2013   Riley Nearing Deblois, CCC-SLP     03/31/2013 11:18 AM Objective Swallowing Evaluation: Modified Barium Swallowing Study   Patient Details  Name: Garmon Dehn MRN: [id number] Date of Birth: 04/22/1951  Today's Date: 03/31/2013 Time: 1030-1047 SLP Time Calculation (min): 17 min  Past Medical History:  Past Medical History  Diagnosis Date  . Hypertension   . Diabetes mellitus without complication   . Stroke    Past Surgical History: History reviewed. No pertinent past  surgical history. HPI:  62 year old male admitted 03/30/13 with left lower facial  weakness.  PMH significant for CVA, HTN, DM, + tobacco,  difficulty with pills this morning, failed RNSSS.  CT results  indicate no hemorrhage, mild atrophy, small lacunar infarct in  the right thalamus, decreased attentuation in the right basal  ganglia.     Assessment / Plan / Recommendation Clinical Impression  Dysphagia Diagnosis: Moderate oral phase dysphagia;Mild  pharyngeal phase dysphagia Clinical impression: Pt demonstrates a primary oral phase  dysphagia with left lingual and labial weakness with premature  spillage of boluses and moderate oral residuals after the  swallow. There are also pharyngeal sensory deficits  resulting in  penetration/aspration before the swallow with thin liquids with  late sensation and ineficient expectoration. Pt would be able to  tolerate nectar thick liquids except that oral residuals fall to  airway post swallow. Pts cognitive impairments impede timely  adherance to cues and strategies. If pt could tuck chin and  quickly swallow twice without impulsive talking, nectar thick  liquids may be attempted. For now, will recommend dys 3  (mechanical soft) with honey thick liquids until pt demonstrates  improved adherance to strategies. A chin tuck is not particularly  needed with honey thick liquids, but it improves airway closure  for nectar thick liquids.     Treatment Recommendation  Therapy as outlined in treatment plan below    Diet Recommendation Dysphagia 3 (Mechanical Soft);Honey-thick  liquid   Liquid Administration  via: Cup Medication Administration: Whole meds with puree Supervision: Full supervision/cueing for compensatory strategies Compensations: Small sips/bites;Check for pocketing;Multiple dry  swallows after each bite/sip;Clear throat intermittently Postural Changes and/or Swallow Maneuvers: Seated upright 90  degrees;Upright 30-60 min after meal;Chin tuck    Other  Recommendations Oral Care Recommendations: Oral care BID Other Recommendations: Remove water pitcher   Follow Up Recommendations  Inpatient Rehab    Frequency and Duration min 2x/week  2 weeks   Pertinent Vitals/Pain NA    SLP Swallow Goals Patient will utilize recommended strategies during swallow to  increase swallowing safety with: Minimal cueing Swallow Study Goal #2 - Progress: Progressing toward goal   General HPI: 62 year old male admitted 03/30/13 with left lower  facial weakness.  PMH significant for CVA, HTN, DM, + tobacco,  difficulty with pills this morning, failed RNSSS.  CT results  indicate no hemorrhage, mild atrophy, small lacunar infarct in  the right thalamus, decreased attentuation in the right basal   ganglia. Type of Study: Modified Barium Swallowing Study Reason for Referral: Objectively evaluate swallowing function Diet Prior to this Study: Dysphagia 2 (chopped);Nectar-thick  liquids Temperature Spikes Noted: No Respiratory Status: Room air History of Recent Intubation: No Behavior/Cognition: Alert;Cooperative;Pleasant mood Oral Cavity - Dentition: Missing dentition Oral Motor / Sensory Function: Impaired - see Bedside swallow  eval Patient Positioning: Upright in chair Baseline Vocal Quality: Wet Volitional Cough: Strong Volitional Swallow: Able to elicit (with effort anc ues) Anatomy: Within functional limits Pharyngeal Secretions: Not observed secondary MBS    Reason for Referral Objectively evaluate swallowing function   Oral Phase Oral Preparation/Oral Phase Oral Phase: Impaired Oral - Honey Oral - Honey Cup: Left anterior bolus loss;Weak lingual  manipulation;Left pocketing in lateral sulci;Lingual/palatal  residue;Delayed oral transit Oral - Nectar Oral - Nectar Cup: Left anterior bolus loss;Weak lingual  manipulation;Left pocketing in lateral sulci;Lingual/palatal  residue;Delayed oral transit Oral - Thin Oral - Thin Cup: Left anterior bolus loss;Weak lingual  manipulation;Left pocketing in lateral sulci;Lingual/palatal  residue;Delayed oral transit Oral - Solids Oral - Puree: Left anterior bolus loss;Weak lingual  manipulation;Left pocketing in lateral sulci;Lingual/palatal  residue;Delayed oral transit Oral - Regular: Left anterior bolus loss;Weak lingual  manipulation;Left pocketing in lateral sulci;Lingual/palatal  residue;Delayed oral transit Oral - Pill: Left anterior bolus loss;Weak lingual  manipulation;Left pocketing in lateral sulci;Lingual/palatal  residue;Delayed oral transit   Pharyngeal Phase Pharyngeal Phase Pharyngeal Phase: Impaired Pharyngeal - Honey Pharyngeal - Honey Cup: Delayed swallow initiation;Premature  spillage to valleculae Pharyngeal - Nectar Pharyngeal - Nectar Cup:  Delayed swallow initiation;Premature  spillage to pyriform sinuses;Penetration/Aspiration after  swallow;Penetration/Aspiration before swallow;Moderate aspiration Penetration/Aspiration details (nectar cup): Material enters  airway, CONTACTS cords and not ejected out;Material enters  airway, passes BELOW cords without attempt by patient to eject  out (silent aspiration);Material does not enter airway Pharyngeal - Thin Pharyngeal - Thin Cup: Delayed swallow initiation;Premature  spillage to pyriform sinuses;Penetration/Aspiration before  swallow;Penetration/Aspiration after swallow;Moderate aspiration Penetration/Aspiration details (thin cup): Material enters  airway, passes BELOW cords and not ejected out despite cough  attempt by patient Pharyngeal - Solids Pharyngeal - Puree: Delayed swallow initiation Pharyngeal - Regular: Delayed swallow initiation Pharyngeal - Pill: Delayed swallow initiation  Cervical Esophageal Phase    GO    Cervical Esophageal Phase Cervical Esophageal Phase: [facility]        Harlon Ditty, MA CCC-SLP [phone]  Claudine Mouton 03/31/2013, 11:17 AM    Mr Maxine Glenn Head/brain Wo Cm  03/30/2013   CLINICAL DATA:  Left facial  droop.  Difficulty swallowing. Stroke.  EXAM: MRI HEAD WITHOUT CONTRAST  MRA HEAD WITHOUT CONTRAST  TECHNIQUE: Multiplanar, multiecho pulse sequences of the brain and surrounding structures were obtained without intravenous contrast. Angiographic images of the head were obtained using MRA technique without contrast.  COMPARISON:  CT head without contrast from the same day.  FINDINGS: MRI HEAD FINDINGS  An acute nonhemorrhagic infarct is present within the right putamen extending superiorly into the corona radiata. There is a focal area of restricted diffusion in the left corona radiata as well. Atrophy and extensive white matter disease is advanced for age. Remote lacunar infarcts are present in the basal ganglia bilaterally, more prominent on the right. There are remote  lacunar infarcts adjacent to the atrium of the left lateral ventricle. The remote lacunar infarct is present just to the left of midline within the mid brain.  Flow is present in the major intracranial arteries. The globes and orbits are intact. The study is mildly degraded by patient motion. Chronic mucosal thickening is noted in the right sphenoid sinus. There is a polyp or mucous retention cyst at the floor of the left maxillary sinus. The paranasal sinuses and mastoid air cells are otherwise clear.  MRA HEAD FINDINGS  Moderate stenosis is present in the left cavernous carotid artery. The right internal carotid artery is within normal limits. There is overall decrease caliber of the left A1 and M1 segments compared to the right. The anterior communicating artery is patent. Moderate stenosis is present within the inferior left M2 branch. There is prominent attenuation MCA branch vessels bilaterally, left greater than right. Asymmetric attenuation of distal ACA branch vessels is present as well.  The vertebral arteries are codominant. The basilar artery is within normal limits. Mild narrowing of the proximal basilar artery measures less than 50% relative to the distal vessel. Both posterior cerebral arteries originate from the basilar tip. Segmental irregularity is present within the proximal posterior cerebral arteries are bilaterally. A high-grade stenosis is present in the left P2 segment with distal attenuation bilaterally.  IMPRESSION: MRI HEAD IMPRESSION  1. Acute nonhemorrhagic infarct of the right putamen and extending superiorly to the corona radiata. 2. Much smaller focal area of acute nonhemorrhagic infarction in the left corona radiata. 3. Age advanced atrophy and diffuse white matter disease likely reflects the sequela of chronic microvascular ischemia. 4. Remote lacunar infarcts of the basal ganglia bilaterally, right greater than left. 5. Remote lacunar infarct of the left paramedian midbrain.  MRA  HEAD IMPRESSION  1. 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 2. Proximal inferior M2 branch vessel stenoses bilaterally, left greater than right. 3. Moderate diffuse small vessel disease. 4. Mild narrowing of the proximal basilar artery. 5. High-grade stenosis of the distal left P2 segment. These results were called by telephone at the time of interpretation on 03/30/2013 at 8:17 PM to [facility], RN 4N, who verbally acknowledged these results.   Electronically Signed   By: Gennette Pac   On: 03/30/2013 20:18    2D Echo:  ------------------------------------------------------------ Transthoracic Echocardiography  Patient: Geran, Haithcock MR #: [id number] Study Date: 03/31/2013  ------------------------------------------------------------ LV EF: 30% - 35%  ------------------------------------------------------------ Indications: CVA 436.  ------------------------------------------------------------ History: Risk factors: Hypertension. Diabetes mellitus.  ------------------------------------------------------------ Study Conclusions  Left ventricle: The cavity size was normal. There was mild concentric hypertrophy. Systolic function was moderately to severely reduced. The estimated ejection fraction was in  the range of 30% to 35%. Akinesis of the basal-midanteroseptal myocardium. Akinesis of the inferior myocardium. Doppler parameters are consistent with abnormal left ventricular relaxation (grade 1 diastolic dysfunction).  Impressions:  - No clot identified in the apex. LV EF is depressed however. Transthoracic echocardiography. M-mode, complete 2D, spectral Doppler, and color Doppler. Height: Height: 179.1cm. Height: 70.5in. Weight: Weight: 80.8kg. Weight: 177.8lb. Body mass index: BMI: 25.2kg/m^2. Body surface area: BSA: 2.55m^2. Blood pressure: 133/78. Patient status:  Inpatient. Location: Echo laboratory.  ------------------------------------------------------------    Carotid Dopplers:  ------------------------------------------------------------ 03/31/2013  Summary:  - The vertebral arteries appear patent with antegrade flow. - Findings consistent with 1-39 percent stenosis involving the right internal carotid artery and the left internal carotid artery. Other specific details can be found in the table(s) above. Prepared and Electronically Authenticated by  Delia Heady 2014-09-30T10:03:38.850   Admission HPI:  Mr. Kaveh Kissinger is a 62 y.o. male PMH DM2, HTN, tobacco abuse who presents with a left facial droop.  The patient reports he woke up this morning and his "mouth was twisted". He also endorses drooling and trouble swallowing his pills. He got in the car this morning and started driving, and noted he kept drifting into the left lane. He drove back home. He denies chest pain, SOB, abdominal pain, nausea, vomiting, diarrhea, constipation, headache, blurry vision, double vision, visual field cuts, orthopnea, leg swelling. He denies focal weakness or numbness, but does state he felt clumsy on his feet this morning.   He notes the exact same symptoms happened to him last year, with a left sided facial droop and trouble swallowing. He went to the hospital and his symptoms resolved overnight. He was told he had a "mini stroke." He remembers they did a CT of the head and an echocardiogram, among other things. He was living in [place] at the time and went to [facility]. Records have been requested. He does not take a daily aspirin.   He moved to [place] 5 months ago and has not set up with a primary care doctor here. He has some family locally, a cousin. 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 He is a 1/2 PPD smoker and also uses an e-cigarette. He denies alcohol or drug use. 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   Hospital Course by problem list:  1. Acute ischemic stroke - Patient recently relocated to [place] from [place] about 5 months ago. No primary care or insurance. He was admitted on 9/24 with left facial droop with forehead sparing, dysarthria, left hand clumsiness. Found to have Lacunar infarct of right thalamus on MRI. Out of window for TPA. Multiple risk factors for CVA including everyday smoking, HTN, DM2, history of presumed TIA last year per history. Records from [facility] were obtained, and he was actually admitted from 10/04/11 through 10/07/11 with a very similar left-sided facial droop, and found to have a right internal capsule infarct on MRI. Patient left AMA before a full workup (including 2D echo) could be performed. He was discharged from [facility] on Zocor and aspirin but was noncompliant. Here, carotid doppler was wnl. 2D echo showed no clot, but was remarkable for LVEF 30-35% (see problem below). Lipid panel and A1C were both abnormal (See below). PT, OT, and SLP evaluated him and the original consensus was CIR to improve safety and functional status. However due to his  uninsured status, we ended up having to send him home with home health PT/OT/SLP. This is not ideal as he lives alone. Another option is for him to go back to [place], [place] where he has a network of supportive family members who can aid in his rehabilitation and recovery (they even drove down to visit him, and were very involved in his care), as well as Medicare/Medicaid. But the patient wants to stay in [place] at least for now. SLP recommended dysphagia 3 diet with honey thick liquids, so he was discharged with a prescription for Thick-It. We arranged for neurology follow up here at [facility]. Social work also provided him with a list of primary care doctors in [place] who would accept uninsured patients. Patient was encouraged to apply  for Obamacare per care management recommendations.  2. Asymptomatic LV dysfunction - As part of stroke work up an ECHO was performed, and patient was found to have systolic heart failure with EF reduced to 30-35 %, Akinesis of the basal-midanteroseptal myocardium, Akinesis of the inferior myocardium. Given risk factors and regional wall motion abnormalities suspect this is ischemic cardiomyopathy. He denies CP, DOE, orthopnea or PND. Heart failure consult was called. Treated with ASA, ACE-I, b-blockers and statin. Given recent stroke we allowed permissive hypertension for 48 hours before adding the vasoactive medications. Currently does not have overt HF so does not need diuretic at this time. We arranged for outpatient follow up with cardiology. Patient will need cardiac cath when he is 4-6 weeks out from his acute CVA.  3. Type II diabetes mellitus - HgA1c this admission 9.5. Started back oral regimen (metformin, glipizide) as the patient is adament about not taking insulin ever, even during his hospitalization. Recent CBGs 148, 105, 104, 164, 129, 135 before discharge.  4. Hypertension - BPs elevated to 160s systolic on admission. After allowing for permissive hypertension 48 hours s/p ischemic stroke, his BPs improved to 140s/90s with Coreg 6.25 mg BID, lisinopril 20 mg daily.   5. Hyperlipidemia - Patient was supposed to be on Zocor per records from [place], but had not complied. Lipid panel was checked as part of stroke workup:    Component Value Date/Time   CHOL 275* 03/31/2013 0620   TRIG 202* 03/31/2013 0620   HDL 38* 03/31/2013 0620   CHOLHDL 7.2 03/31/2013 0620   VLDL 40 03/31/2013 0620   LDLCALC 197* 03/31/2013 0620  Patient was started on a statin with high potency (lipitor 80 mg daily).  6. Tobacco Abuse - Denies cough, SOB. Patient is everyday smoker. Nicotine patch and provided smoking cessation counseling and literature.   Discharge Vitals:   BP 149/108  Pulse 77  Temp(Src) 97.4 F  (36.3 C) (Oral)  Resp 18  Ht 5' 10.5" (1.791 m)  Wt 178 lb 1.6 oz (80.786 kg)  BMI 25.19 kg/m2  SpO2 98%  Discharge Labs:  Results for orders placed during the hospital encounter of 03/30/13 (from the past 24 hour(s))  GLUCOSE, CAPILLARY     Status: Abnormal   Collection Time    04/03/13  4:40 PM      Result Value Range   Glucose-Capillary 105 (*) 70 - 99 mg/dL  GLUCOSE, CAPILLARY     Status: Abnormal   Collection Time    04/03/13  9:58 PM      Result Value Range   Glucose-Capillary 104 (*) 70 - 99 mg/dL   Comment 1 Documented in Chart     Comment 2 Notify RN    GLUCOSE,  CAPILLARY     Status: Abnormal   Collection Time    04/04/13  7:04 AM      Result Value Range   Glucose-Capillary 164 (*) 70 - 99 mg/dL  GLUCOSE, CAPILLARY     Status: Abnormal   Collection Time    04/04/13 11:51 AM      Result Value Range   Glucose-Capillary 129 (*) 70 - 99 mg/dL  GLUCOSE, CAPILLARY     Status: Abnormal   Collection Time    04/04/13  4:05 PM      Result Value Range   Glucose-Capillary 135 (*) 70 - 99 mg/dL    Signed: Vivi Barrack, MD 04/04/2013, 4:28 PM   Time Spent on Discharge: 30 minutes Services Ordered on Discharge: Home health PT/OT/SLP Equipment Ordered on Discharge: None

## 2013-04-04 NOTE — Progress Notes (Signed)
Subjective: Patient seen at bedside. He is awake and alert. He feels well. He says his face feels the same, no new weakness or numbness. He had some diarrhea yesterday, but no episodes since starting imodium. We talked about the possibility of moving back to [place] where his family is; he would like to stay here but is amenable to moving back if he has to because of his health issues.  Objective: Vital signs in last 24 hours: Filed Vitals:   04/03/13 1830 04/03/13 2200 04/04/13 0200 04/04/13 0500  BP: 144/87 160/88 155/93 140/95  Pulse: 75 83 81 77  Temp: 98 F (36.7 C) 98.6 F (37 C) 98.3 F (36.8 C) 98.5 F (36.9 C)  TempSrc: Oral Oral Oral Oral  Resp: 20 20 18 20   Height:      Weight:      SpO2: 97% 98% 96% 97%   Weight change:   Intake/Output Summary (Last 24 hours) at 04/04/13 0833 Last data filed at 04/03/13 1600  Gross per 24 hour  Intake      0 ml  Output      1 ml  Net     -1 ml    General: resting in bed HEENT: PERRL, EOMI, no scleral icterus Cardiac: RRR, no rubs, murmurs or gallops Pulm: clear to auscultation bilaterally, moving normal volumes of air Abd: soft, nontender, nondistended, BS present Ext: warm and well perfused, no pedal edema Neuro: alert and oriented X3, some dysarthria, drooling, left facial droop with forehead sparing, decreased rapidly alternating movements on the left, strength normal upper and lower extremity  Lab Results: Basic Metabolic Panel:  Recent Labs Lab 03/30/13 1141  NA 138  K 4.2  CL 100  CO2 27  GLUCOSE 231*  BUN 10  CREATININE 0.75  CALCIUM 9.7   CBC:  Recent Labs Lab 03/30/13 1141  WBC 6.3  NEUTROABS 2.5  HGB 16.7  HCT 48.4  MCV 81.9  PLT 219   CBG:  Recent Labs Lab 04/02/13 1640 04/02/13 2146 04/03/13 0633 04/03/13 1217 04/03/13 1640 04/03/13 2158  GLUCAP 100* 190* 148* 148* 105* 104*   Hemoglobin A1C:  Recent Labs Lab 03/31/13 0620  HGBA1C 9.5*   Fasting Lipid  Panel:  Recent Labs Lab 03/31/13 0620  CHOL 275*  HDL 38*  LDLCALC 197*  TRIG 202*  CHOLHDL 7.2   Studies/Results: No results found. Medications: I have reviewed the patient's current medications. Scheduled Meds: . aspirin  300 mg Rectal Daily   Or  . aspirin  325 mg Oral Daily  . atorvastatin  80 mg Oral q1800  . carvedilol  6.25 mg Oral BID WC  . enoxaparin (LOVENOX) injection  40 mg Subcutaneous Q24H  . glipiZIDE  5 mg Oral QAC breakfast  . insulin aspart  0-9 Units Subcutaneous TID WC  . lisinopril  20 mg Oral Daily  . metFORMIN  850 mg Oral Q breakfast  . nicotine  14 mg Transdermal Daily   Continuous Infusions:  PRN Meds:.food thickener, loperamide Assessment/Plan:  Acute ischemic stroke - The patient is still on Dysphagia III diet with honey thick liquids. He is working with PT and they are still recommending 24-hour assistance for level of care.  - Continuing ASA, statin, beta-blocker, diabetes management.  - Will follow up with SW re:SNF placement although it may be difficult given lack of insurance, awaiting bed requests - Will also need to follow up with care management re: PCPs, neurology, cardiology in [place] - Another  option is for him to go back to [place] where he has a network of supportive family members who can aid in his rehabilitation and recovery, but patient wants to stay in [place] at least for now  Type II diabetes mellitus - Started back oral regimen (metformin, glipizide) as the patient is adament about not taking insulin ever. Recent CBGs 148, 105, 104. HgA1c this admission 9.5.  Hypertension - BPs improved to 140s/90s after Coreg increased to 6.25 mg BID per cards, continuing lisinopril 20 mg daily.  Asymptomatic LV dysfunction - New onset of systolic heart failure with EF 30-35%. Does not need diuretic at this time. Is on beta-blocker (room to increase with HR 70-80s this AM), ACE-I (not maximal dose), ASA, statin.  - Appreciate  cardiology recs - Will arrange follow up with cardiology for catheterization as out-patient in 4-6 weeks  Hyperlipidemia - Statin high potency (lipitor 80 mg daily).   Tobacco Abuse - Nicotine patch and encouraged smoking cessation.  DVT ppx - lovenox Porter daily and ASA for secondary stroke prevention.  Code Status - Full.  Dispo: Disposition is deferred at this time, awaiting improvement of current medical problems.  Anticipated discharge in approximately 1-2 day(s).   The patient does not have a current PCP (No primary provider on file.) and does need an [facility] hospital follow-up appointment after discharge.  The patient does not have transportation limitations that hinder transportation to clinic appointments.  .Services Needed at time of discharge: Y = Yes, Blank = No PT:   OT:   RN:   Equipment:   Other:     LOS: 5 days   Vivi Barrack, MD 04/04/2013, 8:33 AM

## 2013-04-11 NOTE — Discharge Summary (Signed)
I saw Jerry Graham on day of discharge and agree with the formulated plan.

## 2013-04-19 ENCOUNTER — Ambulatory Visit: Payer: Self-pay | Admitting: Neurology

## 2013-04-21 ENCOUNTER — Ambulatory Visit (INDEPENDENT_AMBULATORY_CARE_PROVIDER_SITE_OTHER): Payer: Medicaid Other | Admitting: Neurology

## 2013-04-21 ENCOUNTER — Encounter: Payer: Self-pay | Admitting: Neurology

## 2013-04-21 ENCOUNTER — Telehealth: Payer: Self-pay | Admitting: Cardiovascular Disease

## 2013-04-21 VITALS — BP 130/80 | HR 80 | Temp 97.9°F | Ht 70.5 in | Wt 170.0 lb

## 2013-04-21 DIAGNOSIS — I639 Cerebral infarction, unspecified: Secondary | ICD-10-CM

## 2013-04-21 DIAGNOSIS — R42 Dizziness and giddiness: Secondary | ICD-10-CM

## 2013-04-21 DIAGNOSIS — I6350 Cerebral infarction due to unspecified occlusion or stenosis of unspecified cerebral artery: Secondary | ICD-10-CM

## 2013-04-21 DIAGNOSIS — F482 Pseudobulbar affect: Secondary | ICD-10-CM

## 2013-04-21 NOTE — Telephone Encounter (Signed)
Patient called spoke to sister was given TEE instructions.Sister understood.Written instructions mailed to patient.

## 2013-04-21 NOTE — Telephone Encounter (Signed)
Returned call to Jerry Graham from Dr.Patel's office.She stated Dr.Patel wanted patient to have a TEE for cva and pseudobular affect.TEE scheduled with Dr.Nahser 04/27/13 at [facility] at 3:00 pm.Patient called no answer.Left message on personal voice mail to call me back for instructions and time.

## 2013-04-21 NOTE — Patient Instructions (Addendum)
1.  Continue your current medications 2.  US carotids 3.  Transesophageal echocardiogram 4.  Establish care with primary care provider for ongoing medical care [phone] 5.  Return to clinic in 3 weeks

## 2013-04-21 NOTE — Telephone Encounter (Signed)
New Prob     Calling to set up a TEE for pt. Please call.

## 2013-04-21 NOTE — Progress Notes (Signed)
[facility] Neurology Division Clinic Note - Initial Visit   Date: 04/21/2013   Jerry Graham MRN: [id number] DOB: 1951-01-08   Dear Dr Criselda Peaches:  Thank you for your kind referral of Jerry Graham for consultation of stroke. Although his history is well known to you, please allow [name] to reiterate it for the purpose of our medical record. The patient was accompanied to the clinic by daughter and wife.   History of Present Illness: Jerry Graham is a 62 y.o. year-old right-handed African American male with history of tobacco use, uncontrolled diabetes mellitus, hyperlipidemia, hypertension, CHF (EF 35%) and stroke (2013) presenting for hospital follow-up of ischemic stroke.    He was recently admitted to [facility] 9/24 - 04/04/2013 for ischemic right putamen and corona radiata stroke and smaller area of the left corona radiata.  He woke up feeling unwell and noticed that he kept leaning to the left side.  Someone at his apartment noticed that he had a left facial droop and his speech was slurred so he they called EMS.  He did not receive IVtPA because he presented out of the window.  Work-up including MRA brain, echocardiogram, and labs were done.  Vessel imaging revealed moderate stenosis of LCA (cavernous segment), small L ACA and MCA compared to right side with inferior M2 branch stenostic.  New systolic heart failure was noted (EF 35%) and plans were to have out-patient cardiac catherization.  He is currently having physical therapy, occupational therapy, and speech therapy.    Currently, he complains of problems with swallowing, talking, hearing, memory problems, and left sided weakness, but that all of symptoms are improving, except for his swallowing which remains stable.  He had a swallow evaluation and was recommended to use thicker for liquids and mechanical soft diet.  He has not been using a thickner, but denies having problems with liquids or solids.  No choking spells.  Family  has noted that he gets very emotional at times and cries very easily out of proportion to the situation and sometimes unprovoked.    Last year, he had had similar symptoms of left sided weakness and was found to have small lacunar infarct of the in right thalamus and right basal ganglia.  Symptoms resolved with a few weeks.  He reports being placed on aspirin 81mg  after that.  He moved to [place] in March 2014 from [place], where his family reside.      Past Medical History  Diagnosis Date  . Hypertension   . Diabetes mellitus without complication   . Stroke 2013, 2014  . Hyperlipidemia   . Tobacco abuse     Past Surgical History  Procedure Laterality Date  . Lung biopsy      years ago     Medications:  Current Outpatient Prescriptions on File Prior to Visit  Medication Sig Dispense Refill  . aspirin 325 MG tablet Take 1 tablet (325 mg total) by mouth daily.  30 tablet  11  . atorvastatin (LIPITOR) 80 MG tablet Take 1 tablet (80 mg total) by mouth daily at 6 PM.  30 tablet  11  . carvedilol (COREG) 6.25 MG tablet Take 1 tablet (6.25 mg total) by mouth 2 (two) times daily with a meal.  60 tablet  11  . glipiZIDE (GLUCOTROL) 5 MG tablet Take 1 tablet (5 mg total) by mouth daily.  30 tablet  11  . lisinopril (PRINIVIL,ZESTRIL) 20 MG tablet Take 1 tablet (20 mg total) by mouth daily.  30  tablet  11  . metFORMIN (GLUCOPHAGE) 850 MG tablet Take 1 tablet (850 mg total) by mouth daily.  30 tablet  11  . food thickener (THICK IT) POWD Please use as directed to make liquids honey thick  850 g  11   No current facility-administered medications on file prior to visit.    Allergies: No Known Allergies  Family History: Family History  Problem Relation Age of Onset  . Scleroderma Mother     Living, 71  . Heart disease Mother   . Diabetes Father     Living, 36  . Hypertension Father   . Transient ischemic attack Father   . Hypertension Sister   . Hyperlipidemia Sister   .  Sarcoidosis Sister     Social History: History   Social History  . Marital Status: Single    Spouse Name: N/A    Number of Children: N/A  . Years of Education: N/A   Occupational History  . Not on file.   Social History Main Topics  . Smoking status: Current Every Day Smoker -- 1.00 packs/day for 50 years    Types: Cigarettes  . Smokeless tobacco: Not on file     Comment: currently smokes 1/5 ppd  . Alcohol Use: No  . Drug Use: No  . Sexual Activity: Not on file   Other Topics Concern  . Not on file   Social History Narrative   Lives alone in [place]   Retired Music therapist   No children    Review of Systems:  CONSTITUTIONAL: No fevers, chills, night sweats, or weight loss.   EYES: No visual changes or eye pain ENT: No hearing changes.  No history of nose bleeds.   RESPIRATORY: No cough, wheezing and shortness of breath.   CARDIOVASCULAR: Negative for chest pain, and palpitations.   GI: Negative for abdominal discomfort, blood in stools or black stools.  No recent change in bowel habits.  +dysphagia GU:  No history of incontinence.   MUSCLOSKELETAL: No history of joint pain or swelling.  No myalgias.   SKIN: Negative for lesions, rash, and itching.   HEMATOLOGY/ONCOLOGY: Negative for prolonged bleeding, bruising easily, and swollen nodes..   ENDOCRINE: Negative for cold or heat intolerance, polydipsia or goiter.   PSYCH:  No depression or anxiety symptoms.   NEURO: As Above.   Vital Signs:  BP 130/80  Pulse 80  Temp(Src) 97.9 F (36.6 C)  Ht 5' 10.5" (1.791 m)  Wt 170 lb (77.111 kg)  BMI 24.04 kg/m2  Neurological Exam: NIHSS:  4 (2 facial palsy, 1 ataxia, 1 dysarthria)  MENTAL STATUS including orientation to time, place, person, recent and remote memory, attention span and concentration, language, and fund of knowledge is fair.  Speech is mildly dysarthric and > 90% intelligible. Tearful several times during the interview which was unprovoked  CRANIAL  NERVES: II:  No visual field defects.  Limited fundoscopic examination due to non-dilated exam.   III-IV-VI: Pupils equal round and reactive to light.  Normal conjugate, extra-ocular eye movements in all directions of gaze.  No nystagmus. Left ptosis.   V:  Normal facial sensation.   VII:  Left facial droop, flattening of left nasolabial fold at baseline.   VIII:  Normal hearing and vestibular function.   IX-X:  Normal palatal movement.   XI:  Normal shoulder shrug and head rotation.   XII:  Normal tongue strength and range of motion, no deviation or fasciculation.  MOTOR:  No atrophy, fasciculations or  abnormal movements.  No pronator drift.    Right Upper Extremity:    Left Upper Extremity:    Deltoid  5/5   Deltoid  5/5   Biceps  5/5   Biceps  5/5   Triceps  5/5   Triceps  5/5   Wrist extensors  5/5   Wrist extensors  5/5   Wrist flexors  5/5   Wrist flexors  5/5   Finger extensors  5/5   Finger extensors  5/5   Finger flexors  5/5   Finger flexors  5/5   Dorsal interossei  5/5   Dorsal interossei  5/5   Abductor pollicis  5/5   Abductor pollicis  5/5   Tone (Ashworth scale)  0  Tone (Ashworth scale)  0   Right Lower Extremity:    Left Lower Extremity:    Hip flexors  5/5   Hip flexors  5/5   Hip extensors  5/5   Hip extensors  5/5   Knee flexors  5/5   Knee flexors  5/5   Knee extensors  5/5   Knee extensors  5/5   Dorsiflexors  5/5   Dorsiflexors  5/5   Plantarflexors  5/5   Plantarflexors  5/5   Toe extensors  5/5   Toe extensors  5/5   Toe flexors  5/5   Toe flexors  5/5   Tone (Ashworth scale)  0  Tone (Ashworth scale)  0   MSRs:  Right                                                                 Left brachioradialis 2+  brachioradialis 2+  biceps 2+  biceps 2+  triceps 2+  triceps 2+  patellar 2+  patellar 2+  ankle jerk 0  ankle jerk 0  Hoffman no  Hoffman no  plantar response down  plantar response down   SENSORY:  Normal and symmetric perception of light  touch, pinprick, vibration, and proprioception.  Romberg's sign absent.  No neglect or extinction.  COORDINATION/GAIT: Mild dysmetria with finger tapping on the left.  Mild bradykinesia with finger tapping and heel tapping on the left. Gait narrow based and stable. Tandem and stressed gait intact.   Data: CT head 03/30/2013: No intracranial hemorrhage, mass effect or midline shift. Mild cerebral atrophy.There is small lacunar infarct measures about 5 mm in right thalamus. There is small area of decreased attenuation in right basal ganglia. Measures about 1.2 cm. This may be due to ischemia of indeterminate age. Clinical correlation is necessary. If recent ischemia suspected further correlation with brain MRI with diffusion imaging is recommended. No definite acute cortical infarction.  MRI HEAD 03/30/2013 1. Acute nonhemorrhagic infarct of the right putamen and extending superiorly to the corona radiata.  2. Much smaller focal area of acute nonhemorrhagic infarction in the left corona radiata.  3. Age advanced atrophy and diffuse white matter disease likely reflects the sequela of chronic microvascular ischemia.  4. Remote lacunar infarcts of the basal ganglia bilaterally, right greater than left.  5. Remote lacunar infarct of the left paramedian midbrain.   MRA HEAD 03/30/2013 1. 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  2. Proximal inferior M2 branch vessel stenoses bilaterally, left greater than right.  3. Moderate diffuse small vessel disease.  4. Mild narrowing of the proximal basilar artery.  5. High-grade stenosis of the distal left P2 segment.   US carotids 03/31/2013: - The vertebral arteries appear patent with antegrade flow. - Findings consistent with 1-39 percent stenosis involvingthe right internal carotid artery and the left internal carotid artery.  Echo 03/31/2013: EF 30% to  35%. Akinesis of the basal-mid anteroseptal myocardium. Akinesis of the inferior myocardium  Component     Latest Ref Rng 03/31/2013  Cholesterol     0 - 200 mg/dL 295 (H)  Triglycerides     <150 mg/dL 621 (H)  HDL     >30 mg/dL 38 (L)  Total CHOL/HDL Ratio      7.2  VLDL     0 - 40 mg/dL 40  LDL (calc)     0 - 99 mg/dL 865 (H)  Hemoglobin H8I     <5.7 % 9.5 (H)  Mean Plasma Glucose     <117 mg/dL 696 (H)     IMPRESSION: Mr. Dufrane is a 62 year old man presenting for evaluation of recent R putamen/corona radiata and L corona radiata ischemic stroke on 03/30/2013.  His residual deficits include left facial droop, dysarthria, and dysphagia.  His neurological exam is notable for left facial weakness, dysarthria, and subtle left hand dysmetria. 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  NIHSS 4 today.  Muscle strength and sensation is intact.  His imaging shows R putamen/corona radiata and L corona radiata ischemic stroke and intracranial vessel imaging shows moderate stenosis of LCA (cavernous segment), small L ACA and MCA compared to right side with inferior M2 branch stenostic.  Given that he has bilateral ischemic strokes, I would like for evaluate for cardioembolic source.  Surface echocardiogram revealed reduced EF 35% and akinetic anteroseptal myocardium, making him at risk to clots.  To better assess, I will order TEE.  Stroke risk factors:  DM (HbA1c 9.5) , HTN, HPL (LDL 197 HDL 38), tobacco use, prior stroke, AA male.  He needs aggressive risk factor control including better management of diabetes and cholesterol.      PLAN/RECOMMENDATIONS:  1.  Transesophageal echocardiogram 2.  72 hour Holter monitor 3.  Continue secondary prevention with Aspirin 325mg , lipitor 80mg  4.  Establish care with primary care provider for ongoing medical care of diabetes, HTN, HPL 5.  Cardiology referral for CHF 6.  Stressed importance of medication compliance, lifestyle modification, and tobacco  cessation 7.  Discussed trial of Neudexta for pseudobulbar affect however the patient declined at this time  8.  Return to clinic in 3 weeks  The duration of this appointment visit was 60 minutes of face-to-face time with the patient.  Greater than 50% of this time was spent in counseling, explanation of diagnosis, planning of further management, and coordination of care.   Thank you for allowing me to participate in patient's care.  If I can answer any additional questions, I would be pleased to do so.    Sincerely,    Giorgio Chabot K. Allena Katz, DO

## 2013-04-22 ENCOUNTER — Ambulatory Visit: Payer: Self-pay | Admitting: Neurology

## 2013-04-22 ENCOUNTER — Other Ambulatory Visit: Payer: Self-pay

## 2013-04-22 ENCOUNTER — Telehealth: Payer: Self-pay | Admitting: Neurology

## 2013-04-22 DIAGNOSIS — F482 Pseudobulbar affect: Secondary | ICD-10-CM

## 2013-04-22 DIAGNOSIS — I6350 Cerebral infarction due to unspecified occlusion or stenosis of unspecified cerebral artery: Secondary | ICD-10-CM

## 2013-04-22 DIAGNOSIS — R42 Dizziness and giddiness: Secondary | ICD-10-CM

## 2013-04-22 NOTE — Telephone Encounter (Signed)
Spoke with the patient. Told him to be expecting a call about the 48 hour heart monitor appointment and to be sure and keep the appointment with his cardiologist, Dr. Elease Hashimoto on November 3rd. He states he will.

## 2013-04-25 ENCOUNTER — Telehealth: Payer: Self-pay | Admitting: Neurology

## 2013-04-25 NOTE — Telephone Encounter (Signed)
Jasmine December w/ [facility] called regarding pt's order for 48hr monitor. Apparently b/c pt has not been approved for [employer] or [employer] w/ [facility], he will be considered self pay until he has true approval in hand. Pt is considered self pay and must pay out of pocket for monitor. Jasmine December suggested we order a 30 day (vs. 48hr) b/c they can request a financial hardship for the 30day monitors. Please call Jasmine December [phone]. / Oneita Kras.

## 2013-04-26 ENCOUNTER — Telehealth: Payer: Self-pay | Admitting: Cardiovascular Disease

## 2013-04-26 NOTE — Telephone Encounter (Signed)
LMTCB 04/26/13@1605pm   Spoke with patient and verified that the procedure is tomorrow, 04/27/13 @ 1330pm.  Reviewed instructions with patient, Hold Metformin/Glipizide, take ASA, no food/water after midnight except to take sips of water with meds. Patient voiced agreement with plan and stated he would tell his sister that the appointment is for tomorrow (and not today).

## 2013-04-26 NOTE — Telephone Encounter (Signed)
Spoke with Jasmine December at [employer] and she cannot schedule the Holter Monitor (the 48 hour or the 30 day) until she has more information about the patient's pending insurance issues. She is going to call Michele [place] in the Financial Aid Dept and inquire about his status. She will let me know if there is something I need to do. **Dr. Allena Katz, just a FYI...Marland KitchenMarland Kitchen

## 2013-04-26 NOTE — Telephone Encounter (Signed)
Dr. Allena Katz, please advise. Thank you.  Jan

## 2013-04-26 NOTE — Telephone Encounter (Signed)
30-day monitor will be better.  Thank you.  Elizabeth Paulsen K. Allena Katz, DO

## 2013-04-26 NOTE — Telephone Encounter (Signed)
New message     Pt showed up for TEE today only to find that he did not have an appt scheduled at the hosp for the TEE.  Sister want you to check into this and call her and let her know if he got the wrong date or what. And, she has questions about a disability form.

## 2013-04-27 ENCOUNTER — Telehealth: Payer: Self-pay | Admitting: Cardiovascular Disease

## 2013-04-27 ENCOUNTER — Encounter (HOSPITAL_COMMUNITY)
Admission: RE | Disposition: A | Discharge status: Home / Self Care | Payer: Self-pay | Source: Ambulatory Visit | Attending: Cardiovascular Disease

## 2013-04-27 ENCOUNTER — Ambulatory Visit (HOSPITAL_COMMUNITY)
Admission: RE | Admit: 2013-04-27 | Discharge: 2013-04-27 | Disposition: A | Discharge status: Home / Self Care | Payer: Medicaid Other | Source: Ambulatory Visit | Attending: Cardiovascular Disease | Admitting: Cardiovascular Disease

## 2013-04-27 DIAGNOSIS — F17200 Nicotine dependence, unspecified, uncomplicated: Secondary | ICD-10-CM | POA: Insufficient documentation

## 2013-04-27 DIAGNOSIS — Z79899 Other long term (current) drug therapy: Secondary | ICD-10-CM | POA: Insufficient documentation

## 2013-04-27 DIAGNOSIS — I10 Essential (primary) hypertension: Secondary | ICD-10-CM | POA: Insufficient documentation

## 2013-04-27 DIAGNOSIS — R29810 Facial weakness: Secondary | ICD-10-CM | POA: Insufficient documentation

## 2013-04-27 DIAGNOSIS — E119 Type 2 diabetes mellitus without complications: Secondary | ICD-10-CM | POA: Insufficient documentation

## 2013-04-27 DIAGNOSIS — I6350 Cerebral infarction due to unspecified occlusion or stenosis of unspecified cerebral artery: Secondary | ICD-10-CM | POA: Insufficient documentation

## 2013-04-27 DIAGNOSIS — R471 Dysarthria and anarthria: Secondary | ICD-10-CM | POA: Insufficient documentation

## 2013-04-27 DIAGNOSIS — I6789 Other cerebrovascular disease: Secondary | ICD-10-CM

## 2013-04-27 HISTORY — PX: TEE WITHOUT CARDIOVERSION: SHX5443

## 2013-04-27 SURGERY — ECHOCARDIOGRAM, TRANSESOPHAGEAL
Anesthesia: Moderate Sedation

## 2013-04-27 MED ORDER — MIDAZOLAM HCL 5 MG/ML IJ SOLN
INTRAMUSCULAR | Status: AC
  Filled 2013-04-27: qty 2

## 2013-04-27 MED ORDER — FENTANYL CITRATE 0.05 MG/ML IJ SOLN
INTRAMUSCULAR | Status: AC
  Filled 2013-04-27: qty 2

## 2013-04-27 MED ORDER — MIDAZOLAM HCL 10 MG/2ML IJ SOLN
INTRAMUSCULAR | Status: DC | PRN
  Administered 2013-04-27: 2 mg via INTRAVENOUS
  Administered 2013-04-27: 1 mg via INTRAVENOUS
  Administered 2013-04-27: 2 mg via INTRAVENOUS

## 2013-04-27 MED ORDER — BUTAMBEN-TETRACAINE-BENZOCAINE 2-2-14 % EX AERO
INHALATION_SPRAY | CUTANEOUS | Status: DC | PRN
  Administered 2013-04-27: 2 via TOPICAL

## 2013-04-27 MED ORDER — SODIUM CHLORIDE 0.9 % IV SOLN
INTRAVENOUS | Status: DC
  Administered 2013-04-27: 500 mL via INTRAVENOUS

## 2013-04-27 MED ORDER — FENTANYL CITRATE 0.05 MG/ML IJ SOLN
INTRAMUSCULAR | Status: DC | PRN
  Administered 2013-04-27 (×2): 25 ug via INTRAVENOUS

## 2013-04-27 NOTE — H&P (View-Only) (Signed)
  Date: 03/31/2013  Patient name: Jerry Graham  Medical record number: [id number]  Date of birth: 04/22/1951   I have seen and evaluated Jerry Graham and discussed their care with the Residency Team.   Assessment and Plan: I have seen and evaluated the patient as outlined above. I agree with the formulated Assessment and Plan as detailed in the residents' admission note, with the following changes:   1. Acute ischemic stroke, lacunar to right thalamus:  Agree with work up as noted in Dr. Doristine Locks note, MRI/MRA, TTE, CD's, FLP/A1C, SLP eval and aspirin started.  Can also start on statin given acute stroke and previous stroke as noted by patient.  PT/OT pending  2. DM 2: SSI - after speech eval can restart home medications  3. Smoking - encourage cessation  Other issues per resident note.    Inez Catalina, MD 9/25/20144:11 PM

## 2013-04-27 NOTE — Interval H&P Note (Signed)
History and Physical Interval Note:  04/27/2013 3:18 PM  Jerry Graham  has presented today for surgery, with the diagnosis of STROKE, PSEUDOBULBAR AFFECT  The various methods of treatment have been discussed with the patient and family. After consideration of risks, benefits and other options for treatment, the patient has consented to  Procedure(s): TRANSESOPHAGEAL ECHOCARDIOGRAM (TEE) (N/A) as a surgical intervention .  The patient's history has been reviewed, patient examined, no change in status, stable for surgery.  I have reviewed the patient's chart and labs.  Questions were answered to the patient's satisfaction.    I met Jerry Graham a month ago when he had a stroke.  He is now scheduled for TEE.    Elyn Aquas.

## 2013-04-27 NOTE — Telephone Encounter (Signed)
No letter has been received today. I did review the information that she wanted to give, all was unchanged.

## 2013-04-27 NOTE — Progress Notes (Signed)
Echocardiogram Echocardiogram Transesophageal has been performed.  Jerry Graham 04/27/2013, 4:00 PM

## 2013-04-27 NOTE — Telephone Encounter (Signed)
Follow up  FYI  Pt's sister Daily Crate faxed med list and letter in regards to his Ins. Status letter and emergency contacts.    Thanks!

## 2013-04-27 NOTE — CV Procedure (Signed)
    Transesophageal Echocardiogram Note  Jerry Graham [id number] 09-23-1950  Procedure: Transesophageal Echocardiogram Indications: CVA  Procedure Details Consent: Obtained Time Out: Verified patient identification, verified procedure, site/side was marked, verified correct patient position, special equipment/implants available, Radiology Safety Procedures followed,  medications/allergies/relevent history reviewed, required imaging and test results available.  Performed  Medications: Fentanyl: 50 mcg IV Versed: 5 mg IV  Left Ventrical:  Moderate-severe LV dysfunction,   Mitral Valve: normal MV  Aortic Valve: normal AV, trace AI  Tricuspid Valve: normal TV  Pulmonic Valve: normal  Left Atrium/ Left atrial appendage: normal,  No throumbus,  There is significant LA spontaneous contrast  Atrial septum: no ASD or PFO by color or bubble contrast.  Aorta: mild calcification   Complications: No apparent complications Patient did tolerate procedure well.   Vesta Mixer, [name]., MD, [facility] 04/27/2013, 3:36 PM

## 2013-04-27 NOTE — H&P (View-Only) (Signed)
Date: 03/30/2013               Patient Name:  Jerry Graham MRN: [id number]  DOB: 04/22/1951 Age / Sex: 62 y.o., male   PCP: No primary provider on file.         Medical Service: Internal Medicine Teaching Service         Attending Physician: Dr. Inez Catalina, MD    First Contact: Dr. Vivi Barrack Pager: [phone]  Second Contact: Dr. Suszanne Conners Pager: [phone]       After Hours (After 5p/  First Contact Pager: [phone]  weekends / holidays): Second Contact Pager: [phone]   Chief Complaint: "My mouth is twisted  History of Present Illness:  Jerry Graham is a 62 y.o. male PMH DM2, HTN, tobacco abuse who presents with a left facial droop.  The patient reports he woke up this morning and his "mouth was twisted". He also endorses drooling and trouble swallowing his pills. He got in the car this morning and started driving, and noted he kept drifting into the left lane. He drove back home. He denies chest pain, SOB, abdominal pain, nausea, vomiting, diarrhea, constipation, headache, blurry vision, double vision, visual field cuts, orthopnea, leg swelling. He denies focal weakness or numbness, but does state he felt clumsy on his feet this morning.  He notes the exact same symptoms happened to him last year, with a left sided facial droop and trouble swallowing. He went to the hospital and his symptoms resolved overnight. He was told he had a "mini stroke." He remembers they did a CT of the head and an echocardiogram, among other things. He was living in [place] at the time and went to [facility]. Records have been requested. He does not take a daily aspirin.   He moved to [place] 5 months ago and has not set up with a primary care doctor here. He has some family locally, a cousin. 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 He is a 1/2 PPD smoker and also uses an e-cigarette. He denies alcohol or drug use. 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   Home Meds: Current Outpatient Prescriptions  Medication Sig Dispense Refill  . glipiZIDE (GLUCOTROL) 5 MG tablet Take 5 mg by mouth daily.      Marland Kitchen lisinopril (PRINIVIL,ZESTRIL) 20 MG tablet Take 20 mg by mouth daily.      . metFORMIN (GLUCOPHAGE) 850 MG tablet Take 850 mg by mouth daily.        Allergies: Allergies as of 03/30/2013  . (No Known Allergies)   Past Medical History  Diagnosis Date  . Hypertension   . Diabetes mellitus without complication   . Stroke    History reviewed. No pertinent past surgical history. History reviewed. No pertinent family history. History   Social History  . Marital Status: Single    Spouse Name: N/A    Number of Children: N/A  . Years of Education: N/A   Occupational History  . Not on file.   Social History Main Topics  . Smoking status: Current Every Day Smoker  . Smokeless tobacco: Not on file  . Alcohol Use: No  . Drug Use: No  . Sexual Activity: Not on file   Other Topics Concern  . Not on file   Social History Narrative  . No narrative on file  Review of Systems: Pertinent items are noted in HPI.  Physical Exam: Blood pressure 175/93, pulse 86, temperature 98.7 F (37.1 C), temperature source Oral, resp. rate 18, height 5' 10.5" (1.791 m), weight 178 lb 1.6 oz (80.786 kg), SpO2 94.00%. Physical Exam  Constitutional: He is oriented to person, place, and time and well-developed, well-nourished, and in no distress.  HENT:  Head: Normocephalic and atraumatic.  Mouth/Throat: Oropharynx is clear and moist.  Eyes: Conjunctivae and EOM are normal. Pupils are equal, round, and reactive to light.  Neck: Normal range of motion. Neck supple.  Cardiovascular: Normal rate, regular rhythm and normal heart sounds.  Exam reveals no gallop and no friction rub.   No murmur heard. Pulmonary/Chest: Effort normal. No respiratory distress. He has no wheezes. He has rales (Soft  crackles at lung bases bilaterally). He exhibits no tenderness.  Abdominal: Soft. There is no tenderness.  Musculoskeletal: Normal range of motion. He exhibits no edema and no tenderness.  Neurological: He is alert and oriented to person, place, and time. He has normal reflexes. He displays facial asymmetry and abnormal speech (Dysarthria). He displays no weakness, no atrophy, no tremor, normal stance and normal reflexes. A cranial nerve deficit (Left lower facial droop with forehead sparing. Tongue deviation to left. Decreased hearing on left. ) is present. No sensory deficit. He exhibits normal muscle tone. He has an abnormal Cerebellar Exam (Impaired rapid alternating finger movements in left hand). He has a normal Finger-Nose-Finger Test and a normal Romberg Test. Gait normal. Coordination abnormal. Gait normal. GCS score is 15.  Reflex Scores:      Brachioradialis reflexes are 2+ on the right side and 2+ on the left side.      Patellar reflexes are 2+ on the right side and 2+ on the left side. Skin: Skin is warm and dry. He is not diaphoretic.  Psychiatric: Affect normal.     Lab results: Basic Metabolic Panel:  Recent Labs  69/62/95 1141  NA 138  K 4.2  CL 100  CO2 27  GLUCOSE 231*  BUN 10  CREATININE 0.75  CALCIUM 9.7   CBC:  Recent Labs  03/30/13 1141  WBC 6.3  NEUTROABS 2.5  HGB 16.7  HCT 48.4  MCV 81.9  PLT 219  No left shift.  CBG:  Recent Labs  03/30/13 1113 03/30/13 1713  GLUCAP 228* 168*   Alcohol Level:  Recent Labs  03/30/13 1141  ETH <11    Imaging results:  Ct Head Wo Contrast  03/30/2013   CLINICAL DATA:  Headache, vertigo  EXAM: CT HEAD WITHOUT CONTRAST  TECHNIQUE: Contiguous axial images were obtained from the base of the skull through the vertex without intravenous contrast.  COMPARISON:  None.  FINDINGS: No skull fracture is noted. Paranasal sinuses and mastoid air cells are unremarkable.  No intracranial hemorrhage, mass effect or  midline shift. Mild cerebral atrophy. Mild periventricular and patchy subcortical white matter decreased attenuation probable due to chronic small vessel ischemic changes. There is small lacunar infarct measures about 5 mm in right thalamus. There is small area of decreased attenuation in right basal ganglia. Measures about 1.2 cm. This may be due to ischemia of indeterminate age. Clinical correlation is necessary. If recent ischemia suspected further correlation with brain MRI with diffusion imaging is recommended. No definite acute cortical infarction. No mass lesion is noted on this unenhanced scan.  IMPRESSION: No intracranial hemorrhage, mass effect or midline shift. Mild cerebral atrophy.There is small lacunar infarct measures about  5 mm in right thalamus. There is small area of decreased attenuation in right basal ganglia. Measures about 1.2 cm. This may be due to ischemia of indeterminate age. Clinical correlation is necessary. If recent ischemia suspected further correlation with brain MRI with diffusion imaging is recommended. No definite acute cortical infarction.   Electronically Signed   By: Natasha Mead   On: 03/30/2013 13:07    Other results: EKG: No olds. NSR, rate 78. LVH. 1mm ST depression in V5,V6.  Assessment & Plan by Problem: Jerry Graham is a 62 y.o. male PMH DM2, HTN, tobacco abuse who presents with a left facial droop.  #Acute ischemic stroke - Patient with left facial droop with forehead sparing, dysarthria, hand clumsiness. Out of window for TPA. Multiple risk factors for CVA including everyday smoking, HTN, DM2, history of presumed TIA last year. CT remarkable for small lacunar infarct in right thalamus, small area of decreased attenuation in right basal ganglia. He has a history of similar symptoms that resolved. We have requested records from [facility]. - Admit to IMTS, telemetry bed - Follow up records from [facility] - MRI/MRA - ASA 325 daily (oral or  pr) - 2D echo - Carotid dopplers - Lipid panel - NPO pending SLP eval - Neuro checks q2h x12h, then q4h - NIH stroke scale per nursing shift - PT/OT  #Type II diabetes mellitus - On metformin and glipizide at home. - SSI - Checking A1C - CBG monitoring  #Hypertension - Holding home lisinopril to allow for permissive hypertension in the setting of acute stroke.  #Tobacco abuse - Denies cough, SOB. Patient is everyday smoker. Bibasilar crackles on exam. - Chest x-ray - Smoking cessation counseling  #DVT PPX - Subq heparin  Dispo: Disposition is deferred at this time, awaiting improvement of current medical problems. Anticipated discharge in approximately 1-3 day(s).   The patient does not have a current PCP (No primary provider on file.) and does need an [facility] hospital follow-up appointment after discharge.  The patient does not have transportation limitations that hinder transportation to clinic appointments.  Signed: Vivi Barrack, MD 03/30/2013, 6:31 PM

## 2013-04-28 ENCOUNTER — Telehealth: Payer: Self-pay | Admitting: Cardiovascular Disease

## 2013-04-28 ENCOUNTER — Encounter (HOSPITAL_COMMUNITY): Payer: Self-pay | Admitting: Cardiovascular Disease

## 2013-04-28 ENCOUNTER — Telehealth: Payer: Self-pay | Admitting: *Deleted

## 2013-04-28 LAB — GLUCOSE, CAPILLARY: Glucose-Capillary: 136 mg/dL — ABNORMAL HIGH (ref 70–99)

## 2013-04-28 NOTE — Telephone Encounter (Signed)
Reviewed TEE results. Reinforced daily wts/ a lot of discussion about a low sodium diet and foods to avoid. Reminded of app date and time.

## 2013-04-28 NOTE — Telephone Encounter (Signed)
New message    Sister in [place]----brother had a TEE yesterday.   Pt had a stroke and cannot tell sister stuff clearly.  Sister want feedback in regards to procedure

## 2013-04-28 NOTE — Telephone Encounter (Signed)
Please call patient's sister has some questions re: his post op care.

## 2013-04-28 NOTE — Telephone Encounter (Signed)
Spoke w/ pt's sister.   Informed her that pt has not been seen in our office and Dr. Elease Hashimoto is not in our office today. Gave her # for [place] office.

## 2013-05-03 ENCOUNTER — Encounter: Payer: Self-pay | Admitting: Neurology

## 2013-05-04 ENCOUNTER — Ambulatory Visit: Payer: Self-pay | Admitting: Neurology

## 2013-05-06 ENCOUNTER — Ambulatory Visit: Payer: Self-pay | Admitting: Neurology

## 2013-05-09 ENCOUNTER — Encounter: Payer: Self-pay | Admitting: Cardiovascular Disease

## 2013-05-10 ENCOUNTER — Ambulatory Visit (INDEPENDENT_AMBULATORY_CARE_PROVIDER_SITE_OTHER): Payer: Medicaid Other | Admitting: Neurology

## 2013-05-10 ENCOUNTER — Encounter: Payer: Self-pay | Admitting: Neurology

## 2013-05-10 VITALS — BP 140/80 | HR 100 | Temp 98.0°F | Resp 28 | Ht 69.0 in | Wt 169.1 lb

## 2013-05-10 DIAGNOSIS — I639 Cerebral infarction, unspecified: Secondary | ICD-10-CM

## 2013-05-10 DIAGNOSIS — F482 Pseudobulbar affect: Secondary | ICD-10-CM

## 2013-05-10 DIAGNOSIS — I6350 Cerebral infarction due to unspecified occlusion or stenosis of unspecified cerebral artery: Secondary | ICD-10-CM

## 2013-05-10 NOTE — Patient Instructions (Signed)
1.  Continue medications as you are 2.  Continue speech therapy 3.  Holter monitor 4.  Establish with primary care provider 5.  Life style modification discussed including tobacco cessation 6.  Return to clinic in 77-months

## 2013-05-10 NOTE — Progress Notes (Signed)
[facility] Neurology Division  Follow-up Visit   Date: 05/10/2013    Jerry Graham MRN: [id number] DOB: 1950/12/10   Interim History: Jerry Graham is a 62 y.o. year-old right-handed African American male with history of tobacco use, uncontrolled diabetes mellitus, hyperlipidemia, hypertension, CHF (EF 35%) and stroke (2013) presenting for hospital follow-up of  ischemic right putamen and corona radiata stroke and smaller area of the left corona radiata (03/30/2013). He is accompanied by his mother and sister and was last seen in the clinic on 04/21/2013.  Since his last visit, he reports speech and facial weakness has improved slightly. He denies any additional weakness of his left side, however when walking he tends to veer on that side. Swallowing has not been a problem, he denies any choking spells. He is not using thick which was recommended. He continues to do physical and speech therapy and has noticed significant improvement.  No new weakness, numbness/tingling, or recent hospitalizations. He keeps asking about stopping his medication because is concerned about long-term side effects, however I stressed the importance of medication compliance and lifestyle modification to prevent vascular disease.   History of present illness: He was recently admitted to [facility] 9/24 - 04/04/2013 for ischemic right putamen and corona radiata stroke and smaller area of the left corona radiata. He woke up feeling unwell and noticed that he kept leaning to the left side. Someone at his apartment noticed that he had a left facial droop and his speech was slurred so he they called EMS. He did not receive IVtPA because he presented out of the window. Work-up including MRA brain, echocardiogram, and labs were done. Vessel imaging revealed moderate stenosis of LCA (cavernous segment), small L ACA and MCA compared to right side with inferior M2 branch stenostic. New systolic heart failure was noted (EF  35%) and plans were to have out-patient cardiac catherization.  Family has noted that he gets very emotional at times and cries very easily out of proportion to the situation and sometimes unprovoked.   Last year, he had had similar symptoms of left sided weakness and was found to have small lacunar infarct of the in right thalamus and right basal ganglia. Symptoms resolved with a few weeks. He reports being placed on aspirin 81mg  after that.   He moved to [facility] in March 2014 from [place], where his family reside.    Medications:  Current Outpatient Prescriptions on File Prior to Visit  Medication Sig Dispense Refill  . aspirin 325 MG tablet Take 1 tablet (325 mg total) by mouth daily.  30 tablet  11  . atorvastatin (LIPITOR) 80 MG tablet Take 1 tablet (80 mg total) by mouth daily at 6 PM.  30 tablet  11  . carvedilol (COREG) 6.25 MG tablet Take 1 tablet (6.25 mg total) by mouth 2 (two) times daily with a meal.  60 tablet  11  . food thickener (THICK IT) POWD Please use as directed to make liquids honey thick  850 g  11  . glipiZIDE (GLUCOTROL) 5 MG tablet Take 1 tablet (5 mg total) by mouth daily.  30 tablet  11  . lisinopril (PRINIVIL,ZESTRIL) 20 MG tablet Take 1 tablet (20 mg total) by mouth daily.  30 tablet  11  . metFORMIN (GLUCOPHAGE) 850 MG tablet Take 1 tablet (850 mg total) by mouth daily.  30 tablet  11   No current facility-administered medications on file prior to visit.    Allergies: No Known Allergies  Review of Systems:  CONSTITUTIONAL: No fevers, chills, night sweats, or weight loss.   EYES: No visual changes or eye pain ENT: No hearing changes.  No history of nose bleeds.   RESPIRATORY: No cough, wheezing and shortness of breath.   CARDIOVASCULAR: Negative for chest pain, and palpitations.   GI: Negative for abdominal discomfort, blood in stools or black stools.  No recent change in bowel habits.   GU:  No history of incontinence.   MUSCLOSKELETAL: No  history of joint pain or swelling.  No myalgias.   SKIN: Negative for lesions, rash, and itching.   HEMATOLOGY/ONCOLOGY: Negative for prolonged bleeding, bruising easily, and swollen nodes.    ENDOCRINE: Negative for cold or heat intolerance, polydipsia or goiter.   PSYCH:  No depression or anxiety symptoms.   NEURO: As Above.   Vital Signs:  BP 140/80  Pulse 100  Temp(Src) 98 F (36.7 C) (Oral)  Resp 28  Ht 5\' 9"  (1.753 m)  Wt 169 lb 1.6 oz (76.703 kg)  BMI 24.96 kg/m2  Neurological Exam: MENTAL STATUS including orientation to time, place, person, recent and remote memory, attention span and concentration, language, and fund of knowledge is normal.  Speech is mildly dysarthric and 100% intelligible.   CRANIAL NERVES: II:  No visual field defects.  Unremarkable fundi.   III-IV-VI: Pupils equal round and reactive to light.  Normal conjugate, extra-ocular eye movements in all directions of gaze.  No nystagmus.  Left ptosis.  V: Normal facial sensation.  VII: Mild left facial droop (improved), flattening of left nasolabial fold at baseline.  VIII:  Normal hearing and vestibular function.   IX-X:  Normal palatal movement.   XI:  Normal shoulder shrug and head rotation.   XII:  Normal tongue strength and range of motion, no deviation or fasciculation.  MOTOR:  No atrophy, fasciculations or abnormal movements.  No pronator drift.  Tone is normal.    Right Upper Extremity:    Left Upper Extremity:    Deltoid  5/5   Deltoid  5/5   Biceps  5/5   Biceps  5/5   Triceps  5/5   Triceps  5/5   Wrist extensors  5/5   Wrist extensors  5/5   Wrist flexors  5/5   Wrist flexors  5/5   Finger extensors  5/5   Finger extensors  5/5   Finger flexors  5/5   Finger flexors  5/5   Dorsal interossei  5/5   Dorsal interossei  5/5   Abductor pollicis  5/5   Abductor pollicis  5/5   Tone (Ashworth scale)  0  Tone (Ashworth scale)  0   Right Lower Extremity:    Left Lower Extremity:    Hip flexors   5/5   Hip flexors  5/5   Hip extensors  5/5   Hip extensors  5/5   Knee flexors  5/5   Knee flexors  5/5   Knee extensors  5/5   Knee extensors  5/5   Dorsiflexors  5/5   Dorsiflexors  5/5   Plantarflexors  5/5   Plantarflexors  5/5   Toe extensors  5/5   Toe extensors  5/5   Toe flexors  5/5   Toe flexors  5/5   Tone (Ashworth scale)  0  Tone (Ashworth scale)  0   MSRs:  Right  Left brachioradialis 2+  brachioradialis 2+  biceps 2+  biceps 2+  triceps 2+  triceps 2+  patellar 2+  patellar 2+  ankle jerk 2+  ankle jerk 2+  Hoffman no  Hoffman no  plantar response down  plantar response down   SENSORY:  Normal and symmetric perception of light touch, pinprick, vibration, and proprioception.  Romberg's sign absent.    COORDINATION/GAIT: Normal finger-to- nose-finger and heel-to-shin.  Mild dysmetria with finger tapping on the left. Mild bradykinesia with finger tapping and heel tapping on the left. Gait narrow based and stable. Tandem and stressed gait intact.   Data:  CT head 03/30/2013:  No intracranial hemorrhage, mass effect or midline shift. Mild cerebral atrophy.There is small lacunar infarct measures about 5 mm in right thalamus. There is small area of decreased attenuation in right basal ganglia. Measures about 1.2 cm. This may be due to ischemia of indeterminate age. Clinical correlation is necessary. If recent ischemia suspected further correlation with brain MRI with diffusion imaging is recommended. No definite acute cortical infarction.   MRI HEAD 03/30/2013  1. Acute nonhemorrhagic infarct of the right putamen and extending superiorly to the corona radiata.  2. Much smaller focal area of acute nonhemorrhagic infarction in the left corona radiata.  3. Age advanced atrophy and diffuse white matter disease likely reflects the sequela of chronic microvascular ischemia.  4. Remote lacunar infarcts of the basal ganglia  bilaterally, right greater than left.  5. Remote lacunar infarct of the left paramedian midbrain.   MRA HEAD 03/30/2013  1. 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  2. Proximal inferior M2 branch vessel stenoses bilaterally, left greater than right.  3. Moderate diffuse small vessel disease.  4. Mild narrowing of the proximal basilar artery.  5. High-grade stenosis of the distal left P2 segment.   US carotids 03/31/2013:  - The vertebral arteries appear patent with antegrade flow. - Findings consistent with 1-39 percent stenosis involvingthe right internal carotid artery and the left internal carotid artery.  Echo 03/31/2013: EF 30% to 35%. Akinesis of the basal-mid anteroseptal myocardium. Akinesis of the inferior myocardium   Component Latest Ref Rng  03/31/2013   Cholesterol 0 - 200 mg/dL  161 (H)   Triglycerides <150 mg/dL  096 (H)   HDL >04 mg/dL  38 (L)   Total CHOL/HDL Ratio  7.2   VLDL 0 - 40 mg/dL  40   LDL (calc) 0 - 99 mg/dL  540 (H)   Hemoglobin J8J <5.7 %  9.5 (H)   Mean Plasma Glucose <117 mg/dL  191 (H)     TEE 47/82/9562 Left ventricle: Systolic function was moderately to severely reduced. The estimated ejection fraction was in the range of 30% to 35%. - Aortic valve: No evidence of vegetation. Trivial regurgitation. - Mitral valve: No evidence of vegetation. - Left atrium: No evidence of thrombus in the atrial cavity or appendage. There was spontaneous echo contrast ("smoke"). - Atrial septum: No defect or patent foramen ovale was identified. - Pulmonic valve: No evidence of vegetation.   IMPRESSION: 1. Ischemic stroke  - R putamen/corona radiata and L corona radiata ischemic stroke on 03/30/2013  - Residual deficits include mild left facial droop, dysarthria, and dysphagia, but these are all improving  - Stroke risk factors:  DM (HbA1c 9.5) , HTN,  HPL (LDL 197 HDL 38), tobacco use, prior stroke, AA male  -  Stroke mechanism: Suspect cardioembolic given bilateral lesions and low EF (30-35%)  - Work-up   - Intracranial vessel imaging shows moderate stenosis of LCA (cavernous segment), small L ACA and MCA compared to right side with inferior M2 branch stenostic   - Surface echo:  EF 30-35%, akinetic anteroseptal myocardium   - TEE: No embolic source identified 2. 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 Continue secondary prevention with Aspirin 325mg , lipitor 80mg , and lisinopril 20mg  daily 2. Holter monitor  3. Establish care with primary care provider for ongoing medical care of diabetes, HTN, HPL  4. Continue speech therapy 5. He needs aggressive risk factor control including better management of diabetes and cholesterol.Cardiology referral for CHF  6. Stressed importance of medication compliance, lifestyle modification, and tobacco cessation  7. Return to clinic in 2 months, or sooner as needed   The duration of this appointment visit was 30 minutes of face-to-face time with the patient.  Greater than 50% of this time was spent in counseling, explanation of diagnosis, planning of further management, and coordination of care.   Thank you for allowing me to participate in patient's care.  If I can answer any additional questions, I would be pleased to do so.    Sincerely,    Kelsei Defino K. Allena Katz, DO

## 2013-05-10 NOTE — Addendum Note (Signed)
Addended by: Fayne Mediate on: 05/10/2013 03:23 PM   Modules accepted: Orders

## 2013-05-10 NOTE — Addendum Note (Signed)
Addended by: Fayne Mediate on: 05/10/2013 02:24 PM   Modules accepted: Orders

## 2013-05-11 ENCOUNTER — Ambulatory Visit (INDEPENDENT_AMBULATORY_CARE_PROVIDER_SITE_OTHER): Payer: Medicaid Other | Admitting: Cardiovascular Disease

## 2013-05-11 ENCOUNTER — Encounter: Payer: Self-pay | Admitting: Cardiovascular Disease

## 2013-05-11 ENCOUNTER — Telehealth: Payer: Self-pay | Admitting: Neurology

## 2013-05-11 VITALS — BP 128/84 | HR 81 | Ht 70.5 in | Wt 167.8 lb

## 2013-05-11 DIAGNOSIS — I499 Cardiac arrhythmia, unspecified: Secondary | ICD-10-CM

## 2013-05-11 DIAGNOSIS — I639 Cerebral infarction, unspecified: Secondary | ICD-10-CM

## 2013-05-11 DIAGNOSIS — I6350 Cerebral infarction due to unspecified occlusion or stenosis of unspecified cerebral artery: Secondary | ICD-10-CM

## 2013-05-11 DIAGNOSIS — I509 Heart failure, unspecified: Secondary | ICD-10-CM

## 2013-05-11 DIAGNOSIS — I5022 Chronic systolic (congestive) heart failure: Secondary | ICD-10-CM

## 2013-05-11 MED ORDER — CARVEDILOL 12.5 MG PO TABS: 12.5000 mg | ORAL_TABLET | Freq: Two times a day (BID) | ORAL | Status: DC

## 2013-05-11 NOTE — Assessment & Plan Note (Signed)
Mr. Cotten has chronic systolic congestive heart failure. His ejection fraction is around 30-35%. His echocardiogram suggested that he had segmental wall motion abnormalities consistent with coronary artery disease and Dr. Gala Romney had recommended that we do a cardiac catheterization as an OP.  He is done very well on medical therapy and is now on carvedilol and lisinopril.  He does not want to do heart catheterization at this point. He feels like he's doing quite well and wants to put off for another month. In addition, he thinks that he may have Medicaid at that time.  We'll increase his carvedilol to 12.5 mg twice a day.  I suggest that we proceed with right and left heart catheterization at some point either in December or perhaps in January. Continue to gradually titrate up his medications in the meantime. We'll also get an echocardiogram in several months

## 2013-05-11 NOTE — Assessment & Plan Note (Signed)
He's had a transesophageal echo which did not reveal any thrombus and did not reveal a PFO or ASD. We'll place an event monitor on him. If the event monitor doesn't show any evidence of atrial fibrillation and he may be a candidate for an implantable loop.  I've advised him to quit smoking.

## 2013-05-11 NOTE — Progress Notes (Signed)
Jerry Graham Date of Birth  06/18/1951       [facility]    [place] [address]  [address] [place], [place]  [address]   [place], [place]  [address] [phone]     [phone]   Fax  [phone]    Fax [phone]  Problem List: 1.  Chronic systolic CHF 2.  CVA 3.  Diabetes mellitus 4. Hyperlipidemia 5. Cigarette smoker  History of Present Illness:  Mr. Vantine is seen for followup today. He recently had a stroke about one month ago. Has formed a transesophageal echo which revealed moderate to severe left ventricular dysfunction with an ejection fraction of around 30-35%. He was found to have some spontaneous contrast but no evidence of thrombus. He is atrial septum was intact it was no evidence of PFO or ASD.  He presents today for followup.  He's been on lisinopril for quite some time. Carvedilol 6.25 mg twice a day was added after the diagnosis of congestive heart failure.  He has improved his diet. He was previously eating a lot of fast foods but now is consistently staying away from fast foods.    Current Outpatient Prescriptions on File Prior to Visit  Medication Sig Dispense Refill  . aspirin 325 MG tablet Take 1 tablet (325 mg total) by mouth daily.  30 tablet  11  . atorvastatin (LIPITOR) 80 MG tablet Take 1 tablet (80 mg total) by mouth daily at 6 PM.  30 tablet  11  . carvedilol (COREG) 6.25 MG tablet Take 1 tablet (6.25 mg total) by mouth 2 (two) times daily with a meal.  60 tablet  11  . glipiZIDE (GLUCOTROL) 5 MG tablet Take 1 tablet (5 mg total) by mouth daily.  30 tablet  11  . lisinopril (PRINIVIL,ZESTRIL) 20 MG tablet Take 1 tablet (20 mg total) by mouth daily.  30 tablet  11  . metFORMIN (GLUCOPHAGE) 850 MG tablet Take 1 tablet (850 mg total) by mouth daily.  30 tablet  11   No current facility-administered medications on file prior to visit.    No Known Allergies  Past Medical History  Diagnosis  Date  . Hypertension   . Diabetes mellitus without complication   . Stroke 2013, 2014  . Hyperlipidemia   . Tobacco abuse   . CHF (congestive heart failure)     Past Surgical History  Procedure Laterality Date  . Lung biopsy      years ago  . Tee without cardioversion N/A 04/27/2013    Procedure: TRANSESOPHAGEAL ECHOCARDIOGRAM (TEE);  Surgeon: Vesta Mixer, MD;  Location: [facility] ENDOSCOPY;  Service: Cardiovascular;  Laterality: N/A;    History  Smoking status  . Current Every Day Smoker -- 1.00 packs/day for 50 years  . Types: Cigarettes  Smokeless tobacco  . Not on file    Comment: currently smokes 1/5 ppd    History  Alcohol Use No    Family History  Problem Relation Age of Onset  . Scleroderma Mother     Living, 45  . Heart disease Mother   . Diabetes Father     Living, 70  . Hypertension Father   . Transient ischemic attack Father   . Hypertension Sister   . Hyperlipidemia Sister   . Sarcoidosis Sister     Reviw of Systems:  Reviewed in the HPI.  All other systems are negative.  Physical Exam: Blood pressure 128/84, pulse 81, height  5' 10.5" (1.791 m), weight 167 lb 12 oz (76.091 kg). General: Well developed, well nourished, in no acute distress.  Head: Normocephalic, atraumatic, sclera non-icteric, mucus membranes are moist,   Neck: Supple. Carotids are 2 + without bruits. No JVD   Lungs: Clear   Heart: RR, normal S1, s2  Abdomen: Soft, non-tender, non-distended with normal bowel sounds.  Msk:  Strength and tone are normal   Extremities: No clubbing or cyanosis. No edema.  Distal pedal pulses are 2+ and equal    Neuro: CN II - XII intact.  Alert and oriented X 3.  Speech is slightly slurred.  Gait is ok.  Psych:  Normal   ECG: Nov. 5, 2014:  NSR at 81, LVH with repol changes  Assessment / Plan:

## 2013-05-11 NOTE — Patient Instructions (Signed)
PLEASE SCHEDULE TO HAVE A 30 DAY EVENT MONITOR   INCREASE COREG TO 12.5 MG TWICE DAILY; NEW RX SENT IN FOR THE 12.5 MG TABLET  PLEASE FOLLOW UP WITH DR. Elease Hashimoto IN 4-6 WEEKS

## 2013-05-11 NOTE — Telephone Encounter (Signed)
Left a message for Lyon Dumont -Melida Gimenez (sister) [phone] to return my call re: appointment at the [facility] office in [place] with a new PCP, R. Rey, FNP on 05/24/13 at 10:30; to arrive at 10:15 am.

## 2013-05-12 ENCOUNTER — Other Ambulatory Visit: Payer: Self-pay

## 2013-05-12 NOTE — Telephone Encounter (Signed)
Spoke with the patient's sister, Ms. Manson Passey. Information given re: appointment with the FNP on 11/18 at 10:30; arrive at 10:15 am; phone number also given [phone].

## 2013-05-14 DIAGNOSIS — I499 Cardiac arrhythmia, unspecified: Secondary | ICD-10-CM

## 2013-05-18 ENCOUNTER — Telehealth: Payer: Self-pay

## 2013-05-18 NOTE — Telephone Encounter (Signed)
Request from Disability Determination Services , sent to [employer] on 05/19/2013 .  

## 2013-05-24 ENCOUNTER — Ambulatory Visit: Payer: Self-pay | Admitting: Adult Health

## 2013-05-27 ENCOUNTER — Encounter: Payer: Self-pay | Admitting: Adult Health

## 2013-05-27 ENCOUNTER — Ambulatory Visit (INDEPENDENT_AMBULATORY_CARE_PROVIDER_SITE_OTHER): Payer: Medicaid Other | Admitting: Adult Health

## 2013-05-27 VITALS — BP 126/80 | HR 82 | Temp 98.4°F | Resp 14 | Ht 69.0 in | Wt 170.0 lb

## 2013-05-27 DIAGNOSIS — I10 Essential (primary) hypertension: Secondary | ICD-10-CM

## 2013-05-27 DIAGNOSIS — E785 Hyperlipidemia, unspecified: Secondary | ICD-10-CM | POA: Insufficient documentation

## 2013-05-27 DIAGNOSIS — E119 Type 2 diabetes mellitus without complications: Secondary | ICD-10-CM

## 2013-05-27 DIAGNOSIS — Z1211 Encounter for screening for malignant neoplasm of colon: Secondary | ICD-10-CM

## 2013-05-27 MED ORDER — GLIPIZIDE 5 MG PO TABS: 5.0000 mg | ORAL_TABLET | Freq: Two times a day (BID) | ORAL | Status: DC

## 2013-05-27 MED ORDER — METFORMIN HCL 500 MG PO TABS: 500.0000 mg | ORAL_TABLET | Freq: Two times a day (BID) | ORAL | Status: DC

## 2013-05-27 NOTE — Assessment & Plan Note (Addendum)
Last A1c was 9.5. Patient is not compliant with diet. Eats concentrated sweets often. Reports having cookies, pies, candy bars on daily basis. Discussed importance of managing diabetes as complications with circulation, kidneys, heart, etc can result. Discussed healthy diet consisting of lean protein, vegetables, fruits, healthy oils such as olive oil, canola oil, nuts. Currently taking metformin 850 mg daily. Increase metformin to 500 mg twice a day. Also increased glipizide 5 mg twice a day. Repeat A1c in January.

## 2013-05-27 NOTE — Assessment & Plan Note (Signed)
Elevated lipids in September. Started lipitor 80 mg q evening. Watch diet - lean protein, healthy fats, fruits, vegetables. Repeat lipids in January.

## 2013-05-27 NOTE — Progress Notes (Signed)
Subjective:    Patient ID: Jerry Graham, male    DOB: 12-26-1950, 62 y.o.   MRN: [id number]  HPI  Mr. Bontempo is a pleasant 62 yo male, presents to clinic to establish care. He recently moved to [place] 7 months ago from [place], [place] due to financial concerns. He was hospitalized in September for CVA.  He has been followed by [facility] [place] [facility] since discharge. He continues to smoke cigarettes, 1/2 ppd; states he is still trying to quit completely, has been using electronic cigarettes. He admits to consuming large amounts of sugar on a regular basis, states he eats candy, pies, and ice cream daily. He states he will try to begin cutting back on his sugar consumption. He is walking daily. Uses a quad cane for stability; although gait is stable without it.    Past Medical History  Diagnosis Date  . Hypertension   . Diabetes mellitus without complication   . Stroke 2013, 2014  . Hyperlipidemia   . Tobacco abuse   . CHF (congestive heart failure)      Past Surgical History  Procedure Laterality Date  . Lung biopsy      years ago  . Tee without cardioversion N/A 04/27/2013    Procedure: TRANSESOPHAGEAL ECHOCARDIOGRAM (TEE);  Surgeon: Vesta Mixer, MD;  Location: [facility] ENDOSCOPY;  Service: Cardiovascular;  Laterality: N/A;     Family History  Problem Relation Age of Onset  . Scleroderma Mother     Living, 107  . Heart disease Mother   . Diabetes Father     Living, 4  . Hypertension Father   . Transient ischemic attack Father   . Hypertension Sister   . Hyperlipidemia Sister      History   Social History  . Marital Status: Single    Spouse Name: N/A    Number of Children: 0  . Years of Education: 14   Occupational History  . Not on file.   Social History Main Topics  . Smoking status: Current Every Day Smoker -- 0.50 packs/day for 50 years    Types: Cigarettes  . Smokeless tobacco: Not on file     Comment: currently smokes 1/5 ppd  . Alcohol Use: No   . Drug Use: No  . Sexual Activity: Not on file   Other Topics Concern  . Not on file   Social History Narrative   Mr. Hyndman grew up in [place], [place]. He attended [employer] for 2 years but did not graduate. He is single and does not have any children. He recently moved to [place] 7 months ago. He moved to the area for financial reasons. 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 Lives alone in [place]. Retired Music therapist. He enjoys exercising. Enjoys walking. He also enjoys watching TV. He really loves to dance but has found there is no where to go dancing in [place].     Review of Systems  Constitutional: Negative for fever, activity change, fatigue and unexpected weight change.  HENT: Negative.   Eyes: Negative.   Respiratory: Negative.  Negative for cough, shortness of breath and wheezing.   Cardiovascular: Negative.  Negative for chest pain.  Gastrointestinal: Negative.  Negative for nausea, vomiting, abdominal pain, diarrhea and constipation.       30 day heart monitor in place.  Endocrine: Negative.  Negative for cold intolerance, heat intolerance, polydipsia, polyphagia and polyuria.  Genitourinary: Negative.  Negative for difficulty urinating.  Musculoskeletal:  Negative.   Skin: Negative.   Allergic/Immunologic: Negative.   Neurological: Positive for facial asymmetry. Negative for dizziness, light-headedness and headaches.       Residual mild left facial droop, reports drooling at night from left side of mouth.  Hematological: Negative.   Psychiatric/Behavioral: Negative.        Objective:   Physical Exam  Constitutional: He is oriented to person, place, and time. He appears well-developed and well-nourished. No distress.  HENT:  Head: Normocephalic and atraumatic.  Right Ear: External ear normal.  Left Ear: External ear normal.  Neck: Normal range of motion. Neck supple. No tracheal deviation present.  Cardiovascular: Normal  rate, regular rhythm and normal heart sounds.  Exam reveals no gallop.   No murmur heard. Pulmonary/Chest: Effort normal and breath sounds normal. No respiratory distress. He has no wheezes.  Abdominal: Soft. Bowel sounds are normal.  Musculoskeletal: Normal range of motion.  Neurological: He is alert and oriented to person, place, and time. He has normal strength. He displays no tremor. No cranial nerve deficit or sensory deficit. He exhibits normal muscle tone. Coordination normal.  Residual mild left facial droop from CVA, ambulates with cane; gait steady without cane.  Skin: Skin is warm and dry.  Psychiatric: He has a normal mood and affect. His behavior is normal. Judgment and thought content normal.    BP 126/80  Pulse 82  Temp(Src) 98.4 F (36.9 C) (Oral)  Ht 5\' 9"  (1.753 m)  Wt 170 lb (77.111 kg)  BMI 25.09 kg/m2  SpO2 97%      Assessment & Plan:

## 2013-05-27 NOTE — Assessment & Plan Note (Signed)
Well controlled on lisinopril, coreg. Continue to follow. Patient is also followed by Cardiology - Dr. Elease Hashimoto.

## 2013-05-27 NOTE — Assessment & Plan Note (Addendum)
Patient has never had colonoscopy. Will refer to GI for evaluation and screening colonoscopy.

## 2013-05-27 NOTE — Patient Instructions (Addendum)
  Take metformin 850 mg in the morning and 1/2 tablet with the evening meal.  Take glypizide 5 mg with breakfast and 5 mg with your evening meal.  Take your lipitor 80 mg at bedtime - for cholesterol  Please do not eat too many sweets. This will drive up your blood sugar levels.   I am referring you to GI for your screening colonoscopy. They will contact you with an appointment.  Please return for a follow up appointment with me in January to recheck your blood sugar and cholesterol.

## 2013-06-13 ENCOUNTER — Encounter: Payer: Self-pay | Admitting: Cardiovascular Disease

## 2013-06-13 ENCOUNTER — Ambulatory Visit (INDEPENDENT_AMBULATORY_CARE_PROVIDER_SITE_OTHER): Payer: Medicaid Other | Admitting: Cardiovascular Disease

## 2013-06-13 ENCOUNTER — Encounter (HOSPITAL_COMMUNITY): Payer: Self-pay | Admitting: Pharmacy Technician

## 2013-06-13 ENCOUNTER — Encounter: Payer: Self-pay | Admitting: *Deleted

## 2013-06-13 VITALS — BP 144/83 | HR 81 | Ht 70.0 in | Wt 179.0 lb

## 2013-06-13 DIAGNOSIS — R079 Chest pain, unspecified: Secondary | ICD-10-CM

## 2013-06-13 DIAGNOSIS — I509 Heart failure, unspecified: Secondary | ICD-10-CM

## 2013-06-13 DIAGNOSIS — I5022 Chronic systolic (congestive) heart failure: Secondary | ICD-10-CM

## 2013-06-13 NOTE — Assessment & Plan Note (Signed)
Mr. Sudano has made progress.  He did describe some chest discomfort/tightness during sexual intercourse recently. We had suggested a cardiac cath at his last visit and he wanted to wait.  He is now willing to have the cardiac cath.  We'll get precath labs today.  We'll set it up for Thursday, December 11.  We will increase his carvedilol to 12.5 mg twice a day. He's currently taking Coreg 12.5  mg in the morning with 6.25 mg in the evening We discussed the risks, benefits, and options concerning cardiac catheterization. He understands and agrees to proceed.

## 2013-06-13 NOTE — Patient Instructions (Addendum)
Your physician has requested that you have a cardiac catheterization. Cardiac catheterization is used to diagnose and/or treat various heart conditions. Doctors may recommend this procedure for a number of different reasons. The most common reason is to evaluate chest pain. Chest pain can be a symptom of coronary artery disease (CAD), and cardiac catheterization can show whether plaque is narrowing or blocking your heart's arteries. This procedure is also used to evaluate the valves, as well as measure the blood flow and oxygen levels in different parts of your heart. For further information please visit [email or web address] Please follow instruction sheet, as given.  Please mail back your monitor.   Your physician recommends that you schedule a follow-up appointment in: 3 months   Increase Coreg to 12.5mg  twice a day   Coronary Angiography Coronary angiography is an X-ray procedure used to look at the arteries in the heart. In this procedure, a dye (contrast dye) is injected through a long, hollow tube (catheter). The catheter is about the size of a piece of cooked spaghetti and is inserted through your groin, wrist, or arm. The dye is injected into each artery, and X-rays are then taken to show if there is a blockage in the arteries of your heart. LET [facility] CARE PROVIDER KNOW ABOUT:  Any allergies you have, including allergies to shellfish or contrast dye.   All medicines you are taking, including vitamins, herbs, eye drops, creams, and over-the-counter medicines.   Previous problems you or members of your family have had with the use of anesthetics.   Any blood disorders you have.   Previous surgeries you have had.  History of kidney problems or failure.   Other medical conditions you have. RISKS AND COMPLICATIONS  Generally, coronary angiography is a safe procedure. However, as with any procedure, complications can occur. Possible complications include:  Allergic reaction  to the dye.  Bleeding from the access site or other locations.  Kidney injury, especially in people with impaired kidney function.  Stroke (rare).  Heart attack (rare). BEFORE THE PROCEDURE   Do not eat or drink anything after midnight the night before the procedure, or as directed by your health care provider.   Ask your health care provider if it is okay to take any needed medicines with a sip of water.  PROCEDURE  You may be given a medicine to help you relax (sedative) before the procedure. This medicine is given through an intravenous (IV) access tube that is inserted into one of your veins.   The area where the catheter will be inserted is washed and shaved. This is usually done in the groin but may be done in the fold of your arm (near your elbow) or in the wrist.   A medicine will be given to numb the area where the catheter will be inserted (local anesthetic).   The health care provider will insert the catheter into an artery. The catheter is guided by using a special type of X-ray (fluoroscopy) of the blood vessel being examined.   A special dye is then injected into the catheter, and X-rays are taken. The dye helps to show where any narrowing or blockages are located in the heart arteries.  AFTER THE PROCEDURE   If the procedure is done through the leg, you will be kept in bed lying flat for several hours. You will be instructed to not bend or cross your legs.  The insertion site will be checked frequently.   The pulse in your feet or  wrist will be checked frequently.   Additional blood tests, X-rays, and an electrocardiogram may be done.   You may need to stay in the hospital overnight for observation.  Document Released: 12/28/2002 Document Revised: 02/23/2013 Document Reviewed: 11/15/2012 [facility] Patient Information 2014 [place].

## 2013-06-13 NOTE — Progress Notes (Signed)
   Jerry Graham Date of Birth  04/14/1951       [place] Office    [place] Office [address]  [address] [place], [place]  [address]   [place], [place]  [address] [phone]     [phone]   Fax  [phone]    Fax [phone]  Problem List: 1.  Chronic systolic CHF 2.  CVA 3.  Diabetes mellitus 4. Hyperlipidemia 5. Cigarette smoker  History of Present Illness:  Jerry Graham is seen for followup today. He recently had a stroke about one month ago. Has formed a transesophageal echo which revealed moderate to severe left ventricular dysfunction with an ejection fraction of around 30-35%. He was found to have some spontaneous contrast but no evidence of thrombus. He is atrial septum was intact it was no evidence of PFO or ASD.  He presents today for followup.  He's been on lisinopril for quite some time. Carvedilol 6.25 mg twice a day was added after the diagnosis of congestive heart failure.  He has improved his diet. He was previously eating a lot of fast foods but now is consistently staying away from fast foods.    Dec. 8, 2014:  Jerry Graham is doing well.  He did have some CP with sexual intercourse.   He continues to have some shortness of breath.  Current Outpatient Prescriptions on File Prior to Visit  Medication Sig Dispense Refill  . aspirin 325 MG tablet Take 1 tablet (325 mg total) by mouth daily.  30 tablet  11  . atorvastatin (LIPITOR) 80 MG tablet Take 1 tablet (80 mg total) by mouth daily at 6 PM.  30 tablet  11  . glipiZIDE (GLUCOTROL) 5 MG tablet Take 1 tablet (5 mg total) by mouth 2 (two) times daily before a meal.  60 tablet  11  . lisinopril (PRINIVIL,ZESTRIL) 20 MG tablet Take 1 tablet (20 mg total) by mouth daily.  30 tablet  11  . metFORMIN (GLUCOPHAGE) 500 MG tablet Take 1 tablet (500 mg total) by mouth 2 (two) times daily with a meal.  60 tablet  11   No current facility-administered medications on file prior  to visit.    No Known Allergies  Past Medical History  Diagnosis Date  . Hypertension   . Diabetes mellitus without complication   . Stroke 2013, 2014  . Hyperlipidemia   . Tobacco abuse   . CHF (congestive heart failure)     Past Surgical History  Procedure Laterality Date  . Lung biopsy      years ago  . Tee without cardioversion N/A 04/27/2013    Procedure: TRANSESOPHAGEAL ECHOCARDIOGRAM (TEE);  Surgeon: Philip J Nahser, MD;  Location: [facility] ENDOSCOPY;  Service: Cardiovascular;  Laterality: N/A;    History  Smoking status  . Current Every Day Smoker -- 0.50 packs/day for 50 years  . Types: Cigarettes  Smokeless tobacco  . Not on file    Comment: currently smokes 1/5 ppd    History  Alcohol Use No    Family History  Problem Relation Age of Onset  . Scleroderma Mother     Living, 89  . Heart disease Mother   . Diabetes Father     Living, 89  . Hypertension Father   . Transient ischemic attack Father   . Hypertension Sister   . Hyperlipidemia Sister     Reviw of Systems:  Reviewed in the HPI.  All other systems are negative.    Physical Exam: Blood pressure 144/83, pulse 81, height 5' 10" (1.778 m), weight 179 lb (81.194 kg). General: Well developed, well nourished, in no acute distress.  Head: Normocephalic, atraumatic, sclera non-icteric, mucus membranes are moist,   Neck: Supple. Carotids are 2 + without bruits. No JVD   Lungs: Clear   Heart: RR, normal S1, s2  Abdomen: Soft, non-tender, non-distended with normal bowel sounds.  Msk:  Strength and tone are normal   Extremities: No clubbing or cyanosis. No edema.  Distal pedal pulses are 2+ and equal    Neuro: CN II - XII intact.  Alert and oriented X 3.  Speech is slightly slurred.  Gait is ok.  Psych:  Normal   ECG: Nov. 5, 2014:  NSR at 81, LVH with repol changes  Assessment / Plan:   

## 2013-06-13 NOTE — Assessment & Plan Note (Signed)
Blood pressure remains mildly elevated. We will increase carvedilol to 12.5 mg twice a day.

## 2013-06-14 ENCOUNTER — Ambulatory Visit (INDEPENDENT_AMBULATORY_CARE_PROVIDER_SITE_OTHER): Payer: Medicaid Other

## 2013-06-14 DIAGNOSIS — I499 Cardiac arrhythmia, unspecified: Secondary | ICD-10-CM

## 2013-06-14 LAB — BASIC METABOLIC PANEL
BUN/Creatinine Ratio: 14 (ref 10–22)
CO2: 21 mmol/L (ref 18–29)
Calcium: 8.7 mg/dL (ref 8.6–10.2)
Chloride: 100 mmol/L (ref 97–108)
GFR calc Af Amer: 108 mL/min/{1.73_m2} (ref >59)
GFR calc non Af Amer: 94 mL/min/{1.73_m2} (ref >59)
Potassium: 4.7 mmol/L (ref 3.5–5.2)
Sodium: 139 mmol/L (ref 134–144)

## 2013-06-14 LAB — CBC WITH DIFFERENTIAL
Basophils Absolute: 0.1 10*3/uL (ref 0.0–0.2)
Eos: 7 %
HCT: 41.2 % (ref 37.5–51.0)
Immature Granulocytes: 0 %
Lymphocytes Absolute: 2.3 10*3/uL (ref 0.7–3.1)
MCH: 27.7 pg (ref 26.6–33.0)
MCV: 81 fL (ref 79–97)
Monocytes Absolute: 0.6 10*3/uL (ref 0.1–0.9)
Monocytes: 10 %
Neutrophils Absolute: 2.9 10*3/uL (ref 1.4–7.0)
RBC: 5.09 x10E6/uL (ref 4.14–5.80)
RDW: 14.4 % (ref 12.3–15.4)
WBC: 6.3 10*3/uL (ref 3.4–10.8)

## 2013-06-14 LAB — PROTIME-INR: Prothrombin Time: 11.5 s (ref 9.1–12.0)

## 2013-06-16 ENCOUNTER — Encounter (HOSPITAL_COMMUNITY)
Admission: RE | Disposition: A | Discharge status: Home / Self Care | Payer: Self-pay | Source: Ambulatory Visit | Attending: Cardiovascular Disease

## 2013-06-16 ENCOUNTER — Ambulatory Visit (HOSPITAL_COMMUNITY)
Admission: RE | Admit: 2013-06-16 | Discharge: 2013-06-16 | Disposition: A | Discharge status: Home / Self Care | Payer: Medicaid Other | Source: Ambulatory Visit | Attending: Cardiovascular Disease | Admitting: Cardiovascular Disease

## 2013-06-16 ENCOUNTER — Telehealth: Payer: Self-pay | Admitting: *Deleted

## 2013-06-16 DIAGNOSIS — I2510 Atherosclerotic heart disease of native coronary artery without angina pectoris: Secondary | ICD-10-CM

## 2013-06-16 DIAGNOSIS — I509 Heart failure, unspecified: Secondary | ICD-10-CM | POA: Insufficient documentation

## 2013-06-16 DIAGNOSIS — F17200 Nicotine dependence, unspecified, uncomplicated: Secondary | ICD-10-CM | POA: Insufficient documentation

## 2013-06-16 DIAGNOSIS — E119 Type 2 diabetes mellitus without complications: Secondary | ICD-10-CM | POA: Insufficient documentation

## 2013-06-16 DIAGNOSIS — Z8673 Personal history of transient ischemic attack (TIA), and cerebral infarction without residual deficits: Secondary | ICD-10-CM | POA: Insufficient documentation

## 2013-06-16 DIAGNOSIS — I5022 Chronic systolic (congestive) heart failure: Secondary | ICD-10-CM | POA: Insufficient documentation

## 2013-06-16 DIAGNOSIS — I200 Unstable angina: Secondary | ICD-10-CM | POA: Insufficient documentation

## 2013-06-16 DIAGNOSIS — E785 Hyperlipidemia, unspecified: Secondary | ICD-10-CM | POA: Insufficient documentation

## 2013-06-16 DIAGNOSIS — Z79899 Other long term (current) drug therapy: Secondary | ICD-10-CM | POA: Insufficient documentation

## 2013-06-16 DIAGNOSIS — Z7982 Long term (current) use of aspirin: Secondary | ICD-10-CM | POA: Insufficient documentation

## 2013-06-16 HISTORY — PX: LEFT HEART CATHETERIZATION WITH CORONARY ANGIOGRAM: SHX5451

## 2013-06-16 LAB — GLUCOSE, CAPILLARY
Glucose-Capillary: 222 mg/dL — ABNORMAL HIGH (ref 70–99)
Glucose-Capillary: 219 mg/dL — ABNORMAL HIGH (ref 70–99)

## 2013-06-16 SURGERY — LEFT HEART CATHETERIZATION WITH CORONARY ANGIOGRAM
Anesthesia: LOCAL

## 2013-06-16 MED ORDER — MIDAZOLAM HCL 2 MG/2ML IJ SOLN
INTRAMUSCULAR | Status: AC
  Filled 2013-06-16: qty 2

## 2013-06-16 MED ORDER — HEPARIN (PORCINE) IN NACL 2-0.9 UNIT/ML-% IJ SOLN
INTRAMUSCULAR | Status: AC
  Filled 2013-06-16: qty 1500

## 2013-06-16 MED ORDER — SODIUM CHLORIDE 0.9 % IV SOLN
INTRAVENOUS | Status: DC
  Administered 2013-06-16: 08:00:00 via INTRAVENOUS

## 2013-06-16 MED ORDER — SODIUM CHLORIDE 0.9 % IJ SOLN: 3.0000 mL | INTRAMUSCULAR | Status: DC | PRN

## 2013-06-16 MED ORDER — LIDOCAINE HCL (PF) 1 % IJ SOLN
INTRAMUSCULAR | Status: AC
  Filled 2013-06-16: qty 30

## 2013-06-16 MED ORDER — VERAPAMIL HCL 2.5 MG/ML IV SOLN
INTRAVENOUS | Status: AC
  Filled 2013-06-16: qty 2

## 2013-06-16 MED ORDER — ASPIRIN 81 MG PO CHEW
81.0000 mg | CHEWABLE_TABLET | ORAL | Status: AC
  Administered 2013-06-16: 81 mg via ORAL
  Filled 2013-06-16: qty 1

## 2013-06-16 MED ORDER — FENTANYL CITRATE 0.05 MG/ML IJ SOLN
INTRAMUSCULAR | Status: AC
  Filled 2013-06-16: qty 2

## 2013-06-16 MED ORDER — SODIUM CHLORIDE 0.9 % IJ SOLN: 3.0000 mL | Freq: Two times a day (BID) | INTRAMUSCULAR | Status: DC

## 2013-06-16 MED ORDER — HEPARIN SODIUM (PORCINE) 1000 UNIT/ML IJ SOLN
INTRAMUSCULAR | Status: AC
  Filled 2013-06-16: qty 1

## 2013-06-16 MED ORDER — SODIUM CHLORIDE 0.9 % IV SOLN: 250.0000 mL | INTRAVENOUS | Status: DC | PRN

## 2013-06-16 NOTE — CV Procedure (Signed)
     Cardiac Cath Note  Jerry Graham [id number] [birth year]  Procedure: left  Heart Cardiac Catheterization Note Indications:  CHF, unstable angina  Procedure Details Consent: Obtained Time Out: Verified patient identification, verified procedure, site/side was marked, verified correct patient position, special equipment/implants available, Radiology Safety Procedures followed,  medications/allergies/relevent history reviewed, required imaging and test results available.  Performed   Medications: Fentanyl: 50 mcg IV  Versed: 2 mg IV Verapamil 3 mg IA Heparin 4000 units IV  The right radial  artery was easily canulated using a modified Seldinger technique.  Hemodynamics:   LV pressure: 126/10/23 Aortic pressure: 123/72  Angiography   Left Main: minor luminal irreg.  Left anterior Descending:  Proximal 60-70% stenosis, mid 70-80 stenosis.  The terminal LAD is small and has a 90% stenosis.  The diags are small and have diffuse disease.   Left Circumflex: large vessel.  IM 1 is large with a 60-70% stenosis proximally .  The OM divides into a small branch and a large branch.  The large branch has a 50% stenosis .  The smaller branch is tortous and is diffusely diseased 50-75%.    Right Coronary Artery: moderate in size, dominant.  Occluded at mid point.  The distal RCA fills via left to right collaterals.  LV Gram: moderate - severe LV dysfunction  .  Akinesis of the inferior wall.  Severe hypokinesis of the anterior wall.  The apex contracts fairly well.   LVEF  Is 30-35%  Complications: No apparent complications Patient did tolerate procedure well.  Contrast used: 90 cc  Conclusions:   1. Moderate - severe 3 vessel CAD. 2. Moderate - severe LV dysfunction.  Will continue medical therapy.  Will consult TCTS for consideration of CABG.    Jerry Graham, Jerry Graham., MD, [facility] 06/16/2013, 9:50 AM Office - [phone] Pager [phone]

## 2013-06-16 NOTE — H&P (View-Only) (Signed)
Jerry Graham Date of Birth  1951/07/02       [facility]    [place] [address]  [address] [place], [place]  [address]   [place], [place]  [address] [phone]     [phone]   Fax  [phone]    Fax [phone]  Problem List: 1.  Chronic systolic CHF 2.  CVA 3.  Diabetes mellitus 4. Hyperlipidemia 5. Cigarette smoker  History of Present Illness:  Jerry Graham is seen for followup today. He recently had a stroke about one month ago. Has formed a transesophageal echo which revealed moderate to severe left ventricular dysfunction with an ejection fraction of around 30-35%. He was found to have some spontaneous contrast but no evidence of thrombus. He is atrial septum was intact it was no evidence of PFO or ASD.  He presents today for followup.  He's been on lisinopril for quite some time. Carvedilol 6.25 mg twice a day was added after the diagnosis of congestive heart failure.  He has improved his diet. He was previously eating a lot of fast foods but now is consistently staying away from fast foods.    Dec. 8, 2014:  Jerry Graham is doing well.  He did have some CP with sexual intercourse.   He continues to have some shortness of breath.  Current Outpatient Prescriptions on File Prior to Visit  Medication Sig Dispense Refill  . aspirin 325 MG tablet Take 1 tablet (325 mg total) by mouth daily.  30 tablet  11  . atorvastatin (LIPITOR) 80 MG tablet Take 1 tablet (80 mg total) by mouth daily at 6 PM.  30 tablet  11  . glipiZIDE (GLUCOTROL) 5 MG tablet Take 1 tablet (5 mg total) by mouth 2 (two) times daily before a meal.  60 tablet  11  . lisinopril (PRINIVIL,ZESTRIL) 20 MG tablet Take 1 tablet (20 mg total) by mouth daily.  30 tablet  11  . metFORMIN (GLUCOPHAGE) 500 MG tablet Take 1 tablet (500 mg total) by mouth 2 (two) times daily with a meal.  60 tablet  11   No current facility-administered medications on file prior  to visit.    No Known Allergies  Past Medical History  Diagnosis Date  . Hypertension   . Diabetes mellitus without complication   . Stroke 2013, 2014  . Hyperlipidemia   . Tobacco abuse   . CHF (congestive heart failure)     Past Surgical History  Procedure Laterality Date  . Lung biopsy      years ago  . Tee without cardioversion N/A 04/27/2013    Procedure: TRANSESOPHAGEAL ECHOCARDIOGRAM (TEE);  Surgeon: Vesta Mixer, MD;  Location[facility] ENDOSCOPY;  Service: Cardiovascular;  Laterality: N/A;    History  Smoking status  . Current Every Day Smoker -- 0.50 packs/day for 50 years  . Types: Cigarettes  Smokeless tobacco  . Not on file    Comment: currently smokes 1/5 ppd    History  Alcohol Use No    Family History  Problem Relation Age of Onset  . Scleroderma Mother     Living, 28  . Heart disease Mother   . Diabetes Father     Living, 19  . Hypertension Father   . Transient ischemic attack Father   . Hypertension Sister   . Hyperlipidemia Sister     Reviw of Systems:  Reviewed in the HPI.  All other systems are negative.  Physical Exam: Blood pressure 144/83, pulse 81, height 5\' 10"  (1.778 m), weight 179 lb (81.194 kg). General: Well developed, well nourished, in no acute distress.  Head: Normocephalic, atraumatic, sclera non-icteric, mucus membranes are moist,   Neck: Supple. Carotids are 2 + without bruits. No JVD   Lungs: Clear   Heart: RR, normal S1, s2  Abdomen: Soft, non-tender, non-distended with normal bowel sounds.  Msk:  Strength and tone are normal   Extremities: No clubbing or cyanosis. No edema.  Distal pedal pulses are 2+ and equal    Neuro: CN II - XII intact.  Alert and oriented X 3.  Speech is slightly slurred.  Gait is ok.  Psych:  Normal   ECG: Nov. 5, 2014:  NSR at 81, LVH with repol changes  Assessment / Plan:

## 2013-06-16 NOTE — Interval H&P Note (Signed)
History and Physical Interval Note:  06/16/2013 9:06 AM  Jerry Graham  has presented today for surgery, with the diagnosis of Chest pain/CHF  The various methods of treatment have been discussed with the patient and family. After consideration of risks, benefits and other options for treatment, the patient has consented to  Procedure(s): LEFT HEART CATHETERIZATION WITH CORONARY ANGIOGRAM (N/A) as a surgical intervention .  The patient's history has been reviewed, patient examined, no change in status, stable for surgery.  I have reviewed the patient's chart and labs.  Questions were answered to the patient's satisfaction.     Cath Lab Visit (complete for each Cath Lab visit)  Clinical Evaluation Leading to the Procedure:   ACS: no  Non-ACS:    Anginal Classification: CCS I  Anti-ischemic medical therapy: Minimal Therapy (1 class of medications)  Non-Invasive Test Results: No non-invasive testing performed  Prior CABG: No previous CABG  He has severe LV dysfunction - EF 30-35%.          Elyn Aquas.

## 2013-06-16 NOTE — Progress Notes (Signed)
DR VAN TRIGT CALLED AND STATES WILL SEE CLIENT IN OFFICE

## 2013-06-16 NOTE — Telephone Encounter (Signed)
This patient had a cath today and his sister could not be with him. She called and said the family member that was with the patient could not remember what to tell the patients sisters about the results. Meriam Sprague or Rinaldo Cloud (the patients sisters) would like Dr. Elease Hashimoto to call them so they can ask a few questions. They can be reached at [phone]. Patient has approved [name] to share results with his sisters.

## 2013-06-17 ENCOUNTER — Telehealth: Payer: Self-pay | Admitting: *Deleted

## 2013-06-17 ENCOUNTER — Institutional Professional Consult (permissible substitution) (INDEPENDENT_AMBULATORY_CARE_PROVIDER_SITE_OTHER): Payer: Medicaid Other | Admitting: Cardiothoracic Surgery

## 2013-06-17 VITALS — BP 148/90 | HR 88 | Resp 16 | Ht 70.0 in | Wt 179.0 lb

## 2013-06-17 DIAGNOSIS — I2510 Atherosclerotic heart disease of native coronary artery without angina pectoris: Secondary | ICD-10-CM

## 2013-06-17 NOTE — Telephone Encounter (Signed)
Spoke with patient and sister. Informed them that patient has appointment today at 1pm with Dr. Donata Clay. Patient was not sure he could make the appointment. I expressed the importance of being at the appointment today. Patients sister said she lives out of town but would try to arrange transportation. Patients sister asked the for the [facility] phone number because she had questions for the nurse who discharged her brother yesterday. Patient and sister also given phone number and address for Dr. Donata Clay office.

## 2013-06-17 NOTE — Progress Notes (Signed)
Patient ID: Jerry Graham, male   DOB: 1951/05/02, 62 y.o.   MRN: [id number]      [address]       [place] [address]             [phone]        Erion Weightman [facility] Medical Record #[id number] Date of Birth: 12-18-1950  Referring: Nahser, Deloris Ping, MD Primary Care: Rey,Raquel, NP  Chief Complaint:    Chief Complaint  Patient presents with  . Coronary Artery Disease    eval for CABG..cathed 06/16/13.Marland KitchenMarland KitchenTEE 04/27/13   Patient examined, cardiac catheterization and echocardiogram reviewed   History of Present Illness:     62 year old male diabetic smoker  presents for evaluation to treat recently diagnosed severe three-vessel CAD with moderate LV dysfunction. The patient sustained a right thalamic CVA October 1 of this year. He did not receive lytic therapy due to delayed presentation. A 2-D echocardiogram was performed which showed EF of 35%, no cardiac source of thromboembolism noted. He subsequently underwent cardiac catheterization which demonstrated severe three-vessel CAD-90% proximal LAD, total occlusion of a codominant RCA, 80% stenosis of the circumflex marginal. LVEDP was 22. Right heart catheterization was not performed. Ejection fraction was 35%.  Patient denies prior history of cardiac or vascular disease. He still has left facial drooping and speech problems but denies any swallowing difficulty or aspiration symptoms. He has been recommended to wait 3 months after his CVA before he has heart surgery. He denies angina symptoms of CHF  The patient continues to actively smoke one half to one pack per day. He does not measure his home blood sugars. 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 Hypertension   . Diabetes mellitus without complication   . Stroke 2013, 2014  . Hyperlipidemia   . Tobacco abuse   . CHF (congestive heart failure)     Past Surgical History  Procedure Laterality Date  . Lung biopsy      years ago  . Tee without cardioversion N/A 04/27/2013    Procedure: TRANSESOPHAGEAL ECHOCARDIOGRAM (TEE);  Surgeon: Vesta Mixer, MD;  Location: [facility] ENDOSCOPY;  Service: Cardiovascular;  Laterality: N/A;    History  Smoking status  . Current Every Day Smoker -- 0.50 packs/day for 50 years  . Types: Cigarettes  Smokeless tobacco  . Not on file    Comment: currently smokes 1/5 ppd   History  Alcohol Use No    History   Social History  . Marital Status: Single    Spouse Name: N/A    Number of Children: 0  . Years of Education: 14   Occupational History  . Not on file.   Social History Main Topics  . Smoking status: Current Every Day Smoker -- 0.50 packs/day for 50 years    Types: Cigarettes  . Smokeless tobacco: Not on file     Comment: currently smokes 1/5  ppd  . Alcohol Use: No  . Drug Use: No  . Sexual Activity: Not on file   Other Topics Concern  . Not on file   Social History Narrative   Jerry Graham grew up in [place], [place]. He attended [employer] for 2 years but did not graduate. He is single and does not have any children. He recently moved to [place] 7 months ago. He moved to the area for financial reasons. 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 Lives alone in [place]. Retired Music therapist. He enjoys exercising. Enjoys walking. He also enjoys watching TV. He really loves to dance but  has found there is no where to go dancing in [place].    No Known Allergies  Current Outpatient Prescriptions  Medication Sig Dispense Refill  . aspirin 325 MG tablet Take 1 tablet (325 mg total) by mouth daily.  30 tablet  11  . atorvastatin (LIPITOR) 80 MG tablet Take 1 tablet (80 mg total) by mouth daily at 6 PM.  30 tablet  11  . carvedilol (COREG) 12.5 MG tablet Take 6.25 mg by mouth 2 (two) times daily with a meal.       . glipiZIDE (GLUCOTROL) 5 MG tablet Take 1 tablet (5 mg total) by mouth 2 (two) times daily before a meal.  60 tablet  11  . lisinopril (PRINIVIL,ZESTRIL) 20 MG tablet Take 1 tablet (20 mg total) by mouth daily.  30 tablet  11  . metFORMIN (GLUCOPHAGE) 500 MG tablet Take 1 tablet (500 mg total) by mouth 2 (two) times daily with a meal.  60 tablet  11   No current facility-administered medications for this visit.     (Not in a hospital admission)  Family History  Problem Relation Age of Onset  . Scleroderma Mother     Living, 80  . Heart disease Mother   . Diabetes Father     Living, 71  . Hypertension Father   . Transient ischemic attack Father   . Hypertension Sister   . Hyperlipidemia Sister      Review of Systems:   He is maintaining sinus rhythm TEE shows no significant MR He is right-hand dominant He denies any previous thoracic injuries or trauma or pneumothorax He denies any bleeding diathesis He denies-of symptoms of claudication    Cardiac Review of Systems: Y or N  Chest Pain [ N.   ]  Resting SOB [N.   ] Exertional SOB  [  ]  Orthopnea [ n  ]   Pedal Edema [ N.  ]    Palpitations [ ]  Syncope  [  ]   Presyncope [  n ]  General Review of Systems: [Y] = yes [  ]=no Constitional: recent weight change [  ]; anorexia [  ]; fatigue [  ]; nausea [  ]; night sweats [  ]; fever [  ]; or chills [  ]                                                               Dental: poor dentition[  ]; Last Dentist visit: 1 year  Eye : blurred vision [  ];  diplopia [   ]; vision changes [  ];  Amaurosis fugax[  ]; Resp: cough [  ];  wheezing[  ];  hemoptysis[  ]; shortness of breath[  ]; paroxysmal nocturnal dyspnea[  ]; dyspnea on exertion[  ]; or orthopnea[  ];  GI:  gallstones[  ], vomiting[  ];  dysphagia[  ]; melena[  ];  hematochezia [  ]; heartburn[  ];   Hx of  Colonoscopy[  ]; GU: kidney stones [  ]; hematuria[  ];   dysuria [  ];  nocturia[  ];  history of     obstruction [  ]; urinary frequency [  ]             Skin: rash, swelling[  ];, hair loss[  ];  peripheral edema[  ];  or itching[  ]; Musculosketetal: myalgias[  ];  joint swelling[  ];  joint erythema[  ];  joint pain[  ];  back pain[  ];  Heme/Lymph: bruising[  ];  bleeding[  ];  anemia[  ];  Neuro: TIA[  ];  headaches[  ];  stroke[Y.  ];  vertigo[  ];  seizures[  ];   paresthesias[  ];  difficulty walking[  ];  Psych:depression[  ]; anxiety[  ];  Endocrine: diabetes[ Y. ];  thyroid dysfunction[  ];  Immunizations: Flu [  ]; Pneumococcal[  ];  Other:  Physical Exam: BP 148/90  Pulse 88  Resp 16  Ht 5\' 10"  (1.778 m)  Wt 179 lb (81.194 kg)  BMI 25.68 kg/m2  SpO2 97%  General appearance-middle-aged Afro-American male no acute distress slight left facial droop and slight speech impairment HEENT-normocephalic, pupils equal, left facial droop, dentition good Neck-no bruit JVD mass or adenopathy Thorax-note deformity breath sounds clear and equal bilaterally Cardiac-no murmur rub or gallop regular rhythm Abdomen-obese soft nontender without pulsatile mass Extremities-positive clubbing and the nailbed negative cyanosis edema or tenderness Vascular-palpable pulses in extremities, no significant evidence of venous insufficiency of the lower extremities Neurologic-mild speech impairment, normal gait no focal extremity weakness   Diagnostic Studies & Laboratory data:   Coronary angiogram, echocardiogram reviewed  Recent Radiology Findings:   No results found.  Last chest  x-ray shows changes of COPD  Recent Lab Findings: Lab Results  Component Value Date   WBC 6.3 06/13/2013   HGB 14.1 06/13/2013   HCT 41.2 06/13/2013   PLT 245 06/13/2013   GLUCOSE 277* 06/13/2013   CHOL 275* 03/31/2013   TRIG 202* 03/31/2013   HDL 38* 03/31/2013   LDLCALC 197* 03/31/2013   NA 139 06/13/2013   K 4.7 06/13/2013   CL 100 06/13/2013   CREATININE 0.83 06/13/2013   BUN 12 06/13/2013   CO2 21 06/13/2013   INR 1.1 06/13/2013   HGBA1C 9.5* 03/31/2013      Assessment / Plan:     #1 severe three-vessel CAD #2 moderate severe LV dysfunction #3 right thalamic CVA 04/06/2013 #4 poorly controlled diabetes mellitus #5 active smoking   The patient would benefit from multivessel CABG due to his diabetic three-vessel CAD and reduced LV function. Prior to surgery we'll need to wait 3 months for the brain to recover from CVA. He'll also need to stop smoking.  Will plan on seeing the patient back in office in late January to review smoking status, neurologic status, and to probably schedule CABG.    @ME1 @ 06/17/2013 2:20 PM

## 2013-07-18 ENCOUNTER — Other Ambulatory Visit: Payer: Self-pay | Admitting: *Deleted

## 2013-07-18 DIAGNOSIS — I2510 Atherosclerotic heart disease of native coronary artery without angina pectoris: Secondary | ICD-10-CM

## 2013-07-20 ENCOUNTER — Other Ambulatory Visit: Payer: Self-pay

## 2013-07-20 ENCOUNTER — Encounter: Payer: Self-pay | Admitting: Cardiothoracic Surgery

## 2013-07-20 ENCOUNTER — Ambulatory Visit (INDEPENDENT_AMBULATORY_CARE_PROVIDER_SITE_OTHER): Payer: Medicaid Other | Admitting: Cardiothoracic Surgery

## 2013-07-20 ENCOUNTER — Ambulatory Visit
Admission: RE | Admit: 2013-07-20 | Discharge: 2013-07-20 | Disposition: A | Discharge status: Home / Self Care | Payer: Medicaid Other | Source: Ambulatory Visit | Attending: Cardiothoracic Surgery | Admitting: Cardiothoracic Surgery

## 2013-07-20 VITALS — BP 144/90 | HR 84 | Resp 16 | Ht 70.0 in | Wt 179.0 lb

## 2013-07-20 DIAGNOSIS — I2510 Atherosclerotic heart disease of native coronary artery without angina pectoris: Secondary | ICD-10-CM

## 2013-07-20 NOTE — Progress Notes (Signed)
PCP is Rey,Raquel, NP Referring Provider is Nahser, Deloris Ping, MD  Chief Complaint  Patient presents with  . Coronary Artery Disease    Further discuss surgery, possible CABG     HPI: Patient with known multivessel CAD with LV dysfunction. He initially presented approximately 2 half months ago with a stroke. This left him with left-sided weakness and facial droop. He was a right thigh Lamictal box stroke. Echocardiogram was negative for embolic source but shows severe LV dysfunction so he underwent cardiac catheterization which showed moderate to severe coronary disease with chronically occluded right and inferior wall hypokinesia. Moderate circumflex and LAD disease. His echocardiogram did not indicate significant mitral valve or aortic valve disease.  Since I saw the patient in December he has cut his smoking down for one pack a day to one half pack a day. He still is having neurologic symptoms and stumbles when he walks. He complains of insomnia and decreased exercise tolerance and some spastic problems with his left forearm. 4 Shiley is not contracted influenza this winter.  Chest x-ray today shows COPD without signs of CHF  Past Medical History  Diagnosis Date  . Hypertension   . Diabetes mellitus without complication   . Stroke 2013, 2014  . Hyperlipidemia   . Tobacco abuse   . CHF (congestive heart failure)     Past Surgical History  Procedure Laterality Date  . Lung biopsy      years ago  . Tee without cardioversion N/A 04/27/2013    Procedure: TRANSESOPHAGEAL ECHOCARDIOGRAM (TEE);  Surgeon: Vesta Mixer, MD;  Location: [facility] ENDOSCOPY;  Service: Cardiovascular;  Laterality: N/A;    Family History  Problem Relation Age of Onset  . Scleroderma Mother     Living, 84  . Heart disease Mother   . Diabetes Father     Living, 11  . Hypertension Father   . Transient ischemic attack Father   . Hypertension Sister   . Hyperlipidemia Sister     Social History History   Substance Use Topics  . Smoking status: Current Every Day Smoker -- 0.50 packs/day for 50 years    Types: Cigarettes  . Smokeless tobacco: Not on file     Comment: currently smokes 1/5 ppd  . Alcohol Use: No    Current Outpatient Prescriptions  Medication Sig Dispense Refill  . aspirin 325 MG tablet Take 1 tablet (325 mg total) by mouth daily.  30 tablet  11  . atorvastatin (LIPITOR) 80 MG tablet Take 1 tablet (80 mg total) by mouth daily at 6 PM.  30 tablet  11  . carvedilol (COREG) 12.5 MG tablet Take 6.25 mg by mouth 2 (two) times daily with a meal.       . glipiZIDE (GLUCOTROL) 5 MG tablet Take 1 tablet (5 mg total) by mouth 2 (two) times daily before a meal.  60 tablet  11  . lisinopril (PRINIVIL,ZESTRIL) 20 MG tablet Take 1 tablet (20 mg total) by mouth daily.  30 tablet  11  . metFORMIN (GLUCOPHAGE) 500 MG tablet Take 1 tablet (500 mg total) by mouth 2 (two) times daily with a meal.  60 tablet  11   No current facility-administered medications for this visit.    No Known Allergies  Review of Systems not complaining of angina mainly neurologic symptoms from his stroke  BP 144/90  Pulse 84  Resp 16  Ht 5\' 10"  (1.778 m)  Wt 179 lb (81.194 kg)  BMI 25.68 kg/m2  SpO2  94% Physical Exam Alert and comfortable Lungs with distant but clear breath sounds Heart rhythm regular without murmur Extremities without edema  Diagnostic Tests: Chest x-ray today with COPD no active disease no CHF  Impression: Significant coronary disease with reduced LV function-that is his main indication for CABG. A like to see the patient completely stop smoking as I feel his main potential risks are pulmonary as well as extension of his previous stroke. Note also like more time to pass from his initial stroke before he undergoes elective CABG. I'll see him back in a month. To review the situation  Plan: Return in one month

## 2013-07-28 ENCOUNTER — Ambulatory Visit (INDEPENDENT_AMBULATORY_CARE_PROVIDER_SITE_OTHER): Payer: Medicaid Other | Admitting: Adult Health

## 2013-07-28 ENCOUNTER — Encounter: Payer: Self-pay | Admitting: Adult Health

## 2013-07-28 VITALS — BP 112/70 | HR 75 | Resp 12 | Wt 175.5 lb

## 2013-07-28 DIAGNOSIS — E119 Type 2 diabetes mellitus without complications: Secondary | ICD-10-CM

## 2013-07-28 DIAGNOSIS — F17200 Nicotine dependence, unspecified, uncomplicated: Secondary | ICD-10-CM

## 2013-07-28 DIAGNOSIS — E785 Hyperlipidemia, unspecified: Secondary | ICD-10-CM

## 2013-07-28 DIAGNOSIS — I10 Essential (primary) hypertension: Secondary | ICD-10-CM

## 2013-07-28 DIAGNOSIS — Z716 Tobacco abuse counseling: Secondary | ICD-10-CM

## 2013-07-28 DIAGNOSIS — Z7189 Other specified counseling: Secondary | ICD-10-CM

## 2013-07-28 LAB — HEMOGLOBIN A1C: Hgb A1c MFr Bld: 8.5 % — ABNORMAL HIGH (ref 4.6–6.5)

## 2013-07-28 MED ORDER — GLIPIZIDE 5 MG PO TABS: 5.0000 mg | ORAL_TABLET | Freq: Every day | ORAL | Status: DC

## 2013-07-28 NOTE — Progress Notes (Signed)
Pre visit review using our clinic review tool, if applicable. No additional management support is needed unless otherwise documented below in the visit note. 

## 2013-07-28 NOTE — Assessment & Plan Note (Addendum)
Smoking ~ 1/2 ppd. States that he does not smoke the cigarettes completely. Discussed importance of smoking cessation. Recommend that he decrease the amount that he smokes on a daily basis until 0. Continue to follow and encourage stopping.

## 2013-07-28 NOTE — Assessment & Plan Note (Signed)
Reviewed medications. Needs to take glipizide twice daily with meals. Continue metformin bid. Check HgbA1c. Provided pt with new glucometer. RN reviewed use with pt. Continue to follow.

## 2013-07-28 NOTE — Patient Instructions (Signed)
  Please have your labs drawn before leaving.  Return to have your cholesterol checked. You cannot eat or drink anything except water prior to having this blood work. Make an early appointment. Do not take your diabetes medication.  Continue to exercise - walking is a great exercise.  Remember to take you Glipizide (for diabetes) twice a day with a meal. You were only taking it once.  Return for follow up to see me in 3 months or sooner if necessary.  Please, please, please stop smoking. You are doing very well and the smoking is hurting you.

## 2013-07-28 NOTE — Assessment & Plan Note (Signed)
Well controlled on medication. Followed by Dr. Elease HashimotoNahser

## 2013-07-28 NOTE — Assessment & Plan Note (Signed)
Check lipids. Advised that he needs to be fasting for accurate triglycerides.

## 2013-07-28 NOTE — Progress Notes (Signed)
   Subjective:    Patient ID: Jerry Graham, male    DOB: 1950/11/27, 63 y.o.   MRN: [id number]  HPI  Pt is a pleasant 63 y/o male with hx of DM, CVA, HTN, HLD, ongoing tobacco abuse who presents to clinic for follow up DM, HLD. He reports he is only taking glypizide 1 tab daily. He is supposed to be taking 2 daily. He is watching his diet. Last time we spoke he admitted to eating sweets all day long. He no longer does this. He takes his medications as prescribed except for the glipizide (but this was a misunderstanding on his behalf). He is exercising. He walks daily and he lifts light weights. He is smoking 1/2 ppd. He reports that he does not smoke the whole cigarette. He usually just takes a few drags and puts it down.    Past Medical History  Diagnosis Date  . Hypertension   . Diabetes mellitus without complication   . Stroke 2013, 2014  . Hyperlipidemia   . Tobacco abuse   . CHF (congestive heart failure)      Current Outpatient Prescriptions on File Prior to Visit  Medication Sig Dispense Refill  . aspirin 325 MG tablet Take 1 tablet (325 mg total) by mouth daily.  30 tablet  11  . atorvastatin (LIPITOR) 80 MG tablet Take 1 tablet (80 mg total) by mouth daily at 6 PM.  30 tablet  11  . carvedilol (COREG) 12.5 MG tablet Take 6.25 mg by mouth 2 (two) times daily with a meal.       . lisinopril (PRINIVIL,ZESTRIL) 20 MG tablet Take 1 tablet (20 mg total) by mouth daily.  30 tablet  11  . metFORMIN (GLUCOPHAGE) 500 MG tablet Take 1 tablet (500 mg total) by mouth 2 (two) times daily with a meal.  60 tablet  11   No current facility-administered medications on file prior to visit.     Review of Systems  Constitutional: Negative.   HENT: Negative.   Respiratory: Negative.   Cardiovascular: Negative.   Gastrointestinal: Negative.   Genitourinary: Negative.   Musculoskeletal: Negative.   Skin: Negative.   Neurological: Negative.  Negative for facial asymmetry, speech  difficulty, weakness, numbness and headaches.  Psychiatric/Behavioral: Negative.        Objective:   Physical Exam  Constitutional: He is oriented to person, place, and time. He appears well-developed and well-nourished. No distress.  HENT:  Head: Normocephalic and atraumatic.  Eyes: Conjunctivae and EOM are normal. Pupils are equal, round, and reactive to light.  Neck: Normal range of motion.  Cardiovascular: Normal rate, regular rhythm, normal heart sounds and intact distal pulses.  Exam reveals no gallop and no friction rub.   No murmur heard. Pulmonary/Chest: Effort normal and breath sounds normal. No respiratory distress. He has no wheezes. He has no rales.  Abdominal: Soft. Bowel sounds are normal.  Musculoskeletal: Normal range of motion.  Ambulating without cane. Steady gait.  Lymphadenopathy:    He has no cervical adenopathy.  Neurological: He is alert and oriented to person, place, and time. He displays normal reflexes. He exhibits normal muscle tone. Coordination normal.  Skin: Skin is warm and dry.  Psychiatric: He has a normal mood and affect. His behavior is normal. Judgment and thought content normal.    BP 112/70  Pulse 75  Resp 12  Wt 175 lb 8 oz (79.606 kg)  SpO2 97%      Assessment & Plan:

## 2013-07-29 ENCOUNTER — Telehealth: Payer: Self-pay

## 2013-07-29 NOTE — Telephone Encounter (Signed)
Relevant patient education assigned to patient using Emmi. ° °

## 2013-07-31 ENCOUNTER — Encounter: Payer: Self-pay | Admitting: Adult Health

## 2013-08-01 DIAGNOSIS — Z0271 Encounter for disability determination: Secondary | ICD-10-CM

## 2013-08-02 NOTE — Telephone Encounter (Signed)
Mailed unread message to pt  

## 2013-08-09 ENCOUNTER — Telehealth: Payer: Self-pay | Admitting: Adult Health

## 2013-08-09 NOTE — Telephone Encounter (Signed)
Relevant patient education assigned to patient using Emmi. ° °

## 2013-08-10 ENCOUNTER — Telehealth: Payer: Self-pay | Admitting: Adult Health

## 2013-08-10 NOTE — Telephone Encounter (Signed)
Relevant patient education assigned to patient using Emmi. ° °

## 2013-08-17 ENCOUNTER — Ambulatory Visit (HOSPITAL_COMMUNITY)
Admission: RE | Admit: 2013-08-17 | Discharge: 2013-08-17 | Disposition: A | Discharge status: Home / Self Care | Payer: Medicaid Other | Source: Ambulatory Visit | Attending: Cardiothoracic Surgery | Admitting: Cardiothoracic Surgery

## 2013-08-17 ENCOUNTER — Encounter: Payer: Self-pay | Admitting: Cardiothoracic Surgery

## 2013-08-17 ENCOUNTER — Ambulatory Visit (INDEPENDENT_AMBULATORY_CARE_PROVIDER_SITE_OTHER): Payer: Medicaid Other | Admitting: Cardiothoracic Surgery

## 2013-08-17 ENCOUNTER — Other Ambulatory Visit: Payer: Self-pay | Admitting: *Deleted

## 2013-08-17 VITALS — BP 150/96 | HR 90 | Resp 20 | Ht 70.0 in | Wt 175.0 lb

## 2013-08-17 DIAGNOSIS — I2510 Atherosclerotic heart disease of native coronary artery without angina pectoris: Secondary | ICD-10-CM

## 2013-08-17 DIAGNOSIS — Z01811 Encounter for preprocedural respiratory examination: Secondary | ICD-10-CM | POA: Insufficient documentation

## 2013-08-17 DIAGNOSIS — F17200 Nicotine dependence, unspecified, uncomplicated: Secondary | ICD-10-CM | POA: Insufficient documentation

## 2013-08-17 LAB — PULMONARY FUNCTION TEST
DL/VA % pred: 83 %
DL/VA: 3.8 ml/min/mmHg/L
DLCO cor % pred: 57 %
DLCO cor: 17.83 ml/min/mmHg
DLCO unc % pred: 57 %
DLCO unc: 17.83 ml/min/mmHg
FEF 25-75 Post: 1.17 L/sec
FEF 25-75 Pre: 1.29 L/sec
FEF2575-%Change-Post: -9 %
FEF2575-%Pred-Post: 44 %
FEF2575-%Pred-Pre: 48 %
FEV1-%Change-Post: 0 %
FEV1-%Pred-Post: 72 %
FEV1-%Pred-Pre: 71 %
FEV1-Post: 2.11 L
FEV1-Pre: 2.09 L
FEV1FVC-%Change-Post: -2 %
FEV1FVC-%Pred-Pre: 88 %
FEV6-%Change-Post: 1 %
FEV6-%Pred-Post: 82 %
FEV6-%Pred-Pre: 81 %
FEV6-Post: 3.01 L
FEV6-Pre: 2.96 L
FEV6FVC-%Change-Post: 0 %
FEV6FVC-%Pred-Post: 101 %
FEV6FVC-%Pred-Pre: 102 %
FVC-%Change-Post: 3 %
FVC-%Pred-Post: 82 %
FVC-%Pred-Pre: 79 %
FVC-Post: 3.12 L
FVC-Pre: 3.02 L
Post FEV1/FVC ratio: 68 %
Post FEV6/FVC ratio: 97 %
Pre FEV1/FVC ratio: 69 %
Pre FEV6/FVC Ratio: 98 %
RV % pred: 110 %
RV: 2.51 L
TLC % pred: 85 %
TLC: 5.8 L

## 2013-08-17 NOTE — Progress Notes (Signed)
PCP is Rey,Raquel, NP Referring Provider is Nahser, Deloris PingPhilip J, MD  Chief Complaint  Patient presents with  . 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 Patient had a CVA in October of last year and now has recovered fairly well. He has diabetes and has been diabetic control has improved with significant efforts from his primary care provider--last A1c 8.5. He is reduced her smoking significantly to less than half a pack a day. He does not drink alcohol  Since his last visit PFTs were performed demonstrating ED 12.1-71%, FVC 3.0-70%, and DLCO 57%. Chest x-ray today shows chronic atelectasis -scarring right middle lobe, no evidence of CHF   Past Medical History  Diagnosis Date  . Hypertension   . Diabetes mellitus without complication   . Stroke 2013, 2014  . Hyperlipidemia   . Tobacco abuse   . CHF (congestive heart failure)     Past Surgical History  Procedure Laterality Date  . Lung biopsy      years ago  . Tee without cardioversion N/A 04/27/2013    Procedure: TRANSESOPHAGEAL ECHOCARDIOGRAM (TEE);  Surgeon: Vesta MixerPhilip J Nahser, MD;  [facility];  Service: Cardiovascular;  Laterality: N/A;    Family History  Problem Relation Age of Onset  . Scleroderma Mother     Living, 1489  . Heart disease Mother   . Diabetes Father     Living, 2389  . Hypertension Father   . Transient ischemic attack Father   . Hypertension Sister   . Hyperlipidemia Sister     Social History History  Substance Use Topics  . Smoking status: Current Every Day Smoker -- 0.50 packs/day for 50 years    Types: Cigarettes  . Smokeless tobacco: Not on file     Comment: currently smokes 1/5 ppd  . Alcohol Use: No    Current Outpatient Prescriptions  Medication Sig Dispense Refill   . aspirin 325 MG tablet Take 1 tablet (325 mg total) by mouth daily.  30 tablet  11  . atorvastatin (LIPITOR) 80 MG tablet Take 1 tablet (80 mg total) by mouth daily at 6 PM.  30 tablet  11  . carvedilol (COREG) 12.5 MG tablet Take 6.25 mg by mouth 2 (two) times daily with a meal.       . glipiZIDE (GLUCOTROL) 5 MG tablet Take 1 tablet (5 mg total) by mouth daily before breakfast.  60 tablet  9  . lisinopril (PRINIVIL,ZESTRIL) 20 MG tablet Take 1 tablet (20 mg total) by mouth daily.  30 tablet  11  . metFORMIN (GLUCOPHAGE) 500 MG tablet Take 1 tablet (500 mg total) by mouth 2 (two) times daily with a meal.  60 tablet  11   No current facility-administered medications for this visit.    No Known Allergies  Review of Systems  Right-hand dominant No recent falls from weakness following his stroke No flu or upper respiratory infection this winter No diabetic ulcers of his lower extremities No history of cardiac arrhythmia No active dental problems or difficulty swallowing  BP 150/96  Pulse 90  Resp 20  Ht 5\' 10"  (1.778 m)  Wt 175 lb (79.379 kg)  BMI 25.11 kg/m2  SpO2 97% Physical Exam Gen.-alert and responsive, comfortable and pleasant HEENT-normocephalic pupils equal dentition good Neck-no JVD mass or bruit, no adenopathy  Thorax-breath sounds clear Cardiac-heart rhythm regular without murmur or gallop Abdomen-soft nontender without pulsatile mass Extremities-mild clubbing and no edema cyanosis or tenderness Vascular-palpable pulses in the lower extremities, very mild varicosities Neurologic-normal gait no appreciable focal weakness  Diagnostic Tests: Coronary angiograms demonstrated severe LAD stenosis severe circumflex stenosis chronic occlusion RCA with a diabetic pattern of disease. EF 35%. No valvular disease echo  Impression: Patient is recovered fairly well from his stroke, his diabetes is under better control, his smoking is improved. He would benefit long-term from  multivessel bypass grafting for his severe diabetic three-vessel disease with reduced LV function.  Plan: Plan multivessel bypass grafting at [place] February 24 Patient will need to stop his metformin or 8 hours prior to surgery

## 2013-08-22 ENCOUNTER — Encounter (HOSPITAL_COMMUNITY): Payer: Self-pay | Admitting: Pharmacy Technician

## 2013-08-26 ENCOUNTER — Encounter (HOSPITAL_COMMUNITY)
Admission: RE | Admit: 2013-08-26 | Discharge: 2013-08-26 | Disposition: A | Discharge status: Home / Self Care | Payer: Medicaid Other | Source: Ambulatory Visit | Attending: Cardiothoracic Surgery | Admitting: Cardiothoracic Surgery

## 2013-08-26 ENCOUNTER — Encounter (HOSPITAL_COMMUNITY): Payer: Self-pay

## 2013-08-26 ENCOUNTER — Other Ambulatory Visit (HOSPITAL_COMMUNITY): Payer: Medicaid Other

## 2013-08-26 ENCOUNTER — Ambulatory Visit (HOSPITAL_COMMUNITY)
Admission: RE | Admit: 2013-08-26 | Discharge: 2013-08-26 | Disposition: A | Discharge status: Home / Self Care | Payer: Medicaid Other | Source: Ambulatory Visit | Attending: Cardiothoracic Surgery | Admitting: Cardiothoracic Surgery

## 2013-08-26 VITALS — BP 148/92 | HR 77 | Temp 97.8°F | Resp 20 | Ht 70.5 in | Wt 174.3 lb

## 2013-08-26 DIAGNOSIS — Z01818 Encounter for other preprocedural examination: Secondary | ICD-10-CM | POA: Insufficient documentation

## 2013-08-26 DIAGNOSIS — R9431 Abnormal electrocardiogram [ECG] [EKG]: Secondary | ICD-10-CM | POA: Insufficient documentation

## 2013-08-26 DIAGNOSIS — I2510 Atherosclerotic heart disease of native coronary artery without angina pectoris: Secondary | ICD-10-CM

## 2013-08-26 DIAGNOSIS — Z01810 Encounter for preprocedural cardiovascular examination: Secondary | ICD-10-CM | POA: Insufficient documentation

## 2013-08-26 DIAGNOSIS — Z01812 Encounter for preprocedural laboratory examination: Secondary | ICD-10-CM | POA: Insufficient documentation

## 2013-08-26 HISTORY — DX: Other amnesia: R41.3

## 2013-08-26 LAB — COMPREHENSIVE METABOLIC PANEL
ALT: 39 U/L (ref 0–53)
AST: 26 U/L (ref 0–37)
Albumin: 3.4 g/dL — ABNORMAL LOW (ref 3.5–5.2)
Alkaline Phosphatase: 99 U/L (ref 39–117)
BUN: 10 mg/dL (ref 6–23)
CO2: 22 mEq/L (ref 19–32)
Calcium: 9.1 mg/dL (ref 8.4–10.5)
Chloride: 99 mEq/L (ref 96–112)
Creatinine, Ser: 0.8 mg/dL (ref 0.50–1.35)
GFR calc Af Amer: >90 mL/min (ref >90)
GFR calc non Af Amer: >90 mL/min (ref >90)
Glucose, Bld: 319 mg/dL — ABNORMAL HIGH (ref 70–99)
Potassium: 4.2 mEq/L (ref 3.7–5.3)
Sodium: 136 mEq/L — ABNORMAL LOW (ref 137–147)
Total Bilirubin: 0.4 mg/dL (ref 0.3–1.2)
Total Protein: 6.9 g/dL (ref 6.0–8.3)

## 2013-08-26 LAB — BLOOD GAS, ARTERIAL
Acid-Base Excess: 1.2 mmol/L (ref 0.0–2.0)
Bicarbonate: 24.8 mEq/L — ABNORMAL HIGH (ref 20.0–24.0)
Drawn by: 344381
FIO2: 0.21 %
O2 Saturation: 97.3 %
Patient temperature: 98.6
TCO2: 25.9 mmol/L (ref 0–100)
pCO2 arterial: 35.9 mmHg (ref 35.0–45.0)
pH, Arterial: 7.453 — ABNORMAL HIGH (ref 7.350–7.450)
pO2, Arterial: 82.2 mmHg (ref 80.0–100.0)

## 2013-08-26 LAB — CBC
HCT: 42.2 % (ref 39.0–52.0)
Hemoglobin: 14.9 g/dL (ref 13.0–17.0)
MCH: 28.4 pg (ref 26.0–34.0)
MCHC: 35.3 g/dL (ref 30.0–36.0)
MCV: 80.5 fL (ref 78.0–100.0)
Platelets: 181 10*3/uL (ref 150–400)
RBC: 5.24 MIL/uL (ref 4.22–5.81)
RDW: 14.1 % (ref 11.5–15.5)
WBC: 7.3 10*3/uL (ref 4.0–10.5)

## 2013-08-26 LAB — URINALYSIS, ROUTINE W REFLEX MICROSCOPIC
Bilirubin Urine: NEGATIVE
Glucose, UA: >1000 mg/dL — AB
Hgb urine dipstick: NEGATIVE
Ketones, ur: NEGATIVE mg/dL
Leukocytes, UA: NEGATIVE
Nitrite: NEGATIVE
Protein, ur: NEGATIVE mg/dL
Specific Gravity, Urine: 1.021 (ref 1.005–1.030)
Urobilinogen, UA: 1 mg/dL (ref 0.0–1.0)
pH: 6 (ref 5.0–8.0)

## 2013-08-26 LAB — SURGICAL PCR SCREEN
MRSA, PCR: NEGATIVE
STAPHYLOCOCCUS AUREUS: NEGATIVE

## 2013-08-26 LAB — TYPE AND SCREEN
ABO/RH(D): O NEG
Antibody Screen: NEGATIVE

## 2013-08-26 LAB — PROTIME-INR
INR: 1.02 (ref 0.00–1.49)
Prothrombin Time: 13.2 seconds (ref 11.6–15.2)

## 2013-08-26 LAB — URINE MICROSCOPIC-ADD ON

## 2013-08-26 LAB — APTT: aPTT: 31 seconds (ref 24–37)

## 2013-08-26 LAB — HEMOGLOBIN A1C
Hgb A1c MFr Bld: 9.5 % — ABNORMAL HIGH (ref <5.7)
Mean Plasma Glucose: 226 mg/dL — ABNORMAL HIGH (ref <117)

## 2013-08-26 LAB — ABO/RH: ABO/RH(D): O NEG

## 2013-08-26 NOTE — Pre-Procedure Instructions (Signed)
Jerry Graham  08/26/2013   Your procedure is scheduled on:  Tuesday, February 24th  Report to Admitting at 0530 AM.  Call this number if you have problems the morning of surgery: [phone]   Remember:   Do not eat food or drink liquids after midnight.   Take these medicines the morning of surgery with A SIP OF WATER: coreg   Do not wear jewelry.  Do not wear lotions, powders, or perfume,deodorant.  Do not shave 48 hours prior to surgery. Men may shave face and neck.  Do not bring valuables to the hospital.  [facility] not responsible  for any belongings or valuables.               Contacts, dentures or bridgework may not be worn into surgery.  Leave suitcase in the car. After surgery it may be brought to your room.  For patients admitted to the hospital, discharge time is determined by your  treatment team.    Please read over the following fact sheets that you were given: Pain Booklet, Coughing and Deep Breathing, Blood Transfusion Information, MRSA Information and Surgical Site Infection Prevention  [place] - Preparing for Surgery  Before surgery, you can play an important role.  Because skin is not sterile, your skin needs to be as free of germs as possible.  You can reduce the number of germs on you skin by washing with CHG (chlorahexidine gluconate) soap before surgery.  CHG is an antiseptic cleaner which kills germs and bonds with the skin to continue killing germs even after washing.  Please DO NOT use if you have an allergy to CHG or antibacterial soaps.  If your skin becomes reddened/irritated stop using the CHG and inform your nurse when you arrive at Short Stay.  Do not shave (including legs and underarms) for at least 48 hours prior to the first CHG shower.  You may shave your face.  Please follow these instructions carefully:   1.  Shower with CHG Soap the night before surgery and the morning of Surgery.  2.  If you choose to wash your hair, wash your hair  first as usual with your normal shampoo.  3.  After you shampoo, rinse your hair and body thoroughly to remove the shampoo.  4.  Use CHG as you would any other liquid soap.  You can apply CHG directly to the skin and wash gently with scrungie or a clean washcloth.  5.  Apply the CHG Soap to your body ONLY FROM THE NECK DOWN.  Do not use on open wounds or open sores.  Avoid contact with your eyes, ears, mouth and genitals (private parts).  Wash genitals (private parts) with your normal soap.  6.  Wash thoroughly, paying special attention to the area where your surgery will be performed.  7.  Thoroughly rinse your body with warm water from the neck down.  8.  DO NOT shower/wash with your normal soap after using and rinsing off the CHG Soap.  9.  Pat yourself dry with a clean towel.            10.  Wear clean pajamas.            11.  Place clean sheets on your bed the night of your first shower and do not sleep with pets.  Day of Surgery  Do not apply any lotions/deodorants the morning of surgery.  Please wear clean clothes to the hospital/surgery center.

## 2013-08-26 NOTE — Progress Notes (Signed)
Primary physician - dr. Hassie Brucerey Cardiologist - dr. Melburn Poppernasher Heart cath in epic, ekg > month in epic

## 2013-08-29 ENCOUNTER — Encounter (HOSPITAL_COMMUNITY): Payer: Self-pay | Admitting: Certified Registered Nurse Anesthetist

## 2013-08-29 ENCOUNTER — Ambulatory Visit: Payer: Medicaid Other | Admitting: Cardiothoracic Surgery

## 2013-08-29 MED ORDER — NITROGLYCERIN IN D5W 200-5 MCG/ML-% IV SOLN
2.0000 ug/min | INTRAVENOUS | Status: DC
  Filled 2013-08-29: qty 250

## 2013-08-29 MED ORDER — PLASMA-LYTE 148 IV SOLN
INTRAVENOUS | Status: AC
  Administered 2013-08-30: 07:00:00
  Filled 2013-08-29: qty 2.5

## 2013-08-29 MED ORDER — EPINEPHRINE HCL 1 MG/ML IJ SOLN
0.5000 ug/min | INTRAMUSCULAR | Status: DC
  Filled 2013-08-29: qty 4

## 2013-08-29 MED ORDER — VANCOMYCIN HCL 10 G IV SOLR
1250.0000 mg | INTRAVENOUS | Status: AC
  Administered 2013-08-30: 1250 mg via INTRAVENOUS
  Filled 2013-08-29: qty 1250

## 2013-08-29 MED ORDER — DOPAMINE-DEXTROSE 3.2-5 MG/ML-% IV SOLN
2.0000 ug/kg/min | INTRAVENOUS | Status: DC
  Filled 2013-08-29: qty 250

## 2013-08-29 MED ORDER — MAGNESIUM SULFATE 50 % IJ SOLN
40.0000 meq | INTRAMUSCULAR | Status: DC
  Filled 2013-08-29: qty 10

## 2013-08-29 MED ORDER — DEXMEDETOMIDINE HCL IN NACL 400 MCG/100ML IV SOLN
0.1000 ug/kg/h | INTRAVENOUS | Status: DC
  Filled 2013-08-29: qty 100

## 2013-08-29 MED ORDER — DEXTROSE 5 % IV SOLN
750.0000 mg | INTRAVENOUS | Status: DC
  Filled 2013-08-29: qty 750

## 2013-08-29 MED ORDER — POTASSIUM CHLORIDE 2 MEQ/ML IV SOLN
80.0000 meq | INTRAVENOUS | Status: DC
  Filled 2013-08-29: qty 40

## 2013-08-29 MED ORDER — PHENYLEPHRINE HCL 10 MG/ML IJ SOLN
30.0000 ug/min | INTRAVENOUS | Status: DC
  Filled 2013-08-29: qty 2

## 2013-08-29 MED ORDER — SODIUM CHLORIDE 0.9 % IV SOLN
INTRAVENOUS | Status: DC
Start: 2013-08-30 — End: 2013-08-30
  Filled 2013-08-29: qty 1

## 2013-08-29 MED ORDER — DEXTROSE 5 % IV SOLN
1.5000 g | INTRAVENOUS | Status: AC
  Administered 2013-08-30: .75 g via INTRAVENOUS
  Administered 2013-08-30: 1.5 g via INTRAVENOUS
  Filled 2013-08-29 (×2): qty 1.5

## 2013-08-29 MED ORDER — SODIUM CHLORIDE 0.9 % IV SOLN
INTRAVENOUS | Status: DC
  Filled 2013-08-29: qty 40

## 2013-08-29 MED ORDER — SODIUM CHLORIDE 0.9 % IV SOLN
INTRAVENOUS | Status: DC
  Filled 2013-08-29: qty 30

## 2013-08-29 NOTE — Progress Notes (Signed)
Anesthesia Chart Review:  Patient is a 63 year old male scheduled for CABG on 08/30/13 by Dr. Donata ClayVan Trigt. History includes smoking, severe 3V CAD, CHF (EF 30-35%), HTN, HLD, DM2, CVA '13/'14, lung biopsy, CVA with subsequent short-term memory loss. PCP is Orville Governaquel Rey, NP.  Cardiologist is Dr. Elease HashimotoNahser.  Cardiac cath from 06/16/13 and TEE from 04/27/13 noted.   Carotid duplex on 08/26/13 showed: - The vertebral arteries appear patent with antegrade flow. - Findings consistent with 1-39 percent stenosis involving the right internal carotid artery and the left internal carotid artery.  Preoperative EKG, CXR, PFTs, and labs noted.  A1C is 9.5. Labs are in Epic for review by Dr. Donata ClayVan Trigt.  If he feels patient's DM control is acceptable the anticipate that patient can proceed.   Velna Ochsllison Deyani Hegarty, PA-C [facility]/Anesthesiology Phone [phone] 08/29/2013 9:53 AM

## 2013-08-30 ENCOUNTER — Inpatient Hospital Stay (HOSPITAL_COMMUNITY): Payer: Medicaid Other

## 2013-08-30 ENCOUNTER — Encounter (HOSPITAL_COMMUNITY): Payer: Medicaid Other | Admitting: Vascular Surgery

## 2013-08-30 ENCOUNTER — Encounter (HOSPITAL_COMMUNITY): Payer: Self-pay | Admitting: Anesthesiology

## 2013-08-30 ENCOUNTER — Inpatient Hospital Stay (HOSPITAL_COMMUNITY): Payer: Medicaid Other | Admitting: Certified Registered Nurse Anesthetist

## 2013-08-30 ENCOUNTER — Inpatient Hospital Stay (HOSPITAL_COMMUNITY)
Admission: RE | Admit: 2013-08-30 | Discharge: 2013-09-07 | DRG: 236 | Disposition: A | Discharge status: Skilled Nursing Facility | Payer: Medicaid Other | Source: Ambulatory Visit | Attending: Cardiothoracic Surgery | Admitting: Cardiothoracic Surgery

## 2013-08-30 ENCOUNTER — Encounter (HOSPITAL_COMMUNITY)
Admission: RE | Disposition: A | Discharge status: Skilled Nursing Facility | Payer: Medicaid Other | Source: Ambulatory Visit | Attending: Cardiothoracic Surgery

## 2013-08-30 DIAGNOSIS — I509 Heart failure, unspecified: Secondary | ICD-10-CM | POA: Diagnosis not present

## 2013-08-30 DIAGNOSIS — F17200 Nicotine dependence, unspecified, uncomplicated: Secondary | ICD-10-CM | POA: Diagnosis present

## 2013-08-30 DIAGNOSIS — I10 Essential (primary) hypertension: Secondary | ICD-10-CM | POA: Diagnosis present

## 2013-08-30 DIAGNOSIS — E785 Hyperlipidemia, unspecified: Secondary | ICD-10-CM | POA: Diagnosis present

## 2013-08-30 DIAGNOSIS — I472 Ventricular tachycardia, unspecified: Secondary | ICD-10-CM | POA: Diagnosis not present

## 2013-08-30 DIAGNOSIS — D62 Acute posthemorrhagic anemia: Secondary | ICD-10-CM | POA: Diagnosis not present

## 2013-08-30 DIAGNOSIS — I4729 Other ventricular tachycardia: Secondary | ICD-10-CM | POA: Diagnosis not present

## 2013-08-30 DIAGNOSIS — F411 Generalized anxiety disorder: Secondary | ICD-10-CM | POA: Diagnosis present

## 2013-08-30 DIAGNOSIS — J438 Other emphysema: Secondary | ICD-10-CM | POA: Diagnosis present

## 2013-08-30 DIAGNOSIS — I4891 Unspecified atrial fibrillation: Secondary | ICD-10-CM

## 2013-08-30 DIAGNOSIS — J9819 Other pulmonary collapse: Secondary | ICD-10-CM | POA: Diagnosis not present

## 2013-08-30 DIAGNOSIS — Z951 Presence of aortocoronary bypass graft: Secondary | ICD-10-CM

## 2013-08-30 DIAGNOSIS — R000 Tachycardia, unspecified: Secondary | ICD-10-CM | POA: Diagnosis not present

## 2013-08-30 DIAGNOSIS — I2589 Other forms of chronic ischemic heart disease: Secondary | ICD-10-CM | POA: Diagnosis present

## 2013-08-30 DIAGNOSIS — F05 Delirium due to known physiological condition: Secondary | ICD-10-CM | POA: Diagnosis not present

## 2013-08-30 DIAGNOSIS — I2510 Atherosclerotic heart disease of native coronary artery without angina pectoris: Secondary | ICD-10-CM

## 2013-08-30 DIAGNOSIS — Z8673 Personal history of transient ischemic attack (TIA), and cerebral infarction without residual deficits: Secondary | ICD-10-CM

## 2013-08-30 DIAGNOSIS — F29 Unspecified psychosis not due to a substance or known physiological condition: Secondary | ICD-10-CM | POA: Diagnosis not present

## 2013-08-30 DIAGNOSIS — IMO0002 Reserved for concepts with insufficient information to code with codable children: Secondary | ICD-10-CM | POA: Diagnosis not present

## 2013-08-30 DIAGNOSIS — Z79899 Other long term (current) drug therapy: Secondary | ICD-10-CM

## 2013-08-30 DIAGNOSIS — E119 Type 2 diabetes mellitus without complications: Secondary | ICD-10-CM | POA: Diagnosis present

## 2013-08-30 DIAGNOSIS — J90 Pleural effusion, not elsewhere classified: Secondary | ICD-10-CM | POA: Diagnosis present

## 2013-08-30 HISTORY — PX: CORONARY ARTERY BYPASS GRAFT: SHX141

## 2013-08-30 HISTORY — PX: INTRAOPERATIVE TRANSESOPHAGEAL ECHOCARDIOGRAM: SHX5062

## 2013-08-30 LAB — POCT I-STAT 4, (NA,K, GLUC, HGB,HCT)
Glucose, Bld: 242 mg/dL — ABNORMAL HIGH (ref 70–99)
Glucose, Bld: 142 mg/dL — ABNORMAL HIGH (ref 70–99)
Glucose, Bld: 168 mg/dL — ABNORMAL HIGH (ref 70–99)
Glucose, Bld: 138 mg/dL — ABNORMAL HIGH (ref 70–99)
Glucose, Bld: 174 mg/dL — ABNORMAL HIGH (ref 70–99)
Glucose, Bld: 140 mg/dL — ABNORMAL HIGH (ref 70–99)
HCT: 41 % (ref 39.0–52.0)
HCT: 29 % — ABNORMAL LOW (ref 39.0–52.0)
HCT: 36 % — ABNORMAL LOW (ref 39.0–52.0)
HCT: 32 % — ABNORMAL LOW (ref 39.0–52.0)
HCT: 30 % — ABNORMAL LOW (ref 39.0–52.0)
HCT: 36 % — ABNORMAL LOW (ref 39.0–52.0)
Hemoglobin: 13.9 g/dL (ref 13.0–17.0)
Hemoglobin: 12.2 g/dL — ABNORMAL LOW (ref 13.0–17.0)
Hemoglobin: 9.9 g/dL — ABNORMAL LOW (ref 13.0–17.0)
Hemoglobin: 10.9 g/dL — ABNORMAL LOW (ref 13.0–17.0)
Hemoglobin: 10.2 g/dL — ABNORMAL LOW (ref 13.0–17.0)
Hemoglobin: 12.2 g/dL — ABNORMAL LOW (ref 13.0–17.0)
Potassium: 4.2 mEq/L (ref 3.7–5.3)
Potassium: 3.3 mEq/L — ABNORMAL LOW (ref 3.7–5.3)
Potassium: 3.7 mEq/L (ref 3.7–5.3)
Potassium: 4.5 mEq/L (ref 3.7–5.3)
Potassium: 3.8 mEq/L (ref 3.7–5.3)
Potassium: 3.3 mEq/L — ABNORMAL LOW (ref 3.7–5.3)
Sodium: 141 mEq/L (ref 137–147)
Sodium: 140 mEq/L (ref 137–147)
Sodium: 143 mEq/L (ref 137–147)
Sodium: 140 mEq/L (ref 137–147)
Sodium: 142 mEq/L (ref 137–147)
Sodium: 143 mEq/L (ref 137–147)

## 2013-08-30 LAB — CBC
HCT: 32.9 % — ABNORMAL LOW (ref 39.0–52.0)
HEMATOCRIT: 34.7 % — AB (ref 39.0–52.0)
Hemoglobin: 12 g/dL — ABNORMAL LOW (ref 13.0–17.0)
Hemoglobin: 11.4 g/dL — ABNORMAL LOW (ref 13.0–17.0)
MCH: 27.8 pg (ref 26.0–34.0)
MCH: 27.9 pg (ref 26.0–34.0)
MCHC: 34.6 g/dL (ref 30.0–36.0)
MCHC: 34.7 g/dL (ref 30.0–36.0)
MCV: 80.5 fL (ref 78.0–100.0)
MCV: 80.4 fL (ref 78.0–100.0)
Platelets: 132 10*3/uL — ABNORMAL LOW (ref 150–400)
Platelets: 145 10*3/uL — ABNORMAL LOW (ref 150–400)
RBC: 4.31 MIL/uL (ref 4.22–5.81)
RBC: 4.09 MIL/uL — ABNORMAL LOW (ref 4.22–5.81)
RDW: 13.7 % (ref 11.5–15.5)
RDW: 13.8 % (ref 11.5–15.5)
WBC: 13.8 10*3/uL — AB (ref 4.0–10.5)
WBC: 11.1 10*3/uL — ABNORMAL HIGH (ref 4.0–10.5)

## 2013-08-30 LAB — POCT I-STAT 3, ART BLOOD GAS (G3+)
Acid-Base Excess: 1 mmol/L (ref 0.0–2.0)
Acid-Base Excess: 1 mmol/L (ref 0.0–2.0)
Bicarbonate: 26.2 mEq/L — ABNORMAL HIGH (ref 20.0–24.0)
Bicarbonate: 25.3 mEq/L — ABNORMAL HIGH (ref 20.0–24.0)
Bicarbonate: 25.7 mEq/L — ABNORMAL HIGH (ref 20.0–24.0)
O2 Saturation: 100 %
O2 Saturation: 100 %
O2 Saturation: 95 %
Patient temperature: 36
TCO2: 28 mmol/L (ref 0–100)
TCO2: 27 mmol/L (ref 0–100)
TCO2: 27 mmol/L (ref 0–100)
pCO2 arterial: 44.5 mmHg (ref 35.0–45.0)
pCO2 arterial: 40.4 mmHg (ref 35.0–45.0)
pCO2 arterial: 41.9 mmHg (ref 35.0–45.0)
pH, Arterial: 7.378 (ref 7.350–7.450)
pH, Arterial: 7.404 (ref 7.350–7.450)
pH, Arterial: 7.392 (ref 7.350–7.450)
pO2, Arterial: 338 mmHg — ABNORMAL HIGH (ref 80.0–100.0)
pO2, Arterial: 266 mmHg — ABNORMAL HIGH (ref 80.0–100.0)
pO2, Arterial: 73 mmHg — ABNORMAL LOW (ref 80.0–100.0)

## 2013-08-30 LAB — CREATININE, SERUM
Creatinine, Ser: 0.74 mg/dL (ref 0.50–1.35)
GFR calc Af Amer: >90 mL/min (ref >90)
GFR calc non Af Amer: >90 mL/min (ref >90)

## 2013-08-30 LAB — GLUCOSE, CAPILLARY
Glucose-Capillary: 246 mg/dL — ABNORMAL HIGH (ref 70–99)
Glucose-Capillary: 103 mg/dL — ABNORMAL HIGH (ref 70–99)
Glucose-Capillary: 95 mg/dL (ref 70–99)

## 2013-08-30 LAB — POCT I-STAT, CHEM 8
BUN: 6 mg/dL (ref 6–23)
Calcium, Ion: 1.12 mmol/L — ABNORMAL LOW (ref 1.13–1.30)
Chloride: 103 mEq/L (ref 96–112)
Creatinine, Ser: 0.7 mg/dL (ref 0.50–1.35)
Glucose, Bld: 144 mg/dL — ABNORMAL HIGH (ref 70–99)
HCT: 34 % — ABNORMAL LOW (ref 39.0–52.0)
Hemoglobin: 11.6 g/dL — ABNORMAL LOW (ref 13.0–17.0)
Potassium: 4.5 mEq/L (ref 3.7–5.3)
Sodium: 140 mEq/L (ref 137–147)
TCO2: 24 mmol/L (ref 0–100)

## 2013-08-30 LAB — MAGNESIUM: Magnesium: 3 mg/dL — ABNORMAL HIGH (ref 1.5–2.5)

## 2013-08-30 LAB — PROTIME-INR
INR: 1.31 (ref 0.00–1.49)
Prothrombin Time: 16 seconds — ABNORMAL HIGH (ref 11.6–15.2)

## 2013-08-30 LAB — PLATELET COUNT: Platelets: 141 10*3/uL — ABNORMAL LOW (ref 150–400)

## 2013-08-30 LAB — APTT: APTT: 35 s (ref 24–37)

## 2013-08-30 LAB — HEMOGLOBIN AND HEMATOCRIT, BLOOD
HCT: 30.7 % — ABNORMAL LOW (ref 39.0–52.0)
Hemoglobin: 10.7 g/dL — ABNORMAL LOW (ref 13.0–17.0)

## 2013-08-30 SURGERY — CORONARY ARTERY BYPASS GRAFTING (CABG)
Anesthesia: General | Site: Chest

## 2013-08-30 MED ORDER — FAMOTIDINE IN NACL 20-0.9 MG/50ML-% IV SOLN
20.0000 mg | Freq: Two times a day (BID) | INTRAVENOUS | Status: AC
  Administered 2013-08-30: 20 mg via INTRAVENOUS

## 2013-08-30 MED ORDER — FENTANYL CITRATE 0.05 MG/ML IJ SOLN
INTRAMUSCULAR | Status: AC
  Filled 2013-08-30: qty 5

## 2013-08-30 MED ORDER — VANCOMYCIN HCL IN DEXTROSE 1-5 GM/200ML-% IV SOLN
1000.0000 mg | Freq: Once | INTRAVENOUS | Status: AC
  Administered 2013-08-30: 1000 mg via INTRAVENOUS
  Filled 2013-08-30: qty 200

## 2013-08-30 MED ORDER — OXYCODONE HCL 5 MG PO TABS
5.0000 mg | ORAL_TABLET | ORAL | Status: DC | PRN
  Administered 2013-08-31: 5 mg via ORAL
  Administered 2013-08-31: 10 mg via ORAL
  Filled 2013-08-30: qty 1
  Filled 2013-08-30: qty 2

## 2013-08-30 MED ORDER — ATORVASTATIN CALCIUM 80 MG PO TABS
80.0000 mg | ORAL_TABLET | Freq: Every day | ORAL | Status: DC
  Administered 2013-08-31 – 2013-09-07 (×8): 80 mg via ORAL
  Filled 2013-08-30 (×8): qty 1

## 2013-08-30 MED ORDER — PHENYLEPHRINE HCL 10 MG/ML IJ SOLN
10.0000 mg | INTRAVENOUS | Status: DC | PRN
  Administered 2013-08-30: 15 ug/min via INTRAVENOUS

## 2013-08-30 MED ORDER — ASPIRIN EC 325 MG PO TBEC
325.0000 mg | DELAYED_RELEASE_TABLET | Freq: Every day | ORAL | Status: DC
  Administered 2013-08-31 – 2013-09-04 (×5): 325 mg via ORAL
  Filled 2013-08-30 (×5): qty 1

## 2013-08-30 MED ORDER — DOCUSATE SODIUM 100 MG PO CAPS
200.0000 mg | ORAL_CAPSULE | Freq: Every day | ORAL | Status: DC
  Administered 2013-08-31 – 2013-09-03 (×4): 200 mg via ORAL
  Filled 2013-08-30 (×5): qty 2

## 2013-08-30 MED ORDER — ALBUMIN HUMAN 5 % IV SOLN
250.0000 mL | INTRAVENOUS | Status: AC | PRN
  Administered 2013-08-30 (×3): 250 mL via INTRAVENOUS
  Filled 2013-08-30: qty 250

## 2013-08-30 MED ORDER — NITROGLYCERIN IN D5W 200-5 MCG/ML-% IV SOLN
0.0000 ug/min | INTRAVENOUS | Status: DC
  Administered 2013-08-30: 5 ug/min via INTRAVENOUS

## 2013-08-30 MED ORDER — POTASSIUM CHLORIDE 10 MEQ/50ML IV SOLN
10.0000 meq | INTRAVENOUS | Status: AC
  Administered 2013-08-30 (×3): 10 meq via INTRAVENOUS

## 2013-08-30 MED ORDER — CARVEDILOL 3.125 MG PO TABS
ORAL_TABLET | ORAL | Status: AC
  Administered 2013-08-30: 6.25 mg
  Filled 2013-08-30: qty 2

## 2013-08-30 MED ORDER — HEMOSTATIC AGENTS (NO CHARGE) OPTIME
TOPICAL | Status: DC | PRN
  Administered 2013-08-30: 1 via TOPICAL

## 2013-08-30 MED ORDER — ACETAMINOPHEN 160 MG/5ML PO SOLN: 1000.0000 mg | Freq: Four times a day (QID) | ORAL | Status: DC

## 2013-08-30 MED ORDER — MORPHINE SULFATE 2 MG/ML IJ SOLN
2.0000 mg | INTRAMUSCULAR | Status: DC | PRN
  Administered 2013-08-31: 4 mg via INTRAVENOUS
  Administered 2013-08-31 – 2013-09-01 (×3): 2 mg via INTRAVENOUS
  Filled 2013-08-30: qty 1
  Filled 2013-08-30: qty 2
  Filled 2013-08-30 (×2): qty 1

## 2013-08-30 MED ORDER — INSULIN REGULAR BOLUS VIA INFUSION
0.0000 [IU] | Freq: Three times a day (TID) | INTRAVENOUS | Status: DC
  Filled 2013-08-30: qty 10

## 2013-08-30 MED ORDER — ALBUMIN HUMAN 5 % IV SOLN
INTRAVENOUS | Status: DC | PRN
  Administered 2013-08-30 (×2): via INTRAVENOUS

## 2013-08-30 MED ORDER — SODIUM CHLORIDE 0.9 % IV SOLN
INTRAVENOUS | Status: DC
  Administered 2013-08-30: 10 mL via INTRAVENOUS

## 2013-08-30 MED ORDER — SODIUM CHLORIDE 0.45 % IV SOLN
INTRAVENOUS | Status: DC
  Administered 2013-08-30: 20 mL/h via INTRAVENOUS

## 2013-08-30 MED ORDER — EPHEDRINE SULFATE 50 MG/ML IJ SOLN
INTRAMUSCULAR | Status: AC
Start: 2013-08-30 — End: 2013-08-30
  Filled 2013-08-30: qty 1

## 2013-08-30 MED ORDER — HEPARIN SODIUM (PORCINE) 1000 UNIT/ML IJ SOLN
INTRAMUSCULAR | Status: AC
  Filled 2013-08-30: qty 1

## 2013-08-30 MED ORDER — DOPAMINE-DEXTROSE 3.2-5 MG/ML-% IV SOLN
2.0000 ug/kg/min | INTRAVENOUS | Status: DC
  Administered 2013-08-30: 3 ug/kg/min via INTRAVENOUS

## 2013-08-30 MED ORDER — ROCURONIUM BROMIDE 50 MG/5ML IV SOLN
INTRAVENOUS | Status: AC
  Filled 2013-08-30: qty 1

## 2013-08-30 MED ORDER — ONDANSETRON HCL 4 MG/2ML IJ SOLN: 4.0000 mg | Freq: Four times a day (QID) | INTRAMUSCULAR | Status: DC | PRN

## 2013-08-30 MED ORDER — HEPARIN SODIUM (PORCINE) 1000 UNIT/ML IJ SOLN
INTRAMUSCULAR | Status: DC | PRN
  Administered 2013-08-30: 5 mL via INTRAVENOUS
  Administered 2013-08-30: 18 mL via INTRAVENOUS

## 2013-08-30 MED ORDER — SODIUM CHLORIDE 0.9 % IV SOLN
100.0000 [IU] | INTRAVENOUS | Status: DC | PRN
  Administered 2013-08-30: 5.5 [IU]/h via INTRAVENOUS

## 2013-08-30 MED ORDER — DEXTROSE 5 % IV SOLN
1.5000 g | Freq: Two times a day (BID) | INTRAVENOUS | Status: AC
  Administered 2013-08-30 – 2013-09-01 (×4): 1.5 g via INTRAVENOUS
  Filled 2013-08-30 (×4): qty 1.5

## 2013-08-30 MED ORDER — LACTATED RINGERS IV SOLN
INTRAVENOUS | Status: DC | PRN
  Administered 2013-08-30: 07:00:00 via INTRAVENOUS

## 2013-08-30 MED ORDER — MILRINONE IN DEXTROSE 20 MG/100ML IV SOLN
0.1250 ug/kg/min | INTRAVENOUS | Status: AC
  Administered 2013-08-30: .3 ug/kg/min via INTRAVENOUS
  Filled 2013-08-30: qty 100

## 2013-08-30 MED ORDER — FENTANYL CITRATE 0.05 MG/ML IJ SOLN
INTRAMUSCULAR | Status: DC | PRN
Start: 2013-08-30 — End: 2013-08-30
  Administered 2013-08-30 (×2): 100 ug via INTRAVENOUS
  Administered 2013-08-30: 500 ug via INTRAVENOUS
  Administered 2013-08-30 (×3): 100 ug via INTRAVENOUS
  Administered 2013-08-30 (×2): 250 ug via INTRAVENOUS
  Administered 2013-08-30: 50 ug via INTRAVENOUS
  Administered 2013-08-30: 200 ug via INTRAVENOUS

## 2013-08-30 MED ORDER — BUDESONIDE-FORMOTEROL FUMARATE 160-4.5 MCG/ACT IN AERO
2.0000 | INHALATION_SPRAY | Freq: Two times a day (BID) | RESPIRATORY_TRACT | Status: DC
  Administered 2013-08-31 – 2013-09-07 (×13): 2 via RESPIRATORY_TRACT
  Filled 2013-08-30: qty 6

## 2013-08-30 MED ORDER — BISACODYL 5 MG PO TBEC
10.0000 mg | DELAYED_RELEASE_TABLET | Freq: Every day | ORAL | Status: DC
  Administered 2013-08-31 – 2013-09-02 (×3): 10 mg via ORAL
  Filled 2013-08-30 (×3): qty 2

## 2013-08-30 MED ORDER — LACTATED RINGERS IV SOLN
INTRAVENOUS | Status: DC | PRN
Start: 2013-08-30 — End: 2013-08-30
  Administered 2013-08-30 (×2): via INTRAVENOUS

## 2013-08-30 MED ORDER — DEXMEDETOMIDINE HCL 200 MCG/2ML IV SOLN
200.0000 ug | INTRAVENOUS | Status: DC | PRN
  Administered 2013-08-30: .3 ug/kg/h via INTRAVENOUS

## 2013-08-30 MED ORDER — PHENYLEPHRINE HCL 10 MG/ML IJ SOLN: INTRAMUSCULAR | Status: DC | PRN

## 2013-08-30 MED ORDER — PROTAMINE SULFATE 10 MG/ML IV SOLN
INTRAVENOUS | Status: DC | PRN
  Administered 2013-08-30: 250 mg via INTRAVENOUS

## 2013-08-30 MED ORDER — METOPROLOL TARTRATE 25 MG/10 ML ORAL SUSPENSION
12.5000 mg | Freq: Two times a day (BID) | ORAL | Status: DC
  Filled 2013-08-30 (×3): qty 5

## 2013-08-30 MED ORDER — LACTATED RINGERS IV SOLN
INTRAVENOUS | Status: DC
  Administered 2013-08-30: 20 mL/h via INTRAVENOUS

## 2013-08-30 MED ORDER — ROCURONIUM BROMIDE 100 MG/10ML IV SOLN
INTRAVENOUS | Status: DC | PRN
  Administered 2013-08-30: 50 mg via INTRAVENOUS

## 2013-08-30 MED ORDER — TRAMADOL HCL 50 MG PO TABS
50.0000 mg | ORAL_TABLET | Freq: Four times a day (QID) | ORAL | Status: DC | PRN
  Administered 2013-08-31 – 2013-09-03 (×3): 50 mg via ORAL
  Filled 2013-08-30 (×3): qty 1

## 2013-08-30 MED ORDER — CALCIUM CHLORIDE 10 % IV SOLN
INTRAVENOUS | Status: DC | PRN
  Administered 2013-08-30: 200 mg via INTRAVENOUS

## 2013-08-30 MED ORDER — DOPAMINE-DEXTROSE 3.2-5 MG/ML-% IV SOLN
INTRAVENOUS | Status: DC | PRN
  Administered 2013-08-30: 3 ug/kg/min via INTRAVENOUS

## 2013-08-30 MED ORDER — POTASSIUM CHLORIDE 10 MEQ/50ML IV SOLN
10.0000 meq | Freq: Once | INTRAVENOUS | Status: AC
  Administered 2013-08-30: 10 meq via INTRAVENOUS

## 2013-08-30 MED ORDER — ACETAMINOPHEN 500 MG PO TABS
1000.0000 mg | ORAL_TABLET | Freq: Four times a day (QID) | ORAL | Status: DC
  Administered 2013-08-31 – 2013-09-04 (×15): 1000 mg via ORAL
  Filled 2013-08-30 (×19): qty 2

## 2013-08-30 MED ORDER — MORPHINE SULFATE 2 MG/ML IJ SOLN
1.0000 mg | INTRAMUSCULAR | Status: AC | PRN
Start: 2013-08-30 — End: 2013-08-31
  Filled 2013-08-30: qty 1

## 2013-08-30 MED ORDER — PANTOPRAZOLE SODIUM 40 MG PO TBEC
40.0000 mg | DELAYED_RELEASE_TABLET | Freq: Every day | ORAL | Status: DC
  Administered 2013-09-01 – 2013-09-04 (×4): 40 mg via ORAL
  Filled 2013-08-30 (×4): qty 1

## 2013-08-30 MED ORDER — BISACODYL 10 MG RE SUPP: 10.0000 mg | Freq: Every day | RECTAL | Status: DC

## 2013-08-30 MED ORDER — MIDAZOLAM HCL 5 MG/5ML IJ SOLN
INTRAMUSCULAR | Status: DC | PRN
  Administered 2013-08-30: 1 mg via INTRAVENOUS
  Administered 2013-08-30: 4 mg via INTRAVENOUS
  Administered 2013-08-30: 5 mg via INTRAVENOUS

## 2013-08-30 MED ORDER — METOPROLOL TARTRATE 1 MG/ML IV SOLN
2.5000 mg | INTRAVENOUS | Status: DC | PRN
  Administered 2013-09-01 – 2013-09-03 (×4): 5 mg via INTRAVENOUS
  Filled 2013-08-30 (×4): qty 5

## 2013-08-30 MED ORDER — MIDAZOLAM HCL 10 MG/2ML IJ SOLN
INTRAMUSCULAR | Status: AC
  Filled 2013-08-30: qty 2

## 2013-08-30 MED ORDER — PROPOFOL 10 MG/ML IV BOLUS
INTRAVENOUS | Status: DC | PRN
  Administered 2013-08-30: 70 mg via INTRAVENOUS

## 2013-08-30 MED ORDER — LEVALBUTEROL HCL 1.25 MG/0.5ML IN NEBU
1.2500 mg | INHALATION_SOLUTION | Freq: Four times a day (QID) | RESPIRATORY_TRACT | Status: DC
  Administered 2013-08-30 – 2013-08-31 (×5): 1.25 mg via RESPIRATORY_TRACT
  Filled 2013-08-30 (×7): qty 0.5

## 2013-08-30 MED ORDER — MILRINONE IN DEXTROSE 20 MG/100ML IV SOLN
0.3000 ug/kg/min | INTRAVENOUS | Status: DC
  Administered 2013-08-30 (×2): 0.3 ug/kg/min via INTRAVENOUS
  Filled 2013-08-30: qty 100

## 2013-08-30 MED ORDER — SODIUM CHLORIDE 0.9 % IV SOLN
10.0000 g | INTRAVENOUS | Status: DC | PRN
  Administered 2013-08-30: 5 g/h via INTRAVENOUS

## 2013-08-30 MED ORDER — ASPIRIN 81 MG PO CHEW
324.0000 mg | CHEWABLE_TABLET | Freq: Every day | ORAL | Status: DC
Start: 2013-08-31 — End: 2013-09-04

## 2013-08-30 MED ORDER — SODIUM CHLORIDE 0.9 % IJ SOLN
OROMUCOSAL | Status: DC | PRN
  Administered 2013-08-30 (×3): via TOPICAL

## 2013-08-30 MED ORDER — PHENYLEPHRINE HCL 10 MG/ML IJ SOLN
0.0000 ug/min | INTRAVENOUS | Status: DC
  Filled 2013-08-30: qty 2

## 2013-08-30 MED ORDER — DEXMEDETOMIDINE HCL IN NACL 200 MCG/50ML IV SOLN
0.1000 ug/kg/h | INTRAVENOUS | Status: DC
Start: 2013-08-30 — End: 2013-08-31
  Administered 2013-08-30: 0.7 ug/kg/h via INTRAVENOUS
  Filled 2013-08-30: qty 50

## 2013-08-30 MED ORDER — LACTATED RINGERS IV SOLN: 500.0000 mL | Freq: Once | INTRAVENOUS | Status: DC | PRN

## 2013-08-30 MED ORDER — HEMOSTATIC AGENTS (NO CHARGE) OPTIME
TOPICAL | Status: DC | PRN
  Administered 2013-08-30 (×2): 1

## 2013-08-30 MED ORDER — SODIUM CHLORIDE 0.9 % IJ SOLN
INTRAMUSCULAR | Status: AC
  Filled 2013-08-30: qty 10

## 2013-08-30 MED ORDER — METOPROLOL TARTRATE 12.5 MG HALF TABLET
12.5000 mg | ORAL_TABLET | Freq: Two times a day (BID) | ORAL | Status: DC
  Administered 2013-08-31: 12.5 mg via ORAL
  Filled 2013-08-30 (×3): qty 1

## 2013-08-30 MED ORDER — MAGNESIUM SULFATE 4000MG/100ML IJ SOLN
4.0000 g | Freq: Once | INTRAMUSCULAR | Status: AC
  Administered 2013-08-30: 4 g via INTRAVENOUS
  Filled 2013-08-30: qty 100

## 2013-08-30 MED ORDER — 0.9 % SODIUM CHLORIDE (POUR BTL) OPTIME
TOPICAL | Status: DC | PRN
  Administered 2013-08-30: 6000 mL

## 2013-08-30 MED ORDER — INSULIN REGULAR HUMAN 100 UNIT/ML IJ SOLN
INTRAMUSCULAR | Status: DC
  Filled 2013-08-30: qty 1

## 2013-08-30 MED ORDER — ACETAMINOPHEN 160 MG/5ML PO SOLN: 650.0000 mg | Freq: Once | ORAL | Status: AC

## 2013-08-30 MED ORDER — VECURONIUM BROMIDE 10 MG IV SOLR
INTRAVENOUS | Status: DC | PRN
  Administered 2013-08-30 (×3): 5 mg via INTRAVENOUS

## 2013-08-30 MED ORDER — SODIUM CHLORIDE 0.9 % IJ SOLN
3.0000 mL | Freq: Two times a day (BID) | INTRAMUSCULAR | Status: DC
  Administered 2013-08-31 – 2013-09-04 (×8): 3 mL via INTRAVENOUS

## 2013-08-30 MED ORDER — VECURONIUM BROMIDE 10 MG IV SOLR
INTRAVENOUS | Status: AC
  Filled 2013-08-30: qty 20

## 2013-08-30 MED ORDER — MIDAZOLAM HCL 2 MG/2ML IJ SOLN
2.0000 mg | INTRAMUSCULAR | Status: DC | PRN
  Administered 2013-09-01: 2 mg via INTRAVENOUS
  Filled 2013-08-30: qty 2

## 2013-08-30 MED ORDER — SODIUM CHLORIDE 0.9 % IJ SOLN: 3.0000 mL | INTRAMUSCULAR | Status: DC | PRN

## 2013-08-30 MED ORDER — LEVALBUTEROL HCL 1.25 MG/0.5ML IN NEBU
1.2500 mg | INHALATION_SOLUTION | Freq: Four times a day (QID) | RESPIRATORY_TRACT | Status: DC
Start: 2013-08-30 — End: 2013-08-30
  Filled 2013-08-30 (×4): qty 0.5

## 2013-08-30 MED ORDER — SODIUM CHLORIDE 0.9 % IV SOLN: 250.0000 mL | INTRAVENOUS | Status: DC

## 2013-08-30 MED ORDER — ACETAMINOPHEN 650 MG RE SUPP
650.0000 mg | Freq: Once | RECTAL | Status: AC
  Administered 2013-08-30: 650 mg via RECTAL

## 2013-08-30 SURGICAL SUPPLY — 103 items
ADAPTER CARDIO PERF ANTE/RETRO (ADAPTER) ×4 IMPLANT
ATTRACTOMAT 16X20 MAGNETIC DRP (DRAPES) ×4 IMPLANT
BAG DECANTER FOR FLEXI CONT (MISCELLANEOUS) ×4 IMPLANT
BANDAGE ELASTIC 4 VELCRO ST LF (GAUZE/BANDAGES/DRESSINGS) ×4 IMPLANT
BANDAGE ELASTIC 6 VELCRO ST LF (GAUZE/BANDAGES/DRESSINGS) ×4 IMPLANT
BANDAGE GAUZE ELAST BULKY 4 IN (GAUZE/BANDAGES/DRESSINGS) ×4 IMPLANT
BASKET HEART  (ORDER IN 25'S) (MISCELLANEOUS) ×1
BASKET HEART (ORDER IN 25'S) (MISCELLANEOUS) ×1
BASKET HEART (ORDER IN 25S) (MISCELLANEOUS) ×2 IMPLANT
BLADE STERNUM SYSTEM 6 (BLADE) ×4 IMPLANT
BLADE SURG 12 STRL SS (BLADE) ×4 IMPLANT
BLADE SURG ROTATE 9660 (MISCELLANEOUS) IMPLANT
BNDG GAUZE ELAST 4 BULKY (GAUZE/BANDAGES/DRESSINGS) ×4 IMPLANT
CANISTER SUCTION 2500CC (MISCELLANEOUS) ×4 IMPLANT
CANNULA GUNDRY RCSP 15FR (MISCELLANEOUS) ×4 IMPLANT
CANNULA VENOUS LOW PROF 32X40 (CANNULA) IMPLANT
CARDIAC SUCTION (MISCELLANEOUS) ×4 IMPLANT
CATH CPB KIT VANTRIGT (MISCELLANEOUS) ×4 IMPLANT
CATH ROBINSON RED A/P 18FR (CATHETERS) ×12 IMPLANT
CATH THORACIC 36FR RT ANG (CATHETERS) ×4 IMPLANT
CLIP TI WIDE RED SMALL 24 (CLIP) ×4 IMPLANT
COVER SURGICAL LIGHT HANDLE (MISCELLANEOUS) ×4 IMPLANT
CRADLE DONUT ADULT HEAD (MISCELLANEOUS) ×4 IMPLANT
DRAIN CHANNEL 32F RND 10.7 FF (WOUND CARE) ×4 IMPLANT
DRAPE CARDIOVASCULAR INCISE (DRAPES) ×2
DRAPE SLUSH/WARMER DISC (DRAPES) ×4 IMPLANT
DRAPE SRG 135X102X78XABS (DRAPES) ×2 IMPLANT
DRSG AQUACEL AG ADV 3.5X14 (GAUZE/BANDAGES/DRESSINGS) ×4 IMPLANT
ELECT BLADE 4.0 EZ CLEAN MEGAD (MISCELLANEOUS) ×4
ELECT BLADE 6.5 EXT (BLADE) ×4 IMPLANT
ELECT CAUTERY BLADE 6.4 (BLADE) ×4 IMPLANT
ELECT REM PT RETURN 9FT ADLT (ELECTROSURGICAL) ×8
ELECTRODE BLDE 4.0 EZ CLN MEGD (MISCELLANEOUS) ×2 IMPLANT
ELECTRODE REM PT RTRN 9FT ADLT (ELECTROSURGICAL) ×4 IMPLANT
GLOVE BIO SURGEON STRL SZ 6.5 (GLOVE) ×30 IMPLANT
GLOVE BIO SURGEON STRL SZ7 (GLOVE) ×8 IMPLANT
GLOVE BIO SURGEON STRL SZ7.5 (GLOVE) ×24 IMPLANT
GLOVE BIO SURGEONS STRL SZ 6.5 (GLOVE) ×10
GLOVE BIOGEL PI IND STRL 7.0 (GLOVE) ×14 IMPLANT
GLOVE BIOGEL PI INDICATOR 7.0 (GLOVE) ×14
GOWN STRL REUS W/ TWL LRG LVL3 (GOWN DISPOSABLE) ×8 IMPLANT
GOWN STRL REUS W/TWL LRG LVL3 (GOWN DISPOSABLE) ×8
HEMOSTAT POWDER SURGIFOAM 1G (HEMOSTASIS) ×12 IMPLANT
HEMOSTAT SURGICEL 2X14 (HEMOSTASIS) ×4 IMPLANT
INSERT FOGARTY XLG (MISCELLANEOUS) IMPLANT
KIT BASIN OR (CUSTOM PROCEDURE TRAY) ×4 IMPLANT
KIT ROOM TURNOVER OR (KITS) ×4 IMPLANT
KIT SUCTION CATH 14FR (SUCTIONS) ×4 IMPLANT
KIT VASOVIEW W/TROCAR VH 2000 (KITS) ×4 IMPLANT
LEAD PACING MYOCARDI (MISCELLANEOUS) ×4 IMPLANT
MARKER GRAFT CORONARY BYPASS (MISCELLANEOUS) ×12 IMPLANT
NS IRRIG 1000ML POUR BTL (IV SOLUTION) ×24 IMPLANT
PACK OPEN HEART (CUSTOM PROCEDURE TRAY) ×4 IMPLANT
PAD ARMBOARD 7.5X6 YLW CONV (MISCELLANEOUS) ×8 IMPLANT
PAD ELECT DEFIB RADIOL ZOLL (MISCELLANEOUS) ×4 IMPLANT
PENCIL BUTTON HOLSTER BLD 10FT (ELECTRODE) ×4 IMPLANT
PUNCH AORTIC ROTATE 4.0MM (MISCELLANEOUS) IMPLANT
PUNCH AORTIC ROTATE 4.5MM 8IN (MISCELLANEOUS) ×4 IMPLANT
PUNCH AORTIC ROTATE 5MM 8IN (MISCELLANEOUS) IMPLANT
SET CARDIOPLEGIA MPS 5001102 (MISCELLANEOUS) ×4 IMPLANT
SPONGE GAUZE 4X4 12PLY (GAUZE/BANDAGES/DRESSINGS) ×8 IMPLANT
SPONGE GAUZE 4X4 12PLY STER LF (GAUZE/BANDAGES/DRESSINGS) ×8 IMPLANT
SPONGE LAP 18X18 X RAY DECT (DISPOSABLE) ×4 IMPLANT
SPONGE LAP 4X18 X RAY DECT (DISPOSABLE) ×4 IMPLANT
STOPCOCK 4 WAY LG BORE MALE ST (IV SETS) ×4 IMPLANT
SURGIFLO W/THROMBIN 8M KIT (HEMOSTASIS) ×4 IMPLANT
SUT BONE WAX W31G (SUTURE) ×4 IMPLANT
SUT ETHIBOND 2 0 SH (SUTURE) ×4
SUT ETHIBOND 2 0 SH 36X2 (SUTURE) ×4 IMPLANT
SUT MNCRL AB 4-0 PS2 18 (SUTURE) IMPLANT
SUT PROLENE 3 0 SH DA (SUTURE) ×4 IMPLANT
SUT PROLENE 3 0 SH1 36 (SUTURE) IMPLANT
SUT PROLENE 4 0 RB 1 (SUTURE) ×4
SUT PROLENE 4 0 SH DA (SUTURE) ×4 IMPLANT
SUT PROLENE 4-0 RB1 .5 CRCL 36 (SUTURE) ×4 IMPLANT
SUT PROLENE 5 0 C 1 36 (SUTURE) IMPLANT
SUT PROLENE 6 0 C 1 30 (SUTURE) ×16 IMPLANT
SUT PROLENE 6 0 CC (SUTURE) ×16 IMPLANT
SUT PROLENE 8 0 BV175 6 (SUTURE) IMPLANT
SUT PROLENE BLUE 7 0 (SUTURE) ×8 IMPLANT
SUT SILK  1 MH (SUTURE)
SUT SILK 1 MH (SUTURE) IMPLANT
SUT SILK 2 0 SH CR/8 (SUTURE) ×4 IMPLANT
SUT SILK 3 0 SH CR/8 (SUTURE) IMPLANT
SUT STEEL 6MS V (SUTURE) ×8 IMPLANT
SUT STEEL STERNAL CCS#1 18IN (SUTURE) ×4 IMPLANT
SUT STEEL SZ 6 DBL 3X14 BALL (SUTURE) ×8 IMPLANT
SUT VIC AB 1 CTX 36 (SUTURE) ×4
SUT VIC AB 1 CTX36XBRD ANBCTR (SUTURE) ×4 IMPLANT
SUT VIC AB 2-0 CT1 27 (SUTURE) ×2
SUT VIC AB 2-0 CT1 TAPERPNT 27 (SUTURE) ×2 IMPLANT
SUT VIC AB 2-0 CTX 27 (SUTURE) IMPLANT
SUT VIC AB 3-0 X1 27 (SUTURE) ×4 IMPLANT
SUTURE E-PAK OPEN HEART (SUTURE) ×4 IMPLANT
SYSTEM SAHARA CHEST DRAIN ATS (WOUND CARE) ×4 IMPLANT
TAPE CLOTH SURG 4X10 WHT LF (GAUZE/BANDAGES/DRESSINGS) ×4 IMPLANT
TAPE PAPER 2X10 WHT MICROPORE (GAUZE/BANDAGES/DRESSINGS) ×4 IMPLANT
TOWEL OR 17X24 6PK STRL BLUE (TOWEL DISPOSABLE) ×8 IMPLANT
TOWEL OR 17X26 10 PK STRL BLUE (TOWEL DISPOSABLE) ×8 IMPLANT
TRAY FOLEY IC TEMP SENS 14FR (CATHETERS) ×4 IMPLANT
TUBING INSUFFLATION 10FT LAP (TUBING) ×4 IMPLANT
UNDERPAD 30X30 INCONTINENT (UNDERPADS AND DIAPERS) ×4 IMPLANT
WATER STERILE IRR 1000ML POUR (IV SOLUTION) ×8 IMPLANT

## 2013-08-30 NOTE — OR Nursing (Signed)
1st call to SICU @ 1200 2nd call to SICU @ 1240 3rd call to SICU @ 1250

## 2013-08-30 NOTE — Progress Notes (Signed)
The patient was examined and preop studies reviewed. There has been no change from the prior exam and the patient is ready for surgery.  Plan CABG today

## 2013-08-30 NOTE — Brief Op Note (Signed)
      [address]       [place] [id number]             [phone]     08/30/2013  11:24 AM  PATIENT:  Jerry Graham  63 y.o. male  PRE-OPERATIVE DIAGNOSIS:  CAD  POST-OPERATIVE DIAGNOSIS:  CAD  PROCEDURE:  Procedure(s): CORONARY ARTERY BYPASS GRAFTING (CABG)X3 LIMA-LAD; SVG-OM1; SVG-OM2 INTRAOPERATIVE TR[facility]ARDIOGRAM [facility] RIGHT THIGH  SURGEON:  Surgeon(s): Kerin PernaPeter Van Trigt, MD  PHYSICIAN ASSISTANT: Ezri Landers PA-C  ANESTHESIA:   general  PATIENT CONDITION:  ICU - intubated and hemodynamically stable.  PRE-OPERATIVE WEIGHT: 79kg  COMPLICATIONS: NO KNOWN

## 2013-08-30 NOTE — Progress Notes (Signed)
CT surgery p.m. Rounds  Status post CABG x3 earlier today Stable hemodynamics Precedex has been off the patient still too sleepy to extubate, has intermittently been awake and moved all extremities and responded 6R labs reviewed and are satisfactory

## 2013-08-30 NOTE — Transfer of Care (Signed)
Immediate Anesthesia Transfer of Care Note  Patient: Jerry Graham  Procedure(s) Performed: Procedure(s) with comments: CORONARY ARTERY BYPASS GRAFTING (CABG) (N/A) - CABG x  3 , using left internal mammary artery and right leg greater saphenous vein harvested endoscopically INTRAOPERATIVE TRANSESOPHAGEAL ECHOCARDIOGRAM (N/A)  Patient Location: SICU  Anesthesia Type:General  Level of Consciousness: Patient remains intubated per anesthesia plan  Airway & Oxygen Therapy: Patient remains intubated per anesthesia plan  Post-op Assessment: Report given to PACU RN  Post vital signs: Reviewed and stable  Complications: No apparent anesthesia complications

## 2013-08-30 NOTE — Progress Notes (Signed)
  Echocardiogram Echocardiogram Transesophageal (OR) has been performed.  Jerry GuildCHUNG, Jerry Graham 08/30/2013, 10:58 AM

## 2013-08-30 NOTE — Anesthesia Preprocedure Evaluation (Addendum)
Anesthesia Evaluation  Patient identified by MRN, date of birth, ID band Patient awake    Reviewed: Allergy & Precautions, H&P , Patient's Chart, lab work & pertinent test results  Airway Mallampati: II TM Distance: >3 FB Neck ROM: Full    Dental  (+) Partial Upper   Pulmonary Current Smoker,  breath sounds clear to auscultation        Cardiovascular hypertension, + CAD and +CHF Rhythm:Regular Rate:Normal  EF 30 to 35%   Neuro/Psych CVA    GI/Hepatic   Endo/Other  diabetes, Type 2, Oral Hypoglycemic Agents  Renal/GU      Musculoskeletal   Abdominal (+)  Abdomen: soft.    Peds  Hematology   Anesthesia Other Findings   Reproductive/Obstetrics                          Anesthesia Physical Anesthesia Plan  ASA: III  Anesthesia Plan: General   Post-op Pain Management:    Induction: Intravenous  Airway Management Planned: Oral ETT  Additional Equipment: Arterial line, CVP, PA Cath and TEE  Intra-op Plan:   Post-operative Plan: Post-operative intubation/ventilation  Informed Consent: I have reviewed the patients History and Physical, chart, labs and discussed the procedure including the risks, benefits and alternatives for the proposed anesthesia with the patient or authorized representative who has indicated his/her understanding and acceptance.   Dental advisory given  Plan Discussed with: CRNA, Anesthesiologist and Surgeon  Anesthesia Plan Comments:        Anesthesia Quick Evaluation

## 2013-08-30 NOTE — Procedures (Signed)
Extubation Procedure Note  Patient Details:   Name: Jerry ArtistRobert Zambito DOB: October 14, 1950 MRN: [id number]   Airway Documentation:  Airway 8 mm (Active)  Secured at (cm) 23 cm 08/30/2013  7:12 PM  Measured From Lips 08/30/2013  7:12 PM  Secured Location Right 08/30/2013  7:12 PM  Secured By Pink Tape 08/30/2013  7:12 PM  Site Condition Dry 08/30/2013  4:17 PM    Evaluation  O2 sats: stable throughout Complications: No apparent complications Patient did tolerate procedure well. Bilateral Breath Sounds: Clear Suctioning: Airway Yes  Patient was extubated without incident and per rapid wean protocol.  Nif -20, VC 1.3L prior to extubation.  Patient was placed on 4L  with sats 96%.  RN at bedside.  Patient is able to vocalize post extubation.  Donnelly AngelicaMcCollum, Aleiah Mohammed Ann 08/30/2013, 11:12 PM

## 2013-08-30 NOTE — Anesthesia Postprocedure Evaluation (Signed)
  Anesthesia Post-op Note  Patient: Jerry Graham  Procedure(s) Performed: Procedure(s) with comments: CORONARY ARTERY BYPASS GRAFTING (CABG) (N/A) - CABG x  3 , using left internal mammary artery and right leg greater saphenous vein harvested endoscopically INTRAOPERATIVE TRANSESOPHAGEAL ECHOCARDIOGRAM (N/A)  Patient Location: SICU Anesthesia Type:General  Level of Consciousness: sedated and Patient remains intubated per anesthesia plan  Airway and Oxygen Therapy: Patient remains intubated per anesthesia plan and Patient placed on Ventilator (see vital sign flow sheet for setting)  Post-op Pain: none  Post-op Assessment: Post-op Vital signs reviewed, Patient's Cardiovascular Status Stable, Respiratory Function Stable, Patent Airway, No signs of Nausea or vomiting and Pain level controlled  Post-op Vital Signs: stable  Complications: No apparent anesthesia complications

## 2013-08-31 ENCOUNTER — Inpatient Hospital Stay (HOSPITAL_COMMUNITY): Payer: Medicaid Other

## 2013-08-31 ENCOUNTER — Encounter (HOSPITAL_COMMUNITY): Payer: Self-pay | Admitting: Cardiothoracic Surgery

## 2013-08-31 LAB — GLUCOSE, CAPILLARY
Glucose-Capillary: 101 mg/dL — ABNORMAL HIGH (ref 70–99)
Glucose-Capillary: 102 mg/dL — ABNORMAL HIGH (ref 70–99)
Glucose-Capillary: 104 mg/dL — ABNORMAL HIGH (ref 70–99)
Glucose-Capillary: 104 mg/dL — ABNORMAL HIGH (ref 70–99)
Glucose-Capillary: 104 mg/dL — ABNORMAL HIGH (ref 70–99)
Glucose-Capillary: 105 mg/dL — ABNORMAL HIGH (ref 70–99)
Glucose-Capillary: 105 mg/dL — ABNORMAL HIGH (ref 70–99)
Glucose-Capillary: 109 mg/dL — ABNORMAL HIGH (ref 70–99)
Glucose-Capillary: 117 mg/dL — ABNORMAL HIGH (ref 70–99)
Glucose-Capillary: 126 mg/dL — ABNORMAL HIGH (ref 70–99)
Glucose-Capillary: 131 mg/dL — ABNORMAL HIGH (ref 70–99)
Glucose-Capillary: 135 mg/dL — ABNORMAL HIGH (ref 70–99)
Glucose-Capillary: 139 mg/dL — ABNORMAL HIGH (ref 70–99)
Glucose-Capillary: 142 mg/dL — ABNORMAL HIGH (ref 70–99)
Glucose-Capillary: 144 mg/dL — ABNORMAL HIGH (ref 70–99)
Glucose-Capillary: 204 mg/dL — ABNORMAL HIGH (ref 70–99)
Glucose-Capillary: 213 mg/dL — ABNORMAL HIGH (ref 70–99)
Glucose-Capillary: 239 mg/dL — ABNORMAL HIGH (ref 70–99)
Glucose-Capillary: 98 mg/dL (ref 70–99)
Glucose-Capillary: 99 mg/dL (ref 70–99)

## 2013-08-31 LAB — CBC
HCT: 33.2 % — ABNORMAL LOW (ref 39.0–52.0)
HEMATOCRIT: 32.2 % — AB (ref 39.0–52.0)
Hemoglobin: 10.9 g/dL — ABNORMAL LOW (ref 13.0–17.0)
Hemoglobin: 11.5 g/dL — ABNORMAL LOW (ref 13.0–17.0)
MCH: 27.2 pg (ref 26.0–34.0)
MCH: 27.9 pg (ref 26.0–34.0)
MCHC: 33.9 g/dL (ref 30.0–36.0)
MCHC: 34.6 g/dL (ref 30.0–36.0)
MCV: 80.3 fL (ref 78.0–100.0)
MCV: 80.6 fL (ref 78.0–100.0)
Platelets: 154 10*3/uL (ref 150–400)
Platelets: 163 10*3/uL (ref 150–400)
RBC: 4.01 MIL/uL — ABNORMAL LOW (ref 4.22–5.81)
RBC: 4.12 MIL/uL — ABNORMAL LOW (ref 4.22–5.81)
RDW: 13.8 % (ref 11.5–15.5)
RDW: 14 % (ref 11.5–15.5)
WBC: 12.8 10*3/uL — ABNORMAL HIGH (ref 4.0–10.5)
WBC: 12.7 10*3/uL — ABNORMAL HIGH (ref 4.0–10.5)

## 2013-08-31 LAB — POCT I-STAT 3, ART BLOOD GAS (G3+)
Acid-base deficit: 1 mmol/L (ref 0.0–2.0)
Acid-base deficit: 2 mmol/L (ref 0.0–2.0)
Bicarbonate: 23.8 mEq/L (ref 20.0–24.0)
Bicarbonate: 24.9 mEq/L — ABNORMAL HIGH (ref 20.0–24.0)
Bicarbonate: 25.6 mEq/L — ABNORMAL HIGH (ref 20.0–24.0)
O2 Saturation: 96 %
O2 Saturation: 98 %
O2 Saturation: 99 %
Patient temperature: 37.1
Patient temperature: 37.2
Patient temperature: 37.3
TCO2: 25 mmol/L (ref 0–100)
TCO2: 26 mmol/L (ref 0–100)
TCO2: 27 mmol/L (ref 0–100)
pCO2 arterial: 43.6 mmHg (ref 35.0–45.0)
pCO2 arterial: 44.7 mmHg (ref 35.0–45.0)
pCO2 arterial: 45.5 mmHg — ABNORMAL HIGH (ref 35.0–45.0)
pH, Arterial: 7.346 — ABNORMAL LOW (ref 7.350–7.450)
pH, Arterial: 7.347 — ABNORMAL LOW (ref 7.350–7.450)
pH, Arterial: 7.367 (ref 7.350–7.450)
pO2, Arterial: 104 mmHg — ABNORMAL HIGH (ref 80.0–100.0)
pO2, Arterial: 123 mmHg — ABNORMAL HIGH (ref 80.0–100.0)
pO2, Arterial: 90 mmHg (ref 80.0–100.0)

## 2013-08-31 LAB — POCT I-STAT, CHEM 8
BUN: 11 mg/dL (ref 6–23)
Calcium, Ion: 1.12 mmol/L — ABNORMAL LOW (ref 1.13–1.30)
Chloride: 98 mEq/L (ref 96–112)
Creatinine, Ser: 1 mg/dL (ref 0.50–1.35)
Glucose, Bld: 254 mg/dL — ABNORMAL HIGH (ref 70–99)
HCT: 35 % — ABNORMAL LOW (ref 39.0–52.0)
Hemoglobin: 11.9 g/dL — ABNORMAL LOW (ref 13.0–17.0)
Potassium: 4.1 mEq/L (ref 3.7–5.3)
Sodium: 136 mEq/L — ABNORMAL LOW (ref 137–147)
TCO2: 26 mmol/L (ref 0–100)

## 2013-08-31 LAB — BASIC METABOLIC PANEL
BUN: 8 mg/dL (ref 6–23)
CO2: 25 mEq/L (ref 19–32)
Calcium: 7.9 mg/dL — ABNORMAL LOW (ref 8.4–10.5)
Chloride: 105 mEq/L (ref 96–112)
Creatinine, Ser: 0.68 mg/dL (ref 0.50–1.35)
GFR calc non Af Amer: >90 mL/min (ref >90)
Glucose, Bld: 114 mg/dL — ABNORMAL HIGH (ref 70–99)
POTASSIUM: 4.2 meq/L (ref 3.7–5.3)
SODIUM: 142 meq/L (ref 137–147)

## 2013-08-31 LAB — CREATININE, SERUM
Creatinine, Ser: 0.9 mg/dL (ref 0.50–1.35)
GFR calc Af Amer: >90 mL/min (ref >90)
GFR calc non Af Amer: 88 mL/min — ABNORMAL LOW (ref >90)

## 2013-08-31 LAB — MAGNESIUM
MAGNESIUM: 2.5 mg/dL (ref 1.5–2.5)
Magnesium: 2.4 mg/dL (ref 1.5–2.5)

## 2013-08-31 MED ORDER — THIAMINE HCL 100 MG/ML IJ SOLN
100.0000 mg | Freq: Every day | INTRAMUSCULAR | Status: DC
  Administered 2013-08-31: 100 mg via INTRAVENOUS
  Filled 2013-08-31 (×2): qty 1

## 2013-08-31 MED ORDER — DOPAMINE-DEXTROSE 3.2-5 MG/ML-% IV SOLN: 2.5000 ug/kg/min | INTRAVENOUS | Status: DC

## 2013-08-31 MED ORDER — FUROSEMIDE 10 MG/ML IJ SOLN
20.0000 mg | Freq: Two times a day (BID) | INTRAMUSCULAR | Status: DC
  Administered 2013-08-31 – 2013-09-01 (×4): 20 mg via INTRAVENOUS
  Filled 2013-08-31 (×7): qty 2

## 2013-08-31 MED ORDER — NICOTINE 21 MG/24HR TD PT24
21.0000 mg | MEDICATED_PATCH | Freq: Every day | TRANSDERMAL | Status: DC
  Administered 2013-08-31 – 2013-09-04 (×5): 21 mg via TRANSDERMAL
  Filled 2013-08-31 (×5): qty 1

## 2013-08-31 MED ORDER — INSULIN ASPART 100 UNIT/ML ~~LOC~~ SOLN
0.0000 [IU] | SUBCUTANEOUS | Status: DC
  Administered 2013-08-31: 2 [IU] via SUBCUTANEOUS
  Administered 2013-08-31 – 2013-09-01 (×2): 8 [IU] via SUBCUTANEOUS
  Administered 2013-09-01: 1 [IU] via SUBCUTANEOUS
  Administered 2013-09-01: 2 [IU] via SUBCUTANEOUS
  Administered 2013-09-01: 4 [IU] via SUBCUTANEOUS
  Administered 2013-09-02 (×2): 8 [IU] via SUBCUTANEOUS
  Administered 2013-09-02 – 2013-09-03 (×3): 2 [IU] via SUBCUTANEOUS
  Administered 2013-09-03 (×2): 8 [IU] via SUBCUTANEOUS
  Administered 2013-09-04: 2 [IU] via SUBCUTANEOUS
  Administered 2013-09-04: 4 [IU] via SUBCUTANEOUS
  Administered 2013-09-04 (×2): 2 [IU] via SUBCUTANEOUS

## 2013-08-31 MED ORDER — INSULIN DETEMIR 100 UNIT/ML ~~LOC~~ SOLN
30.0000 [IU] | Freq: Two times a day (BID) | SUBCUTANEOUS | Status: DC
  Administered 2013-09-01: 30 [IU] via SUBCUTANEOUS
  Filled 2013-08-31 (×4): qty 0.3

## 2013-08-31 MED ORDER — METOCLOPRAMIDE HCL 5 MG/ML IJ SOLN
10.0000 mg | Freq: Four times a day (QID) | INTRAMUSCULAR | Status: DC | PRN
  Administered 2013-08-31: 10 mg via INTRAVENOUS
  Filled 2013-08-31: qty 2

## 2013-08-31 MED ORDER — INSULIN DETEMIR 100 UNIT/ML ~~LOC~~ SOLN
20.0000 [IU] | Freq: Two times a day (BID) | SUBCUTANEOUS | Status: DC
  Filled 2013-08-31: qty 0.2

## 2013-08-31 MED ORDER — INSULIN ASPART 100 UNIT/ML ~~LOC~~ SOLN
12.0000 [IU] | Freq: Once | SUBCUTANEOUS | Status: AC
  Administered 2013-08-31: 12 [IU] via SUBCUTANEOUS

## 2013-08-31 MED ORDER — INSULIN DETEMIR 100 UNIT/ML ~~LOC~~ SOLN
15.0000 [IU] | Freq: Two times a day (BID) | SUBCUTANEOUS | Status: DC
  Administered 2013-08-31: 15 [IU] via SUBCUTANEOUS
  Filled 2013-08-31 (×2): qty 0.15

## 2013-08-31 MED ORDER — METOPROLOL TARTRATE 25 MG PO TABS
25.0000 mg | ORAL_TABLET | Freq: Two times a day (BID) | ORAL | Status: DC
  Administered 2013-08-31 – 2013-09-02 (×4): 25 mg via ORAL
  Filled 2013-08-31 (×5): qty 1

## 2013-08-31 MED FILL — Mannitol IV Soln 20%: INTRAVENOUS | Qty: 500 | Status: AC

## 2013-08-31 MED FILL — Lidocaine HCl IV Inj 20 MG/ML: INTRAVENOUS | Qty: 10 | Status: AC

## 2013-08-31 MED FILL — Potassium Chloride Inj 2 mEq/ML: INTRAVENOUS | Qty: 40 | Status: AC

## 2013-08-31 MED FILL — Heparin Sodium (Porcine) Inj 1000 Unit/ML: INTRAMUSCULAR | Qty: 10 | Status: AC

## 2013-08-31 MED FILL — Heparin Sodium (Porcine) Inj 1000 Unit/ML: INTRAMUSCULAR | Qty: 30 | Status: AC

## 2013-08-31 MED FILL — Electrolyte-R (PH 7.4) Solution: INTRAVENOUS | Qty: 4000 | Status: AC

## 2013-08-31 MED FILL — Sodium Bicarbonate IV Soln 8.4%: INTRAVENOUS | Qty: 50 | Status: AC

## 2013-08-31 MED FILL — Magnesium Sulfate Inj 50%: INTRAMUSCULAR | Qty: 10 | Status: AC

## 2013-08-31 MED FILL — Sodium Chloride IV Soln 0.9%: INTRAVENOUS | Qty: 2000 | Status: AC

## 2013-08-31 MED FILL — Dexmedetomidine HCl IV Soln 200 MCG/2ML: INTRAVENOUS | Qty: 2 | Status: AC

## 2013-08-31 NOTE — Progress Notes (Signed)
Inpatient Diabetes Program Recommendations  AACE/ADA: New Consensus Statement on Inpatient Glycemic Control (2013)  Target Ranges:  Prepandial:   less than 140 mg/dL      Peak postprandial:   less than 180 mg/dL (1-2 hours)      Critically ill patients:  140 - 180 mg/dL   Reason for Assessment: Results for Jolaine ArtistERKINS, Krista (MRN [id number]) as of 08/31/2013 11:31  Ref. Range 08/26/2013 16:03  Hemoglobin A1C Latest Range: <5.7 % 9.5 (H)   Diabetes history: Type 2 Diabetes, Post-CABG Outpatient Diabetes medications:Glucotrol 5 mg daily, Metformin 500 mg bid Current orders for Inpatient glycemic control: Patient transitioning off insulin drip to Levemir 15 units bid and TCTS q 4 hours.    Patient would benefit from improved outpatient glycemic control.  May need insulin at discharge?  Will follow.  Beryl MeagerJenny Cherl Gorney, RN, BC-ADM Inpatient Diabetes Coordinator Pager [phone]

## 2013-08-31 NOTE — Evaluation (Signed)
Clinical/Bedside Swallow Evaluation Patient Details  Name: Jerry Graham MRN: [id number] Date of Birth: Nov 10, 1950  Today's Date: 08/31/2013 Time: 2130-8657 SLP Time Calculation (min): 25 min  Past Medical History:  Past Medical History  Diagnosis Date  . Hypertension   . Diabetes mellitus without complication   . Stroke 2013, 2014  . Hyperlipidemia   . Tobacco abuse   . CHF (congestive heart failure)   . Short-term memory loss     from stroke   Past Surgical History:  Past Surgical History  Procedure Laterality Date  . Lung biopsy      years ago  . Tee without cardioversion N/A 04/27/2013    Procedure: TRANSESOPHAGEAL ECHOCARDIOGRAM (TEE);  Surgeon: Vesta Mixer, MD;  [facility] ENDOSCOPY;  Service: Cardiovascular;  Laterality: N/A;  . Coronary artery bypass graft N/A 08/30/2013    Procedure: CORONARY ARTERY BYPASS GRAFTING (CABG);  Surgeon: Kerin Perna, MD;  Location: [facility] OR;  Service: Open Heart Surgery;  Laterality: N/A;  CABG x  3 , using left internal mammary artery and right leg greater saphenous vein harvested endoscopically  . Intraoperative transesophageal echocardiogram N/A 08/30/2013    Procedure: INTRAOPERATIVE TRANSESOPHAGEAL ECHOCARDIOGRAM;  Surgeon: Kerin Perna, MD;  Location: [facility] OR;  Service: Open Heart Surgery;  Laterality: N/A;   HPI:  63 year old male admitted 08/30/13 for CABG.  PMH significant for CVA, DM, CHF.  BSE ordered to evaluate swallow function and safety in light of dysphagia 03/2013 following CVA.   Assessment / Plan / Recommendation Clinical Impression  Pt reported recollection of MBS in September, and stated he had received SLP services for dysphagia.  Pt reports tolerateing regular diet with thin liquids once dysphagia resolved.  No overt s/s aspiration observed or reported with any consistency tested, however, pt did exhibit inconsistent cough response to aspiration on MBS in September.  Will recommend advancing as tolerated to regular  diet. SLP to follow briefly for diet tolerance.    Aspiration Risk  Mild    Diet Recommendation Thin liquid;Regular   Medication Administration: Whole meds with liquid Supervision: Patient able to self feed Compensations: Slow rate;Small sips/bites Postural Changes and/or Swallow Maneuvers: Seated upright 90 degrees    Other  Recommendations Oral Care Recommendations: Oral care BID Other Recommendations: Clarify dietary restrictions   Follow Up Recommendations  Inpatient Rehab    Frequency and Duration min 1 x/week  1 week   Pertinent Vitals/Pain VSS, chest pain 5/10. RN notified    SLP Swallow Goals  adherence to safe swallow precautions   Swallow Study Prior Functional Status   Improvement in swallow function and safety after 03/2013 MBS, with tolerance of regular diet and thin liquids prior to CABG.    General Date of Onset: 08/30/13 HPI: 63 year old male admitted 08/30/13 for CABG.  PMH significant for CVA, DM, CHF.  BSE ordered to evaluate swallow function and safety in light of dysphagia 03/2013 following CVA. Type of Study: Bedside swallow evaluation Previous Swallow Assessment: MBS 03/31/13 Diet Prior to this Study: Thin liquids;Other (Comment) (clear liquids) Temperature Spikes Noted: No Respiratory Status: Nasal cannula History of Recent Intubation: Yes Length of Intubations (days): 1 days Date extubated: 08/30/13 Behavior/Cognition: Alert;Cooperative;Pleasant mood;Distractible Oral Cavity - Dentition: Adequate natural dentition Self-Feeding Abilities: Able to feed self Patient Positioning: Upright in chair Baseline Vocal Quality: Clear Volitional Cough: Weak Volitional Swallow: Able to elicit    Oral/Motor/Sensory Function Overall Oral Motor/Sensory Function: Appears within functional limits for tasks assessed   Ice  Chips Ice chips: Not tested   Thin Liquid Thin Liquid: Within functional limits Presentation: Straw;Self Fed    Nectar Thick Nectar Thick  Liquid: Not tested   Honey Thick Honey Thick Liquid: Not tested   Puree Puree: Within functional limits Presentation: Self Fed;Spoon   Solid   GO    Solid: Within functional limits Presentation: Self Fed      Celia B. Murvin NatalBueche, [facility] Surgery CenterMSP, CCC-SLP [phone]  Leigh AuroraBueche, Celia Brown 08/31/2013,2:56 PM

## 2013-08-31 NOTE — Progress Notes (Addendum)
TCTS DAILY ICU PROGRESS NOTE                   [address]            [place] [address]          [phone]   1 Day Post-Op Procedure(s) (LRB): CORONARY ARTERY BYPASS GRAFTING (CABG) (N/A) INTRAOPERATIVE TRANSESOPHAGEAL ECHOCARDIOGRAM (N/A)  Total Length of Stay:  LOS: 1 day   Subjective: Feels ok  Objective: Vital signs in last 24 hours: Temp:  [96.6 F (35.9 C)-99.3 F (37.4 C)] 98.8 F (37.1 C) (02/25 0715) Pulse Rate:  [82-106] 102 (02/25 0715) Cardiac Rhythm:  [-] Sinus tachycardia (02/25 0400) Resp:  [0-26] 15 (02/25 0715) BP: (83-136)/(57-84) 113/80 mmHg (02/25 0700) SpO2:  [93 %-100 %] 95 % (02/25 0715) Arterial Line BP: (88-159)/(49-77) 154/68 mmHg (02/25 0715) FiO2 (%):  [40 %-50 %] 40 % (02/24 2232) Weight:  [175 lb (79.379 kg)-179 lb 10.8 oz (81.5 kg)] 179 lb 10.8 oz (81.5 kg) (02/25 0448)  Filed Weights   08/29/13 1300 08/30/13 1330 08/31/13 0448  Weight: 174 lb 4.8 oz (79.062 kg) 175 lb (79.379 kg) 179 lb 10.8 oz (81.5 kg)  Weight change: 11.2 oz (0.318 kg)   Hemodynamic parameters for last 24 hours: PAP: (19-47)/(9-30) 32/15 mmHg CO:  [3.9 L/min-5.4 L/min] 5.4 L/min CI:  [2 L/min/m2-2.7 L/min/m2] 2.7 L/min/m2  Intake/Output from previous day: 02/24 0701 - 02/25 0700 In: 6640.7 [I.V.:4424.7; Blood:616; IV Piggyback:1600] Out: 4705 [Urine:3130; Emesis/NG output:50; Blood:975; Chest Tube:550]  Intake/Output this shift:    Current Meds: Scheduled Meds: . acetaminophen  1,000 mg Oral 4 times per day   Or  . acetaminophen (TYLENOL) oral liquid 160 mg/5 mL  1,000 mg Per Tube 4 times per day  . aspirin EC  325 mg Oral Daily   Or  . aspirin  324 mg Per Tube Daily  . atorvastatin  80 mg Oral q1800  . bisacodyl  10 mg Oral Daily   Or  . bisacodyl  10 mg Rectal Daily  . budesonide-formoterol  2 puff Inhalation BID  . cefUROXime (ZINACEF)  IV  1.5 g Intravenous Q12H  . docusate sodium  200 mg Oral Daily  . famotidine (PEPCID) IV   20 mg Intravenous Q12H  . insulin regular  0-10 Units Intravenous TID WC  . levalbuterol  1.25 mg Nebulization Q6H  . metoprolol tartrate  12.5 mg Oral BID   Or  . metoprolol tartrate  12.5 mg Per Tube BID  . [START ON 09/01/2013] pantoprazole  40 mg Oral Daily  . sodium chloride  3 mL Intravenous Q12H   Continuous Infusions: . sodium chloride 20 mL/hr (08/30/13 1315)  . sodium chloride 10 mL (08/30/13 1315)  . sodium chloride    . DOPamine 3 mcg/kg/min (08/31/13 0700)  . insulin (NOVOLIN-R) infusion 1.1 mL/hr at 08/31/13 0700  . lactated ringers 20 mL/hr (08/30/13 1315)  . nitroGLYCERIN Stopped (08/30/13 1330)  . phenylephrine (NEO-SYNEPHRINE) Adult infusion Stopped (08/31/13 0500)   PRN Meds:.albumin human, metoprolol, midazolam, morphine injection, ondansetron (ZOFRAN) IV, oxyCODONE, sodium chloride, traMADol  General appearance: alert, cooperative, no distress and confused but re-orients easily Heart: regular rate and rhythm Lungs: dim in left base Abdomen: soft, nontender Extremities: no edema Wound: dressings CDI Neuro- moves extrem , follows commands   Lab Results: CBC:  Recent Labs  08/30/13 1930 08/30/13 2000 08/31/13 0413  WBC 11.1*  --  12.8*  HGB 11.4* 11.6* 10.9*  HCT  32.9* 34.0* 32.2*  PLT 145*  --  154   BMET:   Recent Labs  08/30/13 2000 08/31/13 0413  NA 140 142  K 4.5 4.2  CL 103 105  CO2  --  25  GLUCOSE 144* 114*  BUN 6 8  CREATININE 0.70 0.68  CALCIUM  --  7.9*    PT/INR:   Recent Labs  08/30/13 1330  LABPROT 16.0*  INR 1.31   ABG    Component Value Date/Time   PHART 7.347* 08/31/2013 0414   PCO2ART 45.5* 08/31/2013 0414   PO2ART 104.0* 08/31/2013 0414   HCO3 24.9* 08/31/2013 0414   TCO2 26 08/31/2013 0414   ACIDBASEDEF 1.0 08/31/2013 0414   O2SAT 98.0 08/31/2013 0414     Radiology: Dg Chest Portable 1 View  08/30/2013   CLINICAL DATA:  Status post CABG.  EXAM: PORTABLE CHEST - 1 VIEW  COMPARISON:  DG CHEST 2 VIEW dated  08/26/2013  FINDINGS: Interval median sternotomy. Endotracheal tube terminates 3.0 cm above carina. Right IJ Swan-Ganz catheter appropriately positioned with tip at pulmonary outflow tract.  Left-sided chest tube and mediastinal drain are unchanged in position. Nasogastric tube terminates at the body of the stomach.  Cardiomegaly accentuated by AP portable technique. Atherosclerosis in the transverse aorta. Possible small left pleural effusion. No pneumothorax. No congestive failure. Low lung volumes with mild bibasilar atelectasis, greater on the left.  IMPRESSION: Expected appearance after median sternotomy, with appropriate position of support apparatus and no evidence of pneumothorax.  Possible small left pleural effusion with bibasilar volume loss/atelectasis.   Electronically Signed   By: Jeronimo GreavesKyle  Talbot M.D.   On: 08/30/2013 14:02     Assessment/Plan: S/P Procedure(s) (LRB): CORONARY ARTERY BYPASS GRAFTING (CABG) (N/A) INTRAOPERATIVE TRANSESOPHAGEAL ECHOCARDIOGRAM (N/A)  1 doing well 2 hemodyn stable on dop/milrinone- wean as able 3 mod CT drainage- monitor, prob d/c mediastinal tubes soon 4 Expectd ABL anemia- monitor 5 ABG looks good, CXR with some vasc congestion, small Left effusion and basilar atx- push pulm toilet. Good UO/ normal creat- will need some diuresis/ ace inhibitor 6 routine rehab modalities 7 routine nutrition- diet advancement 8 sugars controlled- was on glipizide and metformin preop- cont insulin for now GOLD,WAYNE E 08/31/2013 7:47 AM    Will wean drips and remove lines, OOB to chair Lasix diuresis  patient examined and medical record reviewed,agree with above note. VAN TRIGT III,Kavion Mancinas 08/31/2013

## 2013-08-31 NOTE — Progress Notes (Signed)
TCTS BRIEF SICU PROGRESS NOTE  1 Day Post-Op  S/P Procedure(s) (LRB): CORONARY ARTERY BYPASS GRAFTING (CABG) (N/A) INTRAOPERATIVE TRANSESOPHAGEAL ECHOCARDIOGRAM (N/A)   Stable day NSR-sinus tach w/ BP 155/75 O2 sats 95% on 2 L/min via [place] Diuresing some Labs okay CBG's trending up since insulin drip stopped  Plan: Will increase dosage of metoprolol and levemir insulin, restart insulin drip if necessary per protocol  Jerry Graham H 08/31/2013 6:31 PM

## 2013-08-31 NOTE — Op Note (Signed)
NAME:  Jerry Graham, Jerry Graham NO.:  [id number]  MEDICAL RECORD NO.:  [id number]  LOCATION:  2S14C                        FACILITY:  [facility]  PHYSICIAN:  Kerin Perna, M.D.  DATE OF BIRTH:  19-Aug-1950  DATE OF PROCEDURE:  08/30/2013 DATE OF DISCHARGE:                              OPERATIVE REPORT   OPERATION: 1. Coronary artery bypass grafting x3 (left internal mammary artery to     LAD, saphenous vein graft to OM1, saphenous vein graft to OM2) .Marland Kitchen 2. Endoscopic harvest of right leg greater saphenous vein.  SURGEON:  Kerin Perna, MD  ASSISTANT:  Rowe Clack, PA-C  ANESTHESIA:  General by Maren Beach, MD  INDICATIONS:  The patient is a 63 year old, African-American male, who was hospitalized in the fall of 2014, with acute hemorrhagic stroke.  He was very hypertensive and also had positive cardiac enzymes.  During his hospitalization for his stroke from which he had a fairly good recovery, he underwent cardiac catheterization which demonstrated EF of 35% with severe three-vessel coronary artery disease including chronic occlusion of the RCA.  A 2D echocardiogram showed no significant valvular disease. Because of the patient's diabetic history, severe three-vessel disease and significant LV dysfunction, he was felt to be candidate for surgical revascularization.  I followed the patient in the office to allow for greater than 3 months from recovery of his stroke.  During that period of time, we also worked on optimizing his medical conditions including chronic diabetes with elevated hemoglobin A1c, and active smoking over a pack a day.  When his smoking and diabetic status were improved and after he had recovered from his stroke, surgical coronary revascularization was scheduled.  Prior to surgery, I discussed the procedure of CABG with the patient extensively including the indications, benefits, alternatives, and expected postop recovery.  I had discussed  with the patient the risks to him of CABG including risks of MI, worsening stroke, bleeding, blood transfusion requirement, infection, arrhythmia, pleural effusion, and death.  He understood that due to his significant COPD, it would be at increased risk for pulmonary problems following CABG including pneumonia, pleural effusion, and prolonged ventilator dependence.  After reviewing these issues, he demonstrated his understanding and agreed to proceed with surgery under what I felt was an informed consent.  OPERATIVE FINDINGS: 1. Severe COPD with also acute inflammatory changes noted in the     lungs. 2. No blood products required for this surgery. 3. Severely diseased coronary vessels, non-graftable RCA due to     chronic occlusion and atresia of the vessels.  OPERATIVE PROCEDURE:  The patient was brought to the operating room and placed supine on the operating table.  General anesthesia was induced under invasive hemodynamic monitoring.  The chest, abdomen, and legs were prepped with Betadine and draped as a sterile field.  A proper time- out was performed.  A sternal incision was made as the saphenous vein was harvested endoscopically from the right leg.  The left internal mammary artery was harvested and a pedicle graft from its origin at the subclavian vessels.  It was somewhat sclerotic small vessel, but had a good flow.  Lungs were expanded and found to  be hyperinflated from emphysema as well as erythematous and inflamed from active smoking changes.  The Sternal retractor was placed and the pericardium was opened and suspended.  The aorta was inspected, palpated and examined.  It was minimally enlarged, but less than 4 cm in diameter.  Pursestrings were placed in the ascending aorta and right atrium and after the vein was harvested, heparin was administered and ACT was documented as being therapeutic.  The patient was cannulated and placed on cardiopulmonary bypass.  The  coronary arteries were identified for grafting and the mammary artery and vein grafts were prepared for the distal anastomoses. Cardioplegia cannulas were placed for both antegrade and retrograde cold blood cardioplegia.  The patient was cooled to 32 degrees.  The aortic crossclamp was applied.  One liter of cold blood cardioplegia was delivered in split doses between the antegrade aortic and retrograde coronary sinus catheters.  There was good cardioplegic arrest and septal temperature dropped less than 14 degrees.  Exposure of the heart was difficult due to the patient's increased AP diameter of his chest from emphysema and due to thickening in LVH of the heart from hypertension.  The distal coronary anastomoses were then performed.  First distal anastomosis was to the OM2.  This was a 1.5-mm vessel with heavily calcified proximal 90% stenosis.  A reverse saphenous vein was sewn end- to-side with running 7-0 Prolene with good flow through the graft. Cardioplegia was redosed.  The second distal anastomosis was the OM1.  This was a smaller 1.2-mm vessel with proximal 90% stenosis.  A reverse saphenous vein was sewn end-to-side with running 7-0 Prolene with good flow through graft. Cardioplegia was redosed.  Third distal anastomosis was to the distal third of the LAD.  It was diffusely diseased and had a proximal 90% stenosis.  The left IMA pedicle was brought through an opening and the left lateral pericardium, was brought down onto the LAD and sewn end-to-side with running 8-0 Prolene.  There was good flow through the anastomosis after briefly releasing the pedicle bulldog on the mammary artery.  The bulldog was reapplied and the pedicle was secured to the epicardium with 6-0 Prolene.  Cardioplegia was redosed.  While the crossclamp was in place, 2 proximal vein anastomoses were performed on the ascending aorta using a 4.5 mm punch and running 6-0 Prolene.  Prior to tying down the  final proximal anastomosis, air was vented from the coronaries with a dose of retrograde warm blood cardioplegia.  The crossclamp was removed.  The heart was cardioverted back to a regular rhythm.  The vein grafts were de-aired and opened, each had good flow and hemostasis was documented at the proximal distal anastomoses.  The patient was rewarmed and reperfused and the cardioplegia cannula was removed.  Temporary pacing wires were applied.  The lungs re-expanded. The ventilator was resumed.  The patient was adequately reperfused and rewarmed.  He was weaned off of cardiopulmonary bypass without difficulty on low-dose milrinone and dopamine.  Transesophageal echo showed improved global LV function.  Protamine was administered without adverse reaction.  The cannulas were removed.  The mediastinum was irrigated with warm saline.  The superior pericardial fat was closed over the aorta.  The anterior mediastinal and left pleural chest tube were placed and brought out through separate incisions.  The sternum was closed with interrupted steel wire.  The pectoralis fascia was closed in running #1 Vicryl.  The subcutaneous and skin layers were closed in running Vicryl and sterile dressings were  applied. Total cardiopulmonary bypass time was 110 minutes.     Kerin Perna, M.D.     PV/MEDQ  D:  08/30/2013  T:  08/31/2013  Job:  [id number]  cc:   Vesta Mixer, M.D.

## 2013-09-01 ENCOUNTER — Inpatient Hospital Stay (HOSPITAL_COMMUNITY): Payer: Medicaid Other

## 2013-09-01 LAB — CBC
HCT: 27.8 % — ABNORMAL LOW (ref 39.0–52.0)
Hemoglobin: 9.6 g/dL — ABNORMAL LOW (ref 13.0–17.0)
MCH: 28.1 pg (ref 26.0–34.0)
MCHC: 34.5 g/dL (ref 30.0–36.0)
MCV: 81.3 fL (ref 78.0–100.0)
Platelets: 138 10*3/uL — ABNORMAL LOW (ref 150–400)
RBC: 3.42 MIL/uL — ABNORMAL LOW (ref 4.22–5.81)
RDW: 14 % (ref 11.5–15.5)
WBC: 12.7 10*3/uL — ABNORMAL HIGH (ref 4.0–10.5)

## 2013-09-01 LAB — BASIC METABOLIC PANEL
BUN: 13 mg/dL (ref 6–23)
CO2: 22 mEq/L (ref 19–32)
Calcium: 6.6 mg/dL — ABNORMAL LOW (ref 8.4–10.5)
Chloride: 107 mEq/L (ref 96–112)
Creatinine, Ser: 0.72 mg/dL (ref 0.50–1.35)
GFR calc Af Amer: >90 mL/min (ref >90)
GFR calc non Af Amer: >90 mL/min (ref >90)
Glucose, Bld: 100 mg/dL — ABNORMAL HIGH (ref 70–99)
Potassium: 3.3 mEq/L — ABNORMAL LOW (ref 3.7–5.3)
Sodium: 142 mEq/L (ref 137–147)

## 2013-09-01 LAB — GLUCOSE, CAPILLARY
Glucose-Capillary: 120 mg/dL — ABNORMAL HIGH (ref 70–99)
Glucose-Capillary: 121 mg/dL — ABNORMAL HIGH (ref 70–99)
Glucose-Capillary: 127 mg/dL — ABNORMAL HIGH (ref 70–99)
Glucose-Capillary: 144 mg/dL — ABNORMAL HIGH (ref 70–99)
Glucose-Capillary: 184 mg/dL — ABNORMAL HIGH (ref 70–99)
Glucose-Capillary: 95 mg/dL (ref 70–99)

## 2013-09-01 MED ORDER — VITAMIN B-1 100 MG PO TABS
100.0000 mg | ORAL_TABLET | Freq: Every day | ORAL | Status: DC
  Administered 2013-09-01 – 2013-09-07 (×7): 100 mg via ORAL
  Filled 2013-09-01 (×7): qty 1

## 2013-09-01 MED ORDER — HALOPERIDOL LACTATE 5 MG/ML IJ SOLN
2.0000 mg | Freq: Four times a day (QID) | INTRAMUSCULAR | Status: DC | PRN
  Administered 2013-09-01 – 2013-09-03 (×2): 2 mg via INTRAVENOUS
  Filled 2013-09-01 (×3): qty 1

## 2013-09-01 MED ORDER — LISINOPRIL 10 MG PO TABS
10.0000 mg | ORAL_TABLET | Freq: Every day | ORAL | Status: DC
  Administered 2013-09-01 – 2013-09-03 (×3): 10 mg via ORAL
  Filled 2013-09-01 (×3): qty 1

## 2013-09-01 MED ORDER — MORPHINE SULFATE 2 MG/ML IJ SOLN: 2.0000 mg | Freq: Four times a day (QID) | INTRAMUSCULAR | Status: DC | PRN

## 2013-09-01 MED ORDER — HALOPERIDOL LACTATE 5 MG/ML IJ SOLN
5.0000 mg | Freq: Once | INTRAMUSCULAR | Status: AC
  Administered 2013-09-01: 5 mg via INTRAMUSCULAR

## 2013-09-01 MED ORDER — POTASSIUM CHLORIDE CRYS ER 20 MEQ PO TBCR: 40.0000 meq | EXTENDED_RELEASE_TABLET | Freq: Two times a day (BID) | ORAL | Status: DC

## 2013-09-01 MED ORDER — HALOPERIDOL LACTATE 5 MG/ML IJ SOLN
INTRAMUSCULAR | Status: AC
  Filled 2013-09-01: qty 1

## 2013-09-01 MED ORDER — ENOXAPARIN SODIUM 40 MG/0.4ML ~~LOC~~ SOLN
40.0000 mg | SUBCUTANEOUS | Status: DC
  Administered 2013-09-01 – 2013-09-06 (×5): 40 mg via SUBCUTANEOUS
  Filled 2013-09-01 (×9): qty 0.4

## 2013-09-01 MED ORDER — MIDAZOLAM HCL 2 MG/2ML IJ SOLN
2.0000 mg | Freq: Four times a day (QID) | INTRAMUSCULAR | Status: DC | PRN
  Administered 2013-09-01: 2 mg via INTRAVENOUS
  Filled 2013-09-01: qty 2

## 2013-09-01 MED ORDER — INSULIN DETEMIR 100 UNIT/ML ~~LOC~~ SOLN
25.0000 [IU] | Freq: Two times a day (BID) | SUBCUTANEOUS | Status: DC
  Administered 2013-09-01 (×2): 25 [IU] via SUBCUTANEOUS
  Filled 2013-09-01 (×4): qty 0.25

## 2013-09-01 MED ORDER — LEVALBUTEROL HCL 1.25 MG/0.5ML IN NEBU
1.2500 mg | INHALATION_SOLUTION | Freq: Three times a day (TID) | RESPIRATORY_TRACT | Status: DC
  Administered 2013-09-01 – 2013-09-04 (×10): 1.25 mg via RESPIRATORY_TRACT
  Filled 2013-09-01 (×13): qty 0.5

## 2013-09-01 MED ORDER — POTASSIUM CHLORIDE CRYS ER 20 MEQ PO TBCR
40.0000 meq | EXTENDED_RELEASE_TABLET | Freq: Two times a day (BID) | ORAL | Status: AC
  Administered 2013-09-01 – 2013-09-02 (×2): 40 meq via ORAL
  Filled 2013-09-01 (×2): qty 2

## 2013-09-01 NOTE — Progress Notes (Signed)
eLink Physician-Brief Progress Note Patient Name: Sholom Alperin DOB: 1[name]/17/1952 MRN: [id number]  Date of Service  09/01/2013   HPI/Events of Note   Delirium with Agitation, combative, climbing out of bed  eICU Interventions  Haldol 5mg  IM now   Intervention Category Minor Interventions: Agitation / anxiety - evaluation and management  MCQUAID, DOUGLAS 09/01/2013, 4:51 AM

## 2013-09-01 NOTE — Progress Notes (Addendum)
TCTS DAILY ICU PROGRESS NOTE                   [address]            [place] [address]          [phone]   2 Days Post-Op Procedure(s) (LRB): CORONARY ARTERY BYPASS GRAFTING (CABG) (N/A) INTRAOPERATIVE TRANSESOPHAGEAL ECHOCARDIOGRAM (N/A)  Total Length of Stay:  LOS: 2 days   Subjective: More confusion this am.   Objective: Vital signs in last 24 hours: Temp:  [97.4 F (36.3 C)-99.3 F (37.4 C)] 97.4 F (36.3 C) (02/26 0739) Pulse Rate:  [88-107] 92 (02/26 0700) Cardiac Rhythm:  [-] Normal sinus rhythm (02/26 0400) Resp:  [12-24] 15 (02/26 0700) BP: (95-166)/(62-153) 136/97 mmHg (02/26 0700) SpO2:  [89 %-97 %] 92 % (02/26 0700) Arterial Line BP: (132-168)/(69-82) 165/82 mmHg (02/25 1015) Weight:  [179 lb 0.2 oz (81.2 kg)] 179 lb 0.2 oz (81.2 kg) (02/26 0500)  Filed Weights   08/30/13 1330 08/31/13 0448 09/01/13 0500  Weight: 175 lb (79.379 kg) 179 lb 10.8 oz (81.5 kg) 179 lb 0.2 oz (81.2 kg)    Weight change: 4 lb 0.2 oz (1.821 kg)   Hemodynamic parameters for last 24 hours: PAP: (30-45)/(15-28) 32/18 mmHg CO:  [6.5 L/min] 6.5 L/min CI:  [3.3 L/min/m2] 3.3 L/min/m2  Intake/Output from previous day: 02/25 0701 - 02/26 0700 In: 1172.8 [P.O.:720; I.V.:352.8; IV Piggyback:100] Out: 1290 [Urine:1190; Chest Tube:100]  Intake/Output this shift:    Current Meds: Scheduled Meds: . acetaminophen  1,000 mg Oral 4 times per day   Or  . acetaminophen (TYLENOL) oral liquid 160 mg/5 mL  1,000 mg Per Tube 4 times per day  . aspirin EC  325 mg Oral Daily   Or  . aspirin  324 mg Per Tube Daily  . atorvastatin  80 mg Oral q1800  . bisacodyl  10 mg Oral Daily   Or  . bisacodyl  10 mg Rectal Daily  . budesonide-formoterol  2 puff Inhalation BID  . cefUROXime (ZINACEF)  IV  1.5 g Intravenous Q12H  . docusate sodium  200 mg Oral Daily  . furosemide  20 mg Intravenous BID  . haloperidol lactate      . insulin aspart  0-24 Units Subcutaneous 6 times  per day  . insulin detemir  30 Units Subcutaneous BID  . insulin regular  0-10 Units Intravenous TID WC  . levalbuterol  1.25 mg Nebulization TID  . metoprolol tartrate  25 mg Oral BID  . nicotine  21 mg Transdermal Daily  . pantoprazole  40 mg Oral Daily  . sodium chloride  3 mL Intravenous Q12H  . thiamine  100 mg Intravenous Daily   Continuous Infusions: . sodium chloride Stopped (08/31/13 1200)  . sodium chloride 10 mL (08/30/13 1315)  . sodium chloride    . insulin (NOVOLIN-R) infusion Stopped (08/31/13 1200)  . lactated ringers Stopped (08/31/13 1500)  . nitroGLYCERIN Stopped (08/31/13 1500)   PRN Meds:.metoCLOPramide (REGLAN) injection, metoprolol, midazolam, morphine injection, ondansetron (ZOFRAN) IV, oxyCODONE, sodium chloride, traMADol  General appearance: alert, distracted, fatigued, no distress and re-orients fairly easily Heart: regular rate and rhythm Lungs: clear to auscultation bilaterally Abdomen: benign Extremities: no edema Wound: chest dressing CDI EVH incis healing well  Lab Results: CBC: Recent Labs  08/31/13 1700 08/31/13 1818 09/01/13 0530  WBC 12.7*  --  12.7*  HGB 11.5* 11.9* 9.6*  HCT 33.2* 35.0* 27.8*  PLT 163  --  138*   BMET:  Recent Labs  08/31/13 0413  08/31/13 1818 09/01/13 0530  NA 142  --  136* 142  K 4.2  --  4.1 3.3*  CL 105  --  98 107  CO2 25  --   --  22  GLUCOSE 114*  --  254* 100*  BUN 8  --  11 13  CREATININE 0.68  < > 1.00 0.72  CALCIUM 7.9*  --   --  6.6*  < > = values in this interval not displayed.  PT/INR:  Recent Labs  08/30/13 1330  LABPROT 16.0*  INR 1.31   Radiology: Dg Chest Portable 1 View In Am  08/31/2013   CLINICAL DATA:  Coronary artery bypass  EXAM: PORTABLE CHEST - 1 VIEW  COMPARISON:  DG CHEST 1V PORT dated 08/30/2013  FINDINGS: Endotracheal and NG tubes removed. Swan-Ganz catheter and chest tubes stable. No pneumothorax. Bibasilar atelectasis left greater than right. Normal pulmonary  vascularity. No interstitial edema.  IMPRESSION: Extubated.  Bibasilar atelectasis left greater than right.   Electronically Signed   By: Maryclare BeanArt  Hoss M.D.   On: 08/31/2013 08:11   Dg Chest Portable 1 View  08/30/2013   CLINICAL DATA:  Status post CABG.  EXAM: PORTABLE CHEST - 1 VIEW  COMPARISON:  DG CHEST 2 VIEW dated 08/26/2013  FINDINGS: Interval median sternotomy. Endotracheal tube terminates 3.0 cm above carina. Right IJ Swan-Ganz catheter appropriately positioned with tip at pulmonary outflow tract.  Left-sided chest tube and mediastinal drain are unchanged in position. Nasogastric tube terminates at the body of the stomach.  Cardiomegaly accentuated by AP portable technique. Atherosclerosis in the transverse aorta. Possible small left pleural effusion. No pneumothorax. No congestive failure. Low lung volumes with mild bibasilar atelectasis, greater on the left.  IMPRESSION: Expected appearance after median sternotomy, with appropriate position of support apparatus and no evidence of pneumothorax.  Possible small left pleural effusion with bibasilar volume loss/atelectasis.   Electronically Signed   By: Jeronimo GreavesKyle  Talbot M.D.   On: 08/30/2013 14:02     Assessment/Plan: S/P Procedure(s) (LRB): CORONARY ARTERY BYPASS GRAFTING (CABG) (N/A) INTRAOPERATIVE TRANSESOPHAGEAL ECHOCARDIOGRAM (N/A)  1 delerium- multifactorial- ICU psychosis , sleep deprivation,pain meds, previous stroke. No alcohol use in history but asking for a cigarette and beer. Consider ativan 2 sugars pretty well controlled 3 labs stable, needs K+ replacement 4 appears BP will tol restart of ace inhibitor. Start at half home dose    GOLD,WAYNE E 09/01/2013 7:48 AM   Severe COPD- cont nebs, mobilize OOB, LLL atelectasis on CXR Confusion/agitation- PRN low doses of haldol  patient examined and medical record reviewed,agree with above note. VAN TRIGT III,PETER 09/01/2013

## 2013-09-01 NOTE — Progress Notes (Signed)
Pt very agitated, verbally abusive attempting to pull at central line. 2Mg  Haldol given at 1030, 2mg  Versed given at 1200. Pt currently resting comfortably, BP 117/69, HR 93, O2 sats 94 on 2L. Will cont to monitor. Mikhail Hallenbeck L

## 2013-09-01 NOTE — Progress Notes (Signed)
Patient ID: Jerry ArtistRobert Graham, male   DOB: 07-29-1950, 63 y.o.   MRN: [id number] EVENING ROUNDS NOTE :     [address]       [place] [id number]             [phone]                 2 Days Post-Op Procedure(s) (LRB): CORONARY ARTERY BYPASS GRAFTING (CABG) (N/A) INTRAOPERATIVE TRANSESOPHAGEAL ECHOCARDIOGRAM (N/A)  Total Length of Stay:  LOS: 2 days  BP 111/71  Pulse 93  Temp(Src) 97.7 F (36.5 C) (Oral)  Resp 14  Ht 5\' 10"  (1.778 m)  Wt 179 lb 0.2 oz (81.2 kg)  BMI 25.69 kg/m2  SpO2 96%  .Intake/Output     02/25 0701 - 02/26 0700 02/26 0701 - 02/27 0700   P.O. 720 240   I.V. (mL/kg) 352.8 (4.3)    Blood     IV Piggyback 100 50   Total Intake(mL/kg) 1172.8 (14.4) 290 (3.6)   Urine (mL/kg/hr) 1190 (0.6) 600 (0.7)   Emesis/NG output     Blood     Chest Tube 100 (0.1)    Total Output 1290 600   Net -117.2 -310        Urine Occurrence 1 x      . sodium chloride Stopped (08/31/13 1200)  . sodium chloride 10 mL (08/30/13 1315)  . sodium chloride    . lactated ringers Stopped (08/31/13 1500)  . nitroGLYCERIN Stopped (08/31/13 1500)     Lab Results  Component Value Date   WBC 12.7* 09/01/2013   HGB 9.6* 09/01/2013   HCT 27.8* 09/01/2013   PLT 138* 09/01/2013   GLUCOSE 100* 09/01/2013   CHOL 275* 03/31/2013   TRIG 202* 03/31/2013   HDL 38* 03/31/2013   LDLCALC 197* 03/31/2013   ALT 39 08/26/2013   AST 26 08/26/2013   NA 142 09/01/2013   K 3.3* 09/01/2013   CL 107 09/01/2013   CREATININE 0.72 09/01/2013   BUN 13 09/01/2013   CO2 22 09/01/2013   INR 1.31 08/30/2013   HGBA1C 9.5* 08/26/2013   confusion and combative behavior this am is better Up in chair eating dinner now  Delight OvensEdward B Joziah Dollins MD  [facility]eeper [phone] 09/01/2013 6:20 PM

## 2013-09-01 NOTE — Evaluation (Signed)
Physical Therapy Evaluation Patient Details Name: Jerry Graham MRN: [id number] DOB: December 06, 1950 Today's Date: 09/01/2013 Time: [id number] PT Time Calculation (min): 27 min  PT Assessment / Plan / Recommendation History of Present Illness  Adm 2/24 for CABG x 3; post-op confusion/delirium  PMHx-HTN, DM, CHF, Rt CVA  Clinical Impression  Patient is s/p CABG surgery resulting in functional limitations due to the deficits listed below (see PT Problem List). Pt currently limited by confusion and unable to fully assess his discharge plan. Will need to further investigate to determine if pt will have 24 hour assist on d/c.  Patient will benefit from skilled PT to increase their independence and safety with mobility to allow discharge to the venue listed below.       PT Assessment  Patient needs continued PT services    Follow Up Recommendations  SNF;Supervision/Assistance - 24 hour (per chart lives alone, pt too confused to provide history)    Does the patient have the potential to tolerate intense rehabilitation      Barriers to Discharge Decreased caregiver support unclear if anyone can provide assist    Equipment Recommendations  None recommended by PT    Recommendations for Other Services OT consult   Frequency Min 3X/week    Precautions / Restrictions Precautions Precautions: Fall;Sternal Restrictions Weight Bearing Restrictions: No   Pertinent Vitals/Pain Reported chest pain when coughing; otherwise no reports of pain; educated on use of pillow to splint chest SaO2 7-99% on 2L, HR 93-105      Mobility  Bed Mobility Overal bed mobility: Needs Assistance Bed Mobility: Supine to Sit Supine to sit: Min guard;HOB elevated General bed mobility comments: HOB fully elevated on arrival; pt unable to understand sternal precautions due to AMS, therefore kept HOB elevated as progressed to EOBj Transfers Overall transfer level: Needs assistance Equipment used: None Transfers: Sit  to/from Stand Sit to Stand: Min assist General transfer comment: steady assist as pt slightly staggering upon standing Ambulation/Gait Ambulation/Gait assistance: Min assist;+2 safety/equipment Ambulation Distance (Feet): 250 Feet Assistive device: None Gait Pattern/deviations: Step-through pattern;Decreased stride length;Decreased weight shift to right;Decreased dorsiflexion - left Gait velocity interpretation: at or above normal speed for age/gender    Exercises     PT Diagnosis: Difficulty walking;Acute pain  PT Problem List: Decreased activity tolerance;Decreased balance;Decreased mobility;Decreased cognition;Decreased knowledge of use of DME;Decreased safety awareness;Decreased knowledge of precautions;Pain PT Treatment Interventions: DME instruction;Gait training;Functional mobility training;Therapeutic activities;Cognitive remediation;Patient/family education     PT Goals(Current goals can be found in the care plan section) Acute Rehab PT Goals Patient Stated Goal: go home when able PT Goal Formulation: With patient Time For Goal Achievement: 09/08/13 Potential to Achieve Goals: Good  Visit Information  Last PT Received On: 09/01/13 Assistance Needed: +2 (lines) History of Present Illness: Adm 2/24 for CABG x 3; post-op confusion/delirium  PMHx-HTN, DM, CHF, Rt CVA       Prior Functioning  Home Living Family/patient expects to be discharged to:: Private residence Living Arrangements: Alone Additional Comments: Pt too confused to provide history; states he's "going to jail" after hospital, then later states he's going to rehab Prior Function Level of Independence: Independent Communication Communication: No difficulties    Cognition  Cognition Arousal/Alertness: Lethargic;Suspect due to medications (given haldol due to agitation earlier) Behavior During Therapy: [facility] for tasks assessed/performed Overall Cognitive Status: No family/caregiver present to determine  baseline cognitive functioning Memory: Decreased short-term memory (per history)    Extremity/Trunk Assessment Upper Extremity Assessment Upper Extremity Assessment: Overall WFL for tasks  assessed (within sternal precautions) Lower Extremity Assessment Lower Extremity Assessment: LLE deficits/detail LLE Deficits / Details: slightly drags LLE with ambulation as he fatigues Cervical / Trunk Assessment Cervical / Trunk Assessment: Normal   Balance Balance Overall balance assessment: Needs assistance Sitting-balance support: No upper extremity supported;Feet supported Sitting balance-Leahy Scale: Poor Sitting balance - Comments: leaning forward, closing eyes Standing balance support: No upper extremity supported Standing balance-Leahy Scale: Poor  End of Session PT - End of Session Equipment Utilized During Treatment: Gait belt;Oxygen Activity Tolerance: Patient limited by fatigue Patient left: in chair;with nursing/sitter in room Nurse Communication: Mobility status  GP     Jerry Graham 09/01/2013, 9:30 AM Pager [phone]

## 2013-09-01 NOTE — Progress Notes (Signed)
On the night of 08/31/2013, pt has been having increased confusion, with 5-6 attempts to get OOB without calling for assistance.  Pt at times states he is at his office other times states he is at the hospital but wants to go outside.  Reorientation worked at the beginning but as the night went on became less and less successful.  On a few attempts pt needed to get up to void with only two times being successful.  At 0530, pt became extremely confused, wanted to leave the hospital just to go outside.  Upon re-orientation, pt asked for "A smoke and a beer", when advising pt that we didn't have "a smoke or a beer", pt began to take EKG monitor off, BP cuff off, Pulse OX off, and began to walk out of the room.  Other staff members on the unit were able to convince pt to go back to his room.  During reorientation pt tried to pulled his R IJ Sleeve out.  RN prevented this and tried to explain to patient why.  Pt became combative (trying to push staff out of his way).  Versed 2mg  was an active order on pt's MAR, Versed 2mg  IV given.  Approx. 15-20 minutes later no changes.  Pt remains confused and combative.  E-link notified.  Dr. Kendrick FriesMcQuaid (E-link) ordered Haldol 5mg  IV, which was given and Bilat Wrists Restraints for pt safety, which were applied.  RN stayed in room to assess effectiveness.  Pt did briefly dose for 10 minutes, but then awake and confused but not combative.  RN will continue to observe patient.  Nickola MajorHans Charnita Trudel,RN,BSN,CCRN

## 2013-09-02 ENCOUNTER — Inpatient Hospital Stay (HOSPITAL_COMMUNITY): Payer: Medicaid Other

## 2013-09-02 LAB — GLUCOSE, CAPILLARY
Glucose-Capillary: 45 mg/dL — ABNORMAL LOW (ref 70–99)
Glucose-Capillary: 50 mg/dL — ABNORMAL LOW (ref 70–99)
Glucose-Capillary: 52 mg/dL — ABNORMAL LOW (ref 70–99)
Glucose-Capillary: 160 mg/dL — ABNORMAL HIGH (ref 70–99)
Glucose-Capillary: 239 mg/dL — ABNORMAL HIGH (ref 70–99)
Glucose-Capillary: 244 mg/dL — ABNORMAL HIGH (ref 70–99)
Glucose-Capillary: 143 mg/dL — ABNORMAL HIGH (ref 70–99)
Glucose-Capillary: 73 mg/dL (ref 70–99)
Glucose-Capillary: 77 mg/dL (ref 70–99)

## 2013-09-02 LAB — CBC
HCT: 31.2 % — ABNORMAL LOW (ref 39.0–52.0)
Hemoglobin: 10.8 g/dL — ABNORMAL LOW (ref 13.0–17.0)
MCH: 27.8 pg (ref 26.0–34.0)
MCHC: 34.6 g/dL (ref 30.0–36.0)
MCV: 80.4 fL (ref 78.0–100.0)
Platelets: 203 10*3/uL (ref 150–400)
RBC: 3.88 MIL/uL — ABNORMAL LOW (ref 4.22–5.81)
RDW: 14.2 % (ref 11.5–15.5)
WBC: 13.2 10*3/uL — ABNORMAL HIGH (ref 4.0–10.5)

## 2013-09-02 LAB — COMPREHENSIVE METABOLIC PANEL
ALT: 15 U/L (ref 0–53)
AST: 22 U/L (ref 0–37)
Albumin: 2.9 g/dL — ABNORMAL LOW (ref 3.5–5.2)
Alkaline Phosphatase: 64 U/L (ref 39–117)
BUN: 19 mg/dL (ref 6–23)
CO2: 29 mEq/L (ref 19–32)
Calcium: 8.6 mg/dL (ref 8.4–10.5)
Chloride: 103 mEq/L (ref 96–112)
Creatinine, Ser: 0.77 mg/dL (ref 0.50–1.35)
GFR calc Af Amer: >90 mL/min (ref >90)
GFR calc non Af Amer: >90 mL/min (ref >90)
Glucose, Bld: 58 mg/dL — ABNORMAL LOW (ref 70–99)
Potassium: 3.8 mEq/L (ref 3.7–5.3)
Sodium: 142 mEq/L (ref 137–147)
Total Bilirubin: 0.7 mg/dL (ref 0.3–1.2)
Total Protein: 6.4 g/dL (ref 6.0–8.3)

## 2013-09-02 MED ORDER — POTASSIUM CHLORIDE 10 MEQ/50ML IV SOLN
10.0000 meq | INTRAVENOUS | Status: AC
  Administered 2013-09-02 (×2): 10 meq via INTRAVENOUS
  Filled 2013-09-02 (×2): qty 50

## 2013-09-02 MED ORDER — CARVEDILOL 6.25 MG PO TABS
6.2500 mg | ORAL_TABLET | Freq: Two times a day (BID) | ORAL | Status: DC
  Administered 2013-09-02 – 2013-09-03 (×2): 6.25 mg via ORAL
  Filled 2013-09-02 (×4): qty 1

## 2013-09-02 MED ORDER — INSULIN DETEMIR 100 UNIT/ML ~~LOC~~ SOLN
20.0000 [IU] | Freq: Two times a day (BID) | SUBCUTANEOUS | Status: DC
  Administered 2013-09-02 – 2013-09-03 (×2): 20 [IU] via SUBCUTANEOUS
  Filled 2013-09-02 (×4): qty 0.2

## 2013-09-02 MED ORDER — DEXTROSE 50 % IV SOLN
INTRAVENOUS | Status: AC
  Administered 2013-09-02: 50 mL
  Filled 2013-09-02: qty 50

## 2013-09-02 MED ORDER — FUROSEMIDE 10 MG/ML IJ SOLN
40.0000 mg | Freq: Two times a day (BID) | INTRAMUSCULAR | Status: DC
  Administered 2013-09-02 – 2013-09-03 (×3): 40 mg via INTRAVENOUS
  Filled 2013-09-02 (×4): qty 4

## 2013-09-02 MED ORDER — DEXTROSE 50 % IV SOLN: 50.0000 mL | Freq: Once | INTRAVENOUS | Status: AC | PRN

## 2013-09-02 NOTE — Progress Notes (Signed)
UR Completed.  Jerry Graham [phone] 09/02/2013  

## 2013-09-02 NOTE — Progress Notes (Signed)
Patient ID: Jerry ArtistRobert Graham, male   DOB: 1950-11-26, 63 y.o.   MRN: [id number]  SICU Evening Rounds:  Hemodynamically stable  Diuresing well  Mental status improved.  Ambulated well.

## 2013-09-02 NOTE — Progress Notes (Signed)
PM CBGs 73 and 77. Per MD order, 2200 Levemir dose held. To continue sliding scale coverage. Will continue to monitor pt.

## 2013-09-02 NOTE — Progress Notes (Signed)
3 Days Post-Op Procedure(s) (LRB): CORONARY ARTERY BYPASS GRAFTING (CABG) (N/A) INTRAOPERATIVE TRANSESOPHAGEAL ECHOCARDIOGRAM (N/A) Subjective: CABG for CAD/LV dysfunction Severe COPD Currently sleeping this am- receiving small doses of versed/haldol for postop delirium had CVA last October  CXR with L base atelectasis/effusion NSR exrem warm  Objective: Vital signs in last 24 hours: Temp:  [97.7 F (36.5 C)-98.5 F (36.9 C)] 97.9 F (36.6 C) (02/27 0806) Pulse Rate:  [72-100] 86 (02/27 0900) Cardiac Rhythm:  [-] Normal sinus rhythm (02/27 0800) Resp:  [12-34] 16 (02/27 0900) BP: (82-186)/(60-105) 130/82 mmHg (02/27 0900) SpO2:  [88 %-100 %] 94 % (02/27 0900) Weight:  [176 lb 9.4 oz (80.1 kg)] 176 lb 9.4 oz (80.1 kg) (02/27 0500)  Hemodynamic parameters for last 24 hours:  stable  Intake/Output from previous day: 02/26 0701 - 02/27 0700 In: 890 [P.O.:840; IV Piggyback:50] Out: 1420 [Urine:1420] Intake/Output this shift: Total I/O In: 100 [IV Piggyback:100] Out: 600 [Urine:600]  EXAM Scattered rhonchi Neuro intact-w/o change  Lab Results:  Recent Labs  09/01/13 0530 09/02/13 0310  WBC 12.7* 13.2*  HGB 9.6* 10.8*  HCT 27.8* 31.2*  PLT 138* 203   BMET:  Recent Labs  09/01/13 0530 09/02/13 0310  NA 142 142  K 3.3* 3.8  CL 107 103  CO2 22 29  GLUCOSE 100* 58*  BUN 13 19  CREATININE 0.72 0.77  CALCIUM 6.6* 8.6    PT/INR:  Recent Labs  08/30/13 1330  LABPROT 16.0*  INR 1.31   ABG    Component Value Date/Time   PHART 7.347* 08/31/2013 0414   HCO3 24.9* 08/31/2013 0414   TCO2 26 08/31/2013 1818   ACIDBASEDEF 1.0 08/31/2013 0414   O2SAT 98.0 08/31/2013 0414   CBG (last 3)   Recent Labs  09/02/13 0417 09/02/13 0437 09/02/13 0802  GLUCAP 45* 160* 239*    Assessment/Plan: S/P Procedure(s) (LRB): CORONARY ARTERY BYPASS GRAFTING (CABG) (N/A) INTRAOPERATIVE TRANSESOPHAGEAL ECHOCARDIOGRAM (N/A)  diuresis ambulate Control DM with Levemir,  SSI Change lopressor back to coreg preop dose  LOS: 3 days    Jerry Graham,Jerry Graham 09/02/2013

## 2013-09-02 NOTE — Progress Notes (Signed)
Inpatient Diabetes Program Recommendations  AACE/ADA: New Consensus Statement on Inpatient Glycemic Control (2013)  Target Ranges:  Prepandial:   less than 140 mg/dL      Peak postprandial:   less than 180 mg/dL (1-2 hours)      Critically ill patients:  140 - 180 mg/dL   Reason for Visit: Note hypoglycemia overnight.  Results for Jerry Graham, [name] (MRN [id number]) as of 09/02/2013 14:22  Ref. Range 08/26/2013 16:03  Hemoglobin A1C Latest Range: <5.7 % 9.5 (H)   Levemir reduced to 20 units bid.   May also consider decreasing Novolog correction to moderate tid with meals and HS scale.  Patient may also benefit from the addition of Novolog meal coverage 4 units tid with meals.   Thanks,  Beryl MeagerJenny Sonya Gunnoe, RN, BC-ADM Inpatient Diabetes Coordinator Pager [phone]

## 2013-09-02 NOTE — Progress Notes (Addendum)
Pt cbg initial check at 0346 was 52, 15 minute recheck was 50 after 2 juice cups, 1 more juice and graham crackers given and re-checked blood sugar 45. 1 amp D50 given IV. Will re-check again in 15 minutes.     Follow up cbg 160

## 2013-09-02 NOTE — Progress Notes (Signed)
Speech Language Pathology Treatment: Dysphagia  Patient Details Name: Jadis Mika MRN: [id number] DOB: 1950-12-07 Today's Date: 09/02/2013 Time: 3428-7681 SLP Time Calculation (min): 17 min  Assessment / Plan / Recommendation Clinical Impression  Patient doing well, with no s/s of aspiration, and tolerating a regular diet with thin liquids.  Pt. Is able to verbalize and demonstrate aspiration precautions.  All goals met.  D/C ST.   HPI HPI: 63 year old male admitted 08/30/13 for CABG.  PMH significant for CVA, DM, CHF.  BSE ordered to evaluate swallow function and safety in light of dysphagia 03/2013 following CVA.   Pertinent Vitals LS diminished; afebrile  SLP Plan  Discharge SLP treatment due to (comment);All goals met    Recommendations Diet recommendations: Regular;Thin liquid Liquids provided via: Straw Medication Administration: Whole meds with liquid Supervision: Patient able to self feed Compensations: Slow rate;Small sips/bites Postural Changes and/or Swallow Maneuvers: Seated upright 90 degrees              Plan: Discharge SLP treatment due to (comment);All goals met    GO     Quinn Axe T 09/02/2013, 3:58 PM

## 2013-09-03 ENCOUNTER — Inpatient Hospital Stay (HOSPITAL_COMMUNITY): Payer: Medicaid Other

## 2013-09-03 LAB — CBC
HCT: 30.6 % — ABNORMAL LOW (ref 39.0–52.0)
Hemoglobin: 10.6 g/dL — ABNORMAL LOW (ref 13.0–17.0)
MCH: 28.1 pg (ref 26.0–34.0)
MCHC: 34.6 g/dL (ref 30.0–36.0)
MCV: 81.2 fL (ref 78.0–100.0)
Platelets: 251 10*3/uL (ref 150–400)
RBC: 3.77 MIL/uL — ABNORMAL LOW (ref 4.22–5.81)
RDW: 14.2 % (ref 11.5–15.5)
WBC: 11.3 10*3/uL — ABNORMAL HIGH (ref 4.0–10.5)

## 2013-09-03 LAB — GLUCOSE, CAPILLARY
Glucose-Capillary: 93 mg/dL (ref 70–99)
Glucose-Capillary: 159 mg/dL — ABNORMAL HIGH (ref 70–99)
Glucose-Capillary: 203 mg/dL — ABNORMAL HIGH (ref 70–99)
Glucose-Capillary: 201 mg/dL — ABNORMAL HIGH (ref 70–99)
Glucose-Capillary: 79 mg/dL (ref 70–99)

## 2013-09-03 LAB — BASIC METABOLIC PANEL
BUN: 19 mg/dL (ref 6–23)
CO2: 30 mEq/L (ref 19–32)
Calcium: 8.4 mg/dL (ref 8.4–10.5)
Chloride: 103 mEq/L (ref 96–112)
Creatinine, Ser: 0.87 mg/dL (ref 0.50–1.35)
GFR calc Af Amer: >90 mL/min (ref >90)
GFR calc non Af Amer: 89 mL/min — ABNORMAL LOW (ref >90)
Glucose, Bld: 97 mg/dL (ref 70–99)
Potassium: 4.1 mEq/L (ref 3.7–5.3)
Sodium: 144 mEq/L (ref 137–147)

## 2013-09-03 MED ORDER — MIDAZOLAM HCL 2 MG/2ML IJ SOLN
INTRAMUSCULAR | Status: AC
  Filled 2013-09-03: qty 2

## 2013-09-03 MED ORDER — HALOPERIDOL LACTATE 5 MG/ML IJ SOLN: 3.0000 mg | Freq: Four times a day (QID) | INTRAMUSCULAR | Status: DC | PRN

## 2013-09-03 MED ORDER — HYDRALAZINE HCL 20 MG/ML IJ SOLN
10.0000 mg | INTRAMUSCULAR | Status: DC | PRN
  Administered 2013-09-03: 10 mg via INTRAVENOUS

## 2013-09-03 MED ORDER — METFORMIN HCL 500 MG PO TABS
1000.0000 mg | ORAL_TABLET | Freq: Two times a day (BID) | ORAL | Status: DC
  Administered 2013-09-03 – 2013-09-07 (×8): 1000 mg via ORAL
  Filled 2013-09-03 (×10): qty 2

## 2013-09-03 MED ORDER — GLIPIZIDE 5 MG PO TABS
5.0000 mg | ORAL_TABLET | Freq: Every day | ORAL | Status: DC
  Administered 2013-09-03 – 2013-09-07 (×5): 5 mg via ORAL
  Filled 2013-09-03 (×6): qty 1

## 2013-09-03 MED ORDER — HALOPERIDOL LACTATE 5 MG/ML IJ SOLN: 5.0000 mg | Freq: Once | INTRAMUSCULAR | Status: DC

## 2013-09-03 MED ORDER — CARVEDILOL 12.5 MG PO TABS
12.5000 mg | ORAL_TABLET | Freq: Two times a day (BID) | ORAL | Status: DC
  Administered 2013-09-03 – 2013-09-06 (×6): 12.5 mg via ORAL
  Filled 2013-09-03 (×8): qty 1

## 2013-09-03 MED ORDER — HYDRALAZINE HCL 20 MG/ML IJ SOLN
INTRAMUSCULAR | Status: AC
  Filled 2013-09-03: qty 1

## 2013-09-03 MED ORDER — HALOPERIDOL LACTATE 5 MG/ML IJ SOLN
3.0000 mg | Freq: Once | INTRAMUSCULAR | Status: AC
  Administered 2013-09-03: 3 mg via INTRAVENOUS

## 2013-09-03 MED ORDER — HALOPERIDOL LACTATE 5 MG/ML IJ SOLN
2.0000 mg | Freq: Four times a day (QID) | INTRAMUSCULAR | Status: DC | PRN
  Filled 2013-09-03: qty 0.4
  Filled 2013-09-03: qty 1

## 2013-09-03 MED ORDER — LISINOPRIL 20 MG PO TABS
20.0000 mg | ORAL_TABLET | Freq: Every day | ORAL | Status: DC
  Administered 2013-09-04 – 2013-09-07 (×4): 20 mg via ORAL
  Filled 2013-09-03 (×4): qty 1

## 2013-09-03 NOTE — Progress Notes (Signed)
Clinical Social Work Department BRIEF PSYCHOSOCIAL ASSESSMENT 09/03/2013  Patient:  Jerry Graham,Jerry Graham     Account Number:  [id number]     Admit date:  08/30/2013  Clinical Social Worker:  Hendricks MiloMORGAN,Danniell Rotundo, LCSWA  Date/Time:  09/03/2013 11:33 AM  Referred by:  Physician  Date Referred:  09/02/2013 Referred for  SNF Placement   Other Referral:   Interview type:  Family Other interview type:    PSYCHOSOCIAL DATA Living Status:  ALONE Admitted from facility:   Level of care:   Primary support name:  Marcell AngerBeverly Etienne [phone] Primary support relationship to patient:  SIBLING Degree of support available:   Good support, however sister lives in [place] and is only in [place] temporarily.    CURRENT CONCERNS  Other Concerns:    SOCIAL WORK ASSESSMENT / PLAN Clinical Social Worker (CSW) contacted patient's sister Marcell AngerBeverly Etienne at [phone]. [name] reported that she and her sister Elmore Guiseamela Averitt-Brown [phone] are the primary decision makers for the patient. [name] reported that there is no HPOA paper work in place. [name] reported that patient lives alone in [place] and the majority [place]. [name] reported that she and her sister live in [place] and are only in [place] for 10 days. [name] stated that [facility] would [facility]. CSW asked RN to ask MD to request a CIR consult. CSW explained to [place]everly that patient has medicaid as his primary payer source and that we are going to have to extend the search to surrounding counties to find a medicaid bed. [name] verbalized her understanding and inquired about how to apply for medicare. CSW encouraged [name] to look at [employer] and CSW also left message with financial counselor for assistance with medicare.   Assessment/plan status:  Psychosocial Support/Ongoing Assessment of Needs Other assessment/ plan:   Information/referral to community resources:     PATIENT'S/FAMILY'S RESPONSE TO PLAN OF CARE: Patient's sister [name] thanked CSW for calling and explaining placement process. [name] reported that she is concerned about finding a medicaid bed and doesn't think that patient can manage on his own at home. CSW provided emotional support and told sister that we will work on a safe D/C plan for patient.

## 2013-09-03 NOTE — Progress Notes (Signed)
Clinical Social Work Department CLINICAL SOCIAL WORK PLACEMENT NOTE 09/03/2013  Patient:  Jerry Graham,Jerry Graham  Account Number:  [id number] Admit date:  08/30/2013  Clinical Social Worker:  Jetta LoutBAILEY MORGAN, Theresia MajorsLCSWA  Date/time:  09/03/2013 11:51 AM  Clinical Social Work is seeking post-discharge placement for this patient at the following level of care:   SKILLED NURSING   (*CSW will update this form in Epic as items are completed)   09/03/2013  Patient/family provided with [facility] Department of Clinical Social Work's list of facilities offering this level of care within the geographic area requested by the patient (or if unable, by the patient's family).  09/03/2013  Patient/family informed of their freedom to choose among providers that offer the needed level of care, that participate in Medicare, Medicaid or managed care program needed by the patient, have an available bed and are willing to accept the patient.  09/03/2013  Patient/family informed of [facility]' ownership interest in [facility], as well as of the fact that they are under no obligation to receive care at this facility.  PASARR submitted to EDS on 04/01/2013 PASARR number received from EDS on 04/01/2013  FL2 transmitted to all facilities in geographic area requested by pt/family on  09/03/2013 FL2 transmitted to all facilities within larger geographic area on   Patient informed that his/her managed care company has contracts with or will negotiate with  certain facilities, including the following:     Patient/family informed of bed offers received:   Patient chooses bed at  Physician recommends and patient chooses bed at    Patient to be transferred to  on   Patient to be transferred to facility by   The following physician request were entered in Epic:   Additional Comments: Patient has an existing PASARR, however when using last name, first name, SSN, and DOB it cannot be looked up in [place].

## 2013-09-03 NOTE — Progress Notes (Signed)
Pt became very disoriented and combative. Attempting to get out of bed and pull at lines and monitors requiring 5 staff members to maintain pt safety. He became angry and hostile with staff. 2 mg prn Haldol administered along with 50 mg of Ultram. Pt remained combative and Dr. Laneta SimmersBartle was notified. Additional 3 mg Haldol ordered and administered. 4 pt restraints applied. Pt is calm at present time. Will continue to monitor pt.

## 2013-09-03 NOTE — Progress Notes (Signed)
SBP sustaining 160s-170s. 2 doses of 5 mg Lopressor administered. Very brief decrease in SBP to 150s resulting from Lopressor. MD notified. Orders received for 10 mg Hydralazine q4hr prn for SBP >150. First dose of Hydralazine administered. Will continue to monitor pt.

## 2013-09-03 NOTE — Progress Notes (Signed)
4 Days Post-Op Procedure(s) (LRB): CORONARY ARTERY BYPASS GRAFTING (CABG) (N/A) INTRAOPERATIVE TRANSESOPHAGEAL ECHOCARDIOGRAM (N/A) Subjective:  No complaints  Remains impulsive and tries to get up on his own. Has a Comptrollersitter.  Objective: Vital signs in last 24 hours: Temp:  [97.9 F (36.6 C)-98.7 F (37.1 C)] 98.2 F (36.8 C) (02/28 0751) Pulse Rate:  [92-124] 105 (02/28 1000) Cardiac Rhythm:  [-] Normal sinus rhythm;Sinus tachycardia (02/28 0800) Resp:  [15-31] 20 (02/28 1000) BP: (106-176)/(59-97) 131/70 mmHg (02/28 1000) SpO2:  [93 %-100 %] 94 % (02/28 1000) Weight:  [78.8 kg (173 lb 11.6 oz)] 78.8 kg (173 lb 11.6 oz) (02/28 0500)  Hemodynamic parameters for last 24 hours:    Intake/Output from previous day: 02/27 0701 - 02/28 0700 In: 720 [P.O.:600; I.V.:20; IV Piggyback:100] Out: 3525 [Urine:3525] Intake/Output this shift: Total I/O In: 440 [P.O.:360; I.V.:80] Out: 1575 [Urine:1575]  General appearance: alert and cooperative Neurologic: intact Heart: regular rate and rhythm, S1, S2 normal, no murmur, click, rub or gallop Lungs: diminished breath sounds LLL Extremities: extremities normal, atraumatic, no cyanosis or edema Wound: incision ok  Lab Results:  Recent Labs  09/02/13 0310 09/03/13 0408  WBC 13.2* 11.3*  HGB 10.8* 10.6*  HCT 31.2* 30.6*  PLT 203 251   BMET:  Recent Labs  09/02/13 0310 09/03/13 0408  NA 142 144  Graham 3.8 4.1  CL 103 103  CO2 29 30  GLUCOSE 58* 97  BUN 19 19  CREATININE 0.77 0.87  CALCIUM 8.6 8.4    PT/INR: No results found for this basename: LABPROT, INR,  in the last 72 hours ABG    Component Value Date/Time   PHART 7.347* 08/31/2013 0414   HCO3 24.9* 08/31/2013 0414   TCO2 26 08/31/2013 1818   ACIDBASEDEF 1.0 08/31/2013 0414   O2SAT 98.0 08/31/2013 0414   CBG (last 3)   Recent Labs  09/02/13 2204 09/03/13 0427 09/03/13 0749  GLUCAP 77 93 159*   CLINICAL DATA: Post CABG  EXAM:  CHEST 2 VIEW  COMPARISON: DG  CHEST 1V PORT dated 09/02/2013; DG CHEST 2 VIEW dated  08/26/2013; DG CHEST 2 VIEW dated 03/31/2013; DG CHEST 2 VIEW dated  03/30/2013  FINDINGS:  Grossly unchanged enlarged cardiac silhouette and mediastinal  contours post median sternotomy and CABG. Stable positioning of  support apparatus. Unchanged small left-sided pleural effusion with  associated left basilar heterogeneous / consolidative opacities.  Improved aeration of the right lung with persistent trace  right-sided effusion. No new focal airspace opacities. No evidence  of edema. No pneumothorax. Unchanged bones.  IMPRESSION:  Improved aeration of the lungs with persistent trace right and small  left-sided effusions with associated left basilar atelectasis.  Electronically Signed  By: Simonne ComeJohn Watts M.D.  On: 09/03/2013 10:26   Assessment/Plan: S/P Procedure(s) (LRB): CORONARY ARTERY BYPASS GRAFTING (CABG) (N/A) INTRAOPERATIVE TRANSESOPHAGEAL ECHOCARDIOGRAM (N/A) He was hypertensive overnight and given some hydralazine. He has a resting sinus tachycardia so will increase his Coreg. Increase Lisinopril to home dose. Mobilize Diabetes control: Will resume oral meds. His Hgb A1C was 9.5 preop so he probably needs dietary changes and possibly increase in meds. Will increase Metformin to 1000 bid. Postop delirium: improved but not completely resolved. Still needs close observation to prevent him from getting up on his own.   LOS: 4 days    Jerry Graham,Jerry Graham 09/03/2013

## 2013-09-04 LAB — GLUCOSE, CAPILLARY
Glucose-Capillary: 124 mg/dL — ABNORMAL HIGH (ref 70–99)
Glucose-Capillary: 129 mg/dL — ABNORMAL HIGH (ref 70–99)
Glucose-Capillary: 90 mg/dL (ref 70–99)
Glucose-Capillary: 107 mg/dL — ABNORMAL HIGH (ref 70–99)
Glucose-Capillary: 180 mg/dL — ABNORMAL HIGH (ref 70–99)
Glucose-Capillary: 156 mg/dL — ABNORMAL HIGH (ref 70–99)
Glucose-Capillary: 169 mg/dL — ABNORMAL HIGH (ref 70–99)

## 2013-09-04 MED ORDER — ONDANSETRON HCL 4 MG PO TABS: 4.0000 mg | ORAL_TABLET | Freq: Four times a day (QID) | ORAL | Status: DC | PRN

## 2013-09-04 MED ORDER — ASPIRIN EC 325 MG PO TBEC
325.0000 mg | DELAYED_RELEASE_TABLET | Freq: Every day | ORAL | Status: DC
  Administered 2013-09-05 – 2013-09-07 (×3): 325 mg via ORAL
  Filled 2013-09-04 (×3): qty 1

## 2013-09-04 MED ORDER — LORAZEPAM 1 MG PO TABS: 1.0000 mg | ORAL_TABLET | Freq: Every evening | ORAL | Status: DC | PRN

## 2013-09-04 MED ORDER — MOVING RIGHT ALONG BOOK
Freq: Once | Status: AC
  Administered 2013-09-04: 14:00:00
  Filled 2013-09-04: qty 1

## 2013-09-04 MED ORDER — BISACODYL 5 MG PO TBEC: 10.0000 mg | DELAYED_RELEASE_TABLET | Freq: Every day | ORAL | Status: DC | PRN

## 2013-09-04 MED ORDER — ONDANSETRON HCL 4 MG/2ML IJ SOLN
4.0000 mg | Freq: Four times a day (QID) | INTRAMUSCULAR | Status: DC | PRN
Start: 2013-09-04 — End: 2013-09-07

## 2013-09-04 MED ORDER — SODIUM CHLORIDE 0.9 % IV SOLN: 250.0000 mL | INTRAVENOUS | Status: DC | PRN

## 2013-09-04 MED ORDER — ACETAMINOPHEN 325 MG PO TABS: 650.0000 mg | ORAL_TABLET | Freq: Four times a day (QID) | ORAL | Status: DC | PRN

## 2013-09-04 MED ORDER — SODIUM CHLORIDE 0.9 % IJ SOLN: 3.0000 mL | INTRAMUSCULAR | Status: DC | PRN

## 2013-09-04 MED ORDER — DOCUSATE SODIUM 100 MG PO CAPS
200.0000 mg | ORAL_CAPSULE | Freq: Every day | ORAL | Status: DC
  Administered 2013-09-05 – 2013-09-06 (×2): 200 mg via ORAL
  Filled 2013-09-04 (×3): qty 2

## 2013-09-04 MED ORDER — SODIUM CHLORIDE 0.9 % IJ SOLN
3.0000 mL | Freq: Two times a day (BID) | INTRAMUSCULAR | Status: DC
Start: 2013-09-04 — End: 2013-09-07
  Administered 2013-09-04 – 2013-09-06 (×5): 3 mL via INTRAVENOUS

## 2013-09-04 MED ORDER — BISACODYL 10 MG RE SUPP: 10.0000 mg | Freq: Every day | RECTAL | Status: DC | PRN

## 2013-09-04 MED ORDER — TRAMADOL HCL 50 MG PO TABS: 50.0000 mg | ORAL_TABLET | ORAL | Status: DC | PRN

## 2013-09-04 MED ORDER — FAMOTIDINE 20 MG PO TABS
20.0000 mg | ORAL_TABLET | Freq: Two times a day (BID) | ORAL | Status: DC
  Administered 2013-09-04 – 2013-09-07 (×6): 20 mg via ORAL
  Filled 2013-09-04 (×8): qty 1

## 2013-09-04 MED ORDER — INSULIN ASPART 100 UNIT/ML ~~LOC~~ SOLN
0.0000 [IU] | Freq: Three times a day (TID) | SUBCUTANEOUS | Status: DC
  Administered 2013-09-04: 4 [IU] via SUBCUTANEOUS
  Administered 2013-09-05: 2 [IU] via SUBCUTANEOUS
  Administered 2013-09-05 – 2013-09-06 (×3): 4 [IU] via SUBCUTANEOUS
  Administered 2013-09-06: 2 [IU] via SUBCUTANEOUS
  Administered 2013-09-07: 4 [IU] via SUBCUTANEOUS
  Administered 2013-09-07: 2 [IU] via SUBCUTANEOUS

## 2013-09-04 NOTE — Progress Notes (Signed)
Patient was transferred via ambulation and also transport in a wheelchair to 2 west room 26.  All patients belongings, medical record, and patient meds were transferred with the patient.  At the time of transfer the patients was alert, oriented and VSS.  Patient left with Delaney Meigsamara, RN monitoring patient.  Tommi EmeryHINTZ, Layken Beg M

## 2013-09-04 NOTE — Progress Notes (Signed)
5 Days Post-Op Procedure(s) (LRB): CORONARY ARTERY BYPASS GRAFTING (CABG) (N/A) INTRAOPERATIVE TRANSESOPHAGEAL ECHOCARDIOGRAM (N/A) Subjective:  No complaints  He was agitated last night and required some Haldol but seems fine this am.  Objective: Vital signs in last 24 hours: Temp:  [97.4 F (36.3 C)-99.1 F (37.3 C)] 97.9 F (36.6 C) (03/01 1215) Pulse Rate:  [92-119] 92 (03/01 1000) Cardiac Rhythm:  [-] Normal sinus rhythm (03/01 0800) Resp:  [13-24] 15 (03/01 1000) BP: (105-150)/(55-108) 107/71 mmHg (03/01 1000) SpO2:  [93 %-99 %] 95 % (03/01 1000) Weight:  [76.8 kg (169 lb 5 oz)] 76.8 kg (169 lb 5 oz) (03/01 0400)  Hemodynamic parameters for last 24 hours:    Intake/Output from previous day: 02/28 0701 - 03/01 0700 In: 960 [P.O.:780; I.V.:180] Out: 1925 [Urine:1925] Intake/Output this shift: Total I/O In: 300 [P.O.:300] Out: -   General appearance: alert and cooperative Neurologic: intact Heart: regular rate and rhythm, S1, S2 normal, no murmur, click, rub or gallop Lungs: clear to auscultation bilaterally Extremities: extremities normal, atraumatic, no cyanosis or edema Wound: incision ok  Lab Results:  Recent Labs  09/02/13 0310 09/03/13 0408  WBC 13.2* 11.3*  HGB 10.8* 10.6*  HCT 31.2* 30.6*  PLT 203 251   BMET:  Recent Labs  09/02/13 0310 09/03/13 0408  NA 142 144  K 3.8 4.1  CL 103 103  CO2 29 30  GLUCOSE 58* 97  BUN 19 19  CREATININE 0.77 0.87  CALCIUM 8.6 8.4    PT/INR: No results found for this basename: LABPROT, INR,  in the last 72 hours ABG    Component Value Date/Time   PHART 7.347* 08/31/2013 0414   HCO3 24.9* 08/31/2013 0414   TCO2 26 08/31/2013 1818   ACIDBASEDEF 1.0 08/31/2013 0414   O2SAT 98.0 08/31/2013 0414   CBG (last 3)   Recent Labs  09/04/13 0353 09/04/13 0815 09/04/13 1214  GLUCAP 124* 107* 180*    Assessment/Plan: S/P Procedure(s) (LRB): CORONARY ARTERY BYPASS GRAFTING (CABG) (N/A) INTRAOPERATIVE  TRANSESOPHAGEAL ECHOCARDIOGRAM (N/A) He is doing well overall. He seems to sun down every evening but is fine now. Will transfer to 2W close to nursing station. Mobilize Diabetes control Plan for transfer to step-down: see transfer orders   LOS: 5 days    Jerry Graham K 09/04/2013

## 2013-09-05 ENCOUNTER — Inpatient Hospital Stay (HOSPITAL_COMMUNITY): Payer: Medicaid Other

## 2013-09-05 ENCOUNTER — Ambulatory Visit: Payer: Self-pay | Admitting: Cardiovascular Disease

## 2013-09-05 LAB — GLUCOSE, CAPILLARY
Glucose-Capillary: 154 mg/dL — ABNORMAL HIGH (ref 70–99)
Glucose-Capillary: 182 mg/dL — ABNORMAL HIGH (ref 70–99)
Glucose-Capillary: 167 mg/dL — ABNORMAL HIGH (ref 70–99)
Glucose-Capillary: 116 mg/dL — ABNORMAL HIGH (ref 70–99)

## 2013-09-05 NOTE — Progress Notes (Signed)
UR Completed.  Jerry Graham Jane [phone] 09/05/2013  

## 2013-09-05 NOTE — Progress Notes (Signed)
CARDIAC REHAB PHASE I   PRE:  Rate/Rhythm: 93  BP:  Supine  Sitting: 120/80  Standing:    SaO2: 94 RA  MODE:  Ambulation: 600 ft   POST:  Rate/Rhythem: 113  BP:  Supine:   Sitting: 134/68  Standing:    SaO2:93RA  10:05-10:35  Ambulated with assist x 1 using the rolling walker.  Pace good, he did tire and was a little unsteady the last 50 feet of walk.  Stated he felt a little lightheaded.  Oxygen sat was 93 and heart rate 113.  Returned to bedside chair, call bell in place and nursing assistant with patient.  He seemed appropriate for most of conversation while walking.  States he has had trouble with memory since stroke.  Cathie OldenPhyllis  [name] RN  [name], Lavon PaganiniMary [name]

## 2013-09-05 NOTE — Progress Notes (Signed)
       [address]       [place] [id number]             [phone]          6 Days Post-Op Procedure(s) (LRB): CORONARY ARTERY BYPASS GRAFTING (CABG) (N/A) INTRAOPERATIVE TRANSESOPHAGEAL ECHOCARDIOGRAM (N/A)  Subjective: +nonproductive cough with chest soreness, otherwise feeling well.   Objective: Vital signs in last 24 hours: Patient Vitals for the past 24 hrs:  BP Temp Temp src Pulse Resp SpO2 Weight  09/05/13 0457 129/67 mmHg 97.2 F (36.2 C) Oral 89 18 92 % 169 lb 14.4 oz (77.066 kg)  09/04/13 2045 - - - - - 93 % -  09/04/13 2024 132/78 mmHg 98.5 F (36.9 C) Oral 89 17 95 % -  09/04/13 1754 109/88 mmHg 97.8 F (36.6 C) Oral 95 16 98 % -  09/04/13 1704 119/74 mmHg - - 89 - - -  09/04/13 1607 - - - - - 100 % -  09/04/13 1546 - 98.2 F (36.8 C) Oral - - - -  09/04/13 1500 110/61 mmHg - - 88 14 95 % -  09/04/13 1400 - - - 88 14 95 % -  09/04/13 1300 - - - 99 19 97 % -  09/04/13 1215 - 97.9 F (36.6 C) Oral - - - -  09/04/13 1200 111/62 mmHg - - 87 21 95 % -  09/04/13 1100 101/54 mmHg - - 89 14 95 % -  09/04/13 1000 107/71 mmHg - - 92 15 95 % -  09/04/13 0947 113/57 mmHg - - - - - -  09/04/13 0900 105/70 mmHg - - 95 14 93 % -  09/04/13 0827 - - - - - 96 % -  09/04/13 0821 126/66 mmHg - - 96 - - -  09/04/13 0817 - 99.1 F (37.3 C) Oral - - - -   Current Weight  09/05/13 169 lb 14.4 oz (77.066 kg)  PRE-OPERATIVE WEIGHT: 79kg    Intake/Output from previous day: 03/01 0701 - 03/02 0700 In: 1100 [P.O.:1100] Out: 352 [Urine:350; Stool:2]  CBGs [id number]    PHYSICAL EXAM:  Heart: RRR Lungs: Clear Wound: Clean and dry Extremities: No LE edema    Lab Results: CBC: Recent Labs  09/03/13 0408  WBC 11.3*  HGB 10.6*  HCT 30.6*  PLT 251   BMET:  Recent Labs  09/03/13 0408  NA 144  K 4.1  CL 103  CO2 30  GLUCOSE 97  BUN 19  CREATININE 0.87  CALCIUM 8.4    PT/INR: No results found for this basename: LABPROT, INR,  in  the last 72 hours    Assessment/Plan: S/P Procedure(s) (LRB): CORONARY ARTERY BYPASS GRAFTING (CABG) (N/A) INTRAOPERATIVE TRANSESOPHAGEAL ECHOCARDIOGRAM (N/A)  CV- BPs stable, maintaining SR.  Continue current meds.  DM- sugars generally stable. Continue Metformin, Glucotrol. A1C= 9.5  Delirium- MS stable, did well last night with sitter. Continue to monitor.  CRPI, pulm toilet.  Disp- will need SNF- hopefully ready to d/c in next few days.   LOS: 6 days    Jerry Graham H 09/05/2013

## 2013-09-05 NOTE — Progress Notes (Signed)
CSW spoke to patient's family members outside the room about a SNF placement. Patient's family states they [place] a facility in [place] and provided CSW with contact information. Patient's family also states that patient now as of March, 1, 2015 has [employer] as well as Medicaid. Patient's family did not have letter with them, but provided social worker with policy number. CSW has contacted the [place] facility and has left voicemails. CSW also left voicemail with financial counselor to confirm [employer] insurance.  Maree KrabbeLindsay Nolberto Cheuvront, MSW, Theresia MajorsLCSWA [phone]

## 2013-09-06 ENCOUNTER — Inpatient Hospital Stay (HOSPITAL_COMMUNITY): Payer: Medicaid Other

## 2013-09-06 DIAGNOSIS — R000 Tachycardia, unspecified: Secondary | ICD-10-CM | POA: Diagnosis not present

## 2013-09-06 DIAGNOSIS — I472 Ventricular tachycardia: Secondary | ICD-10-CM

## 2013-09-06 DIAGNOSIS — I4729 Other ventricular tachycardia: Secondary | ICD-10-CM

## 2013-09-06 DIAGNOSIS — I4891 Unspecified atrial fibrillation: Secondary | ICD-10-CM | POA: Diagnosis not present

## 2013-09-06 LAB — GLUCOSE, CAPILLARY
Glucose-Capillary: 144 mg/dL — ABNORMAL HIGH (ref 70–99)
Glucose-Capillary: 177 mg/dL — ABNORMAL HIGH (ref 70–99)
Glucose-Capillary: 119 mg/dL — ABNORMAL HIGH (ref 70–99)
Glucose-Capillary: 119 mg/dL — ABNORMAL HIGH (ref 70–99)

## 2013-09-06 MED ORDER — CARVEDILOL 25 MG PO TABS
25.0000 mg | ORAL_TABLET | Freq: Two times a day (BID) | ORAL | Status: DC
  Administered 2013-09-06 – 2013-09-07 (×3): 25 mg via ORAL
  Filled 2013-09-06 (×4): qty 1

## 2013-09-06 MED ORDER — METOPROLOL TARTRATE 1 MG/ML IV SOLN
INTRAVENOUS | Status: AC
  Administered 2013-09-06: 5 mg
  Filled 2013-09-06: qty 5

## 2013-09-06 MED ORDER — METOPROLOL TARTRATE 1 MG/ML IV SOLN
5.0000 mg | Freq: Once | INTRAVENOUS | Status: AC
  Administered 2013-09-06: 5 mg via INTRAVENOUS

## 2013-09-06 NOTE — Procedures (Signed)
Successful US guided left thoracentesis. Yielded 400 ml of bloody fluid. Pt tolerated procedure well. No immediate complications.  Specimen was not sent for labs. CXR ordered.  Pattricia BossMORGAN, Jazzmen Restivo D PA-C 09/06/2013 3:07 PM

## 2013-09-06 NOTE — Progress Notes (Signed)
CARDIAC REHAB PHASE I   PRE:  Rate/Rhythm: 102 St with PVC's  BP:  Supine:   Sitting: 118/70  Standing:    SaO2: 95 RA  MODE:  Ambulation: 550 ft   POST:  Rate/Rhythm: 98  BP:  Supine:   Sitting: 128/80  Standing:    SaO2: 95 RA 1340-1410 Assisted X 1 and used rollater to ambulate. Gait steady with rollater. Pt able to walk 550 feet without c/o. VS stable. Pt back to recliner after walk with call light in reach and chair alarm on.  Melina CopaLisa Zachary Nole RN 09/06/2013 2:12 PM

## 2013-09-06 NOTE — Consult Note (Signed)
Admit date: 08/30/2013 Referring Physician  Dr. Donata Clay Primary Physician  Dr. Hassie Bruce Primary Cardiologist  Dr. Elease Hashimoto Reason for Consultation  arrhythmia  HPI: 63 year old hypertensive African American male smokerwith  three-vessel CAD with EF 35% s/p recent CABG. Patient had a CVA in October of last year and now has recovered fairly well. He has diabetes and has been diabetic control has improved with significant efforts from his primary care provider--last A1c 8.5. He is reduced her smoking significantly to less than half a pack a day. He does not drink alcohol.  He underwent CABG with LIMA to LAD, SVG to OM1 and SVG to OM2 on 08/30/2013.  Postop had some sinus tachycardia and metoprolol was uptitrated.  He has had a slow recovery with significant sun downing and agitation.  No post op afib noted.  This am had wide complex tachycardia that is irregular spontaneously terminated but had several runs.  Currently in NSR.  He was asymptomatic at the time.  Cardiology is now asked to consult.  Patient deneis any chest pain except with cough.  He denies any SOB, LE edema, dizziness or palpitations.      PMH:   Past Medical History  Diagnosis Date  . Hypertension   . Diabetes mellitus without complication   . Stroke 2013, 2014  . Hyperlipidemia   . Tobacco abuse   . CHF (congestive heart failure)   . Short-term memory loss     from stroke     PSH:   Past Surgical History  Procedure Laterality Date  . Lung biopsy      years ago  . Tee without cardioversion N/A 04/27/2013    Procedure: TRANSESOPHAGEAL ECHOCARDIOGRAM (TEE);  Surgeon: Vesta Mixer, MD;  Location: [facility] ENDOSCOPY;  Service: Cardiovascular;  Laterality: N/A;  . Coronary artery bypass graft N/A 08/30/2013    Procedure: CORONARY ARTERY BYPASS GRAFTING (CABG);  Surgeon: Kerin Perna, MD;  Location: [facility] OR;  Service: Open Heart Surgery;  Laterality: N/A;  CABG x  3 , using left internal mammary artery and right leg greater saphenous  vein harvested endoscopically  . Intraoperative transesophageal echocardiogram N/A 08/30/2013    Procedure: INTRAOPERATIVE TRANSESOPHAGEAL ECHOCARDIOGRAM;  Surgeon: Kerin Perna, MD;  Location: [facility] OR;  Service: Open Heart Surgery;  Laterality: N/A;    Allergies:  Review of patient's allergies indicates no known allergies. Prior to Admit Meds:   Prescriptions prior to admission  Medication Sig Dispense Refill  . aspirin 325 MG tablet Take 1 tablet (325 mg total) by mouth daily.  30 tablet  11  . atorvastatin (LIPITOR) 80 MG tablet Take 1 tablet (80 mg total) by mouth daily at 6 PM.  30 tablet  11  . carvedilol (COREG) 12.5 MG tablet Take 6.25 mg by mouth 2 (two) times daily with a meal.       . glipiZIDE (GLUCOTROL) 5 MG tablet Take 1 tablet (5 mg total) by mouth daily before breakfast.  60 tablet  9  . lisinopril (PRINIVIL,ZESTRIL) 20 MG tablet Take 1 tablet (20 mg total) by mouth daily.  30 tablet  11  . metFORMIN (GLUCOPHAGE) 500 MG tablet Take 1 tablet (500 mg total) by mouth 2 (two) times daily with a meal.  60 tablet  11   Fam HX:    Family History  Problem Relation Age of Onset  . Scleroderma Mother     Living, 63  . Heart disease Mother   . Diabetes Father     Living,  89  . Hypertension Father   . Transient ischemic attack Father   . Hypertension Sister   . Hyperlipidemia Sister    Social HX:    History   Social History  . Marital Status: Single    Spouse Name: N/A    Number of Children: 0  . Years of Education: 14   Occupational History  . Not on file.   Social History Main Topics  . Smoking status: Current Every Day Smoker -- 0.25 packs/day for 50 years    Types: Cigarettes  . Smokeless tobacco: Not on file     Comment: currently smokes 1/5 ppd  . Alcohol Use: No  . Drug Use: No  . Sexual Activity: Not on file   Other Topics Concern  . Not on file   Social History Narrative   Mr. Bachmeier grew up in [place], [place]. He attended [employer] for 2  years but did not graduate. He is single and does not have any children. He recently moved to [place] 7 months ago. He moved to the area for financial reasons. 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 Lives alone in [place]. Retired Music therapist. He enjoys exercising. Enjoys walking. He also enjoys watching TV. He really loves to dance but has found there is no where to go dancing in [place].     ROS:  All 11 ROS were addressed and are negative except what is stated in the HPI  Physical Exam: Blood pressure 153/83, pulse 92, temperature 98.3 F (36.8 C), temperature source Oral, resp. rate 18, height 5\' 10"  (1.778 m), weight 169 lb 8.5 oz (76.9 kg), SpO2 92.00%.    General: Well developed, well nourished, in no acute distress Head: Eyes PERRLA, No xanthomas.   Normal cephalic and atramatic  Lungs:   Clear bilaterally to auscultation and percussion. Heart:   HRRR S1 S2 Pulses are 2+ & equal.            No carotid bruit. No JVD.  No abdominal bruits. No femoral bruits. Abdomen: Bowel sounds are positive, abdomen soft and non-tender without masses  Extremities:   No clubbing, cyanosis or edema.  DP +1 Neuro: Alert and oriented X 3. Psych:  Good affect, responds appropriately    Labs:   Lab Results  Component Value Date   WBC 11.3* 09/03/2013   HGB 10.6* 09/03/2013   HCT 30.6* 09/03/2013   MCV 81.2 09/03/2013   PLT 251 09/03/2013    Recent Labs Lab 09/02/13 0310 09/03/13 0408  NA 142 144  K 3.8 4.1  CL 103 103  CO2 29 30  BUN 19 19  CREATININE 0.77 0.87  CALCIUM 8.6 8.4  PROT 6.4  --   BILITOT 0.7  --   ALKPHOS 64  --   ALT 15  --   AST 22  --   GLUCOSE 58* 97   No results found for this basename: PTT   Lab Results  Component Value Date   INR 1.31 08/30/2013   INR 1.02 08/26/2013   INR 1.1 06/13/2013   No results found for this basename: CKTOTAL, CKMB, CKMBINDEX, TROPONINI     Lab Results  Component Value Date   CHOL 275* 03/31/2013     Lab Results  Component Value Date   HDL 38* 03/31/2013   Lab Results  Component Value Date   LDLCALC 197* 03/31/2013   Lab Results  Component Value Date   TRIG 202* 03/31/2013  Lab Results  Component Value Date   CHOLHDL 7.2 03/31/2013   No results found for this basename: LDLDIRECT      Radiology:  Dg Chest 2 View  09/05/2013   CLINICAL DATA:  Left pleural effusion  EXAM: CHEST  2 VIEW  COMPARISON:  DG CHEST LEFT DECUBITUS dated 09/05/2013; DG CHEST 2 VIEW dated 09/03/2013  FINDINGS: Moderate left pleural effusion again noted, unchanged since prior study. Left basilar opacity again noted, stable, likely atelectasis. Right lung is clear.  Prior CABG.  Mild cardiomegaly.  No acute bony abnormality.  IMPRESSION: Stable moderate left pleural effusion with left base atelectasis.   Electronically Signed   By: Charlett NoseKevin  Dover M.D.   On: 09/05/2013 09:15   Dg Chest Left Decubitus  09/05/2013   CLINICAL DATA:  Left pleural effusion.  EXAM: CHEST - LEFT DECUBITUS  COMPARISON:  09/03/2013  FINDINGS: Left side down decubitus view of the chest demonstrates a moderate free flowing left pleural effusion. Left lung atelectasis noted.  IMPRESSION: Moderate free-flowing left pleural effusion.   Electronically Signed   By: Charlett NoseKevin  Dover M.D.   On: 09/05/2013 09:17    EKG:  NSR with diffuse T wave inversions laterally Telemetry shows several runs of wide complex tachycardia that are irregular and most likely afib with RVR with aberration.  ASSESSMENT:  1.  Wide complex tachycardia that is irregular and most likely represents afib with RVR and aberrancy.  He has received Lopressor 5mg  IV and is on PO Carvedilol 2.  ASCAD s/p recent CABG 3.  Moderate pericardial effusion for thoracentesis today 4.  Moderate LV dysfunction with ischemic DCM - EF preop 30-35%  PLAN:   1.  Increase Coreg to 25mg  BID 2.  Given reduced DLCO on PFT's preop would like to avoid amiodarone but if he has more PAF on higher dose of  Coreg will need to consider adding Amio in the short term 3.  Will need reassessment of EF in 3 months post revascularization to assess need for prophylactic AICD  Quintella ReichertURNER,TRACI R, MD  09/06/2013  11:00 AM

## 2013-09-06 NOTE — Progress Notes (Signed)
CSW awaiting facility in [place] to get [employer] insurance authorization at this time.  Maree KrabbeLindsay Chanique Duca, MSW, Theresia MajorsLCSWA [phone]

## 2013-09-06 NOTE — Discharge Instructions (Signed)
Activity: 1.May walk up steps °               2.No lifting more than ten pounds for four weeks.  °               3.No driving for four weeks. °               4.Stop any activity that causes chest pain, shortness of breath, dizziness,                            sweating or excessive weakness. °               5.Avoid straining. °               6.Continue with your breathing exercises daily. ° °Diet: Diabetic diet and Low fat, Low salt  diet ° °Wound Care: May shower.  Clean wounds with mild soap and water daily. Contact the office at [phone] if any problems arise. ° °Coronary Artery Bypass Grafting, Care After °Refer to this sheet in the next few weeks. These instructions provide you with information on caring for yourself after your procedure. Your health care provider may also give you more specific instructions. Your treatment has been planned according to current medical practices, but problems sometimes occur. Call your health care provider if you have any problems or questions after your procedure. °WHAT TO EXPECT AFTER THE PROCEDURE °Recovery from surgery will be different for everyone. Some people feel well after 3 or 4 weeks, while for others it takes longer. After your procedure, it is typical to have the following: °· Nausea and a lack of appetite.   °· Constipation. °· Weakness and fatigue.   °· Depression or irritability.   °· Pain or discomfort at your incision site. °HOME CARE INSTRUCTIONS °· Only take over-the-counter or prescription medicines as directed by your health care provider. Take all medicines exactly as directed. Do not stop taking medicines or start any new medicines without first checking with your health care provider.   °· Take your pulse as directed by your health care provider. °· Perform deep breathing as directed by your health care provider. If you were given a device called an incentive spirometer, use it to practice deep breathing several times a day. Support your chest with  a pillow or your arms when you take deep breaths or cough. °· Keep incision areas clean, dry, and protected. Remove or change any bandages (dressings) only as directed by your health care provider. You may have skin adhesive strips over the incision areas. Do not take the strips off. They will fall off on their own. °· Check incision areas daily for any swelling, redness, or drainage. °· If incisions were made in your legs, do the following: °· Avoid crossing your legs.   °· Avoid sitting for long periods of time. Change positions every 30 minutes.   °· Elevate your legs when you are sitting.   °· Wear compression stockings as directed by your health care provider. These stockings help keep blood clots from forming in your legs. °· Take showers once your health care provider approves. Until then, only take sponge baths. Pat incisions dry. Do not rub incisions with a washcloth or towel. Do not take tub baths or go swimming until your health care provider approves. °· Eat foods that are high in fiber, such as raw fruits and vegetables, whole grains, beans, and nuts. Meats should be lean cut. Avoid   canned, processed, and fried foods. °· Drink enough fluids to keep your urine clear or pale yellow. °· Weigh yourself every day. This helps identify if you are retaining fluid that may make your heart and lungs work harder.   °· Rest and limit activity as directed by your health care provider. You may be instructed to: °· Stop any activity at once if you have chest pain, shortness of breath, irregular heartbeats, or dizziness. Get help right away if you have any of these symptoms. °· Move around frequently for short periods or take short walks as directed by your health care provider. Increase your activities gradually. You may need physical therapy or cardiac rehabilitation to help strengthen your muscles and build your endurance. °· Avoid lifting, pushing, or pulling anything heavier than 10 lb (4.5 kg) for at least 6  weeks after surgery. °· Do not drive until your health care provider approves.  °· Ask your health care provider when you may return to work and resume sexual activity. °· Follow up with your health care provider as directed.   °SEEK MEDICAL CARE IF: °· You have swelling, redness, increasing pain, or drainage at the site of an incision.   °· You develop a fever.   °· You have swelling in your ankles or legs.   °· You have pain in your legs.   °· You have weight gain of 2 or more pounds a day. °· You are nauseous or vomit. °· You have diarrhea.  °SEEK IMMEDIATE MEDICAL CARE IF: °· You have chest pain that goes to your jaw or arms. °· You have shortness of breath.   °· You have a fast or irregular heartbeat.   °· You notice a "clicking" in your breastbone (sternum) when you move.   °· You have numbness or weakness in your arms or legs. °· You feel dizzy or lightheaded.   °MAKE SURE YOU: °· Understand these instructions. °· Will watch your condition. °· Will get help right away if you are not doing well or get worse. °Document Released: 01/10/2005 Document Revised: 02/23/2013 Document Reviewed: 11/30/2012 °[employer]® Patient Information ©2014 [employer]. ° ° ° °

## 2013-09-06 NOTE — Progress Notes (Signed)
Pt  asleep with ventricular rate per telemetry as high as 196 over < 6sec period, b/p 150/101, 02 sat 94% on ra. No noted distress. MD made aware and orders noted.

## 2013-09-06 NOTE — Discharge Summary (Signed)
Physician Discharge Summary       [address]       [place] [address]             [id number]    Patient ID: Jerry Graham MRN: [id number] DOB/AGE: Aug 24, 1950 63 y.o.  Admit date: 08/30/2013 Discharge date: 09/06/2013    Admission Diagnoses: 1. 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 History of hypertension 3. History of DM 4. History of hyperlipidemia 5. History of tobacco abuse 6. History of CHF 7. History of CVA    Discharge Diagnoses:  1. 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 History of hypertension 3. History of DM 4. History of hyperlipidemia 5. History of tobacco abuse 6. History of CHF 7. Left pleural effusion 8.History of CVA 8. ABL anemia     Procedure (s):  1.Coronary artery bypass grafting x3 (left internal mammary artery to  LAD, saphenous vein graft to OM1, saphenous vein graft to OM2) .  2. Endoscopic harvest of right leg greater saphenous vein by Dr. Donata Clay On 08/30/2013.  2.Left thoracentesis on 09/07/2013 by IR     History of Presenting Illness: This is a 63 year old  African American male whose cardiac risk factors include hypertension, diabetes, hyperlipidemia, and active smoker returned to see Dr. Donata Clay for further discussion of severe three-vessel CAD with LVEF 35%. Patient had a CVA in October of last year and now has recovered fairly well. His diabetic control has improved with significant efforts from his primary care provider--last A1c 8.5. The patient has reduced his smoking significantly to less than half a pack a day. He does not drink alcohol Since his last visit, PFTs were performed demonstrating ED 12.1-71%, FVC 3.0-70%, and DLCO 57%. Chest x-ray today shows chronic atelectasis -scarring right middle lobe, no evidence of CHF. Potential risks, benefits, and complications of the surgery were discussed with the patient and he agreed to proceed. He underwent a CABG x 3 on 08/30/2013.     Brief Hospital Course:  The patient was  extubated late the evening of surgery without difficulty. He remained afebrile and hemodynamically stable. He was weaned off of Dopamine and Milrinone drips.Theone Murdoch, a line, chest tubes, and foley were removed early in the post operative course. He did develop confusion/post op delirium in the ICU. This later did resolve. Lopressor was started and titrated accordingly. He was volume overloaded and diuresed. He did have ABL anemia. His last H and H was up to 10.6 and 30.6. He was weaned off the insulin drip. Once he was tolerating a diet, he was restarted on Glipizide and Metformin. The patient's HGA1C pre-op was  9.5.  He will require further follow up as an outpatient. The patient was felt surgically stable for transfer from the ICU to stepdown for further convalescence on 09/04/2012.   He has been having frequent PVCs and brief runs of ventricular tachycardia.  He was evaluated by cardiology and his Coreg dose was increased.  He also was started on low dose Amiodarone, to continue for only 3 week, in light of his borderline PFTs pre-op.  He continues to progress with cardiac rehab. A recent chest x-ray showed a moderate left pleural effusion. A left thoracentesis was performed on 09/06/2013, yielding 400 ml of bloody fluid. Follow up chest x-ray shows improvement in the effusion.  He is ambulating on room air. He has been tolerating a diet and has had a bowel movement.  Epicardial pacing wires and chest tube sutures have been remove. He is currently  surgically stable for discharge to a skilled nursing facility on 09/07/2012.    Latest Vital Signs: Blood pressure 130/75, pulse 91, temperature 98.3 F (36.8 C), temperature source Oral, resp. rate 18, height 5\' 10"  (1.778 m), weight 76.9 kg (169 lb 8.5 oz), SpO2 92.00%.  Physical Exam: Cardiovascular: Slightly tachycardic  Pulmonary: Diminished at bases L much worse than R; no rales, wheezes, or rhonchi.  Abdomen: Soft, non tender, bowel sounds present.    Extremities:No lower extremity edema.  Wounds: Clean and dry. No erythema or signs of infection.     Discharge Condition:Stable  Recent laboratory studies:  Lab Results  Component Value Date   WBC 11.3* 09/03/2013   HGB 10.6* 09/03/2013   HCT 30.6* 09/03/2013   MCV 81.2 09/03/2013   PLT 251 09/03/2013   Lab Results  Component Value Date   NA 144 09/03/2013   K 4.1 09/03/2013   CL 103 09/03/2013   CO2 30 09/03/2013   CREATININE 0.87 09/03/2013   GLUCOSE 97 09/03/2013      Diagnostic Studies:   Dg Chest 2 View  09/07/2013   CLINICAL DATA: Status post coronary artery bypass graft.  EXAM:  CHEST 2 VIEW  COMPARISON: September 06, 2013.  FINDINGS:  Stable cardiomegaly. Status post coronary artery bypass graft. Right  lung is clear. Old right distal clavicular fracture is noted. No  pneumothorax is seen. Mild left pleural effusion is noted which is  decreased compared to prior exam.  IMPRESSION:  Mild left pleural effusion is noted which appears to be decreased  compared to prior exam. No pneumothorax is seen.    Discharge Instructions: Activity- Please ambulate patient at least daily. Continue sternal precautions. Diet- Continue heart healthy, carb modified diet. Wound care- Shower daily and clean incisions with soap and water.    Follow Up Appointments:  Future Appointments Provider Department Dept Phone   09/28/2013 11:30 AM Kerin PernaPeter Van Trigt, MD [facility] [place] [phone]   10/26/2013 9:00 AM Orville Governaquel Rey, NP [facility] [place] [phone]     Follow-up Information   Follow up with Dr. Kristeen MissPhilip Nahser On 09/26/2013. (Appointment time is at 9:00 am)    Contact information:   [facility] [address] [place], [place] [address] [phone]      Follow up with Mikey BussingVAN TRIGT III,PETER, MD On 09/28/2013. (PA/LAT CXR to be taken (at [facility] which is in the same building as Dr. Zenaida NieceVan Trigt's office) on  09/28/2013 at 10:30 am;Appointment with Dr. Donata ClayVan Trigt is at 11:30 am)    Specialty:  Cardiothoracic Surgery   Contact information:   [address] [place] [address] [phone]       Follow up with Rey,Raquel, NP. (Call for a follow up appointment regarding further diabetes management and pre op HGA1C 9.5)    Specialty:  Nurse Practitioner   Contact information:   [address] [place] [address] [phone]        Discharge Medications:   Medication List         amiodarone 200 MG tablet  Commonly known as:  PACERONE  Take 1 tablet (200 mg total) by mouth 2 (two) times daily. X 3 weeks     aspirin 325 MG tablet  Take 1 tablet (325 mg total) by mouth daily.     atorvastatin 80 MG tablet  Commonly known as:  LIPITOR  Take 1 tablet (80 mg total) by mouth daily at  6 PM.     budesonide-formoterol 160-4.5 MCG/ACT inhaler  Commonly known as:  SYMBICORT  Inhale 2 puffs into the lungs 2 (two) times daily.     carvedilol 25 MG tablet  Commonly known as:  COREG  Take 1 tablet (25 mg total) by mouth 2 (two) times daily with a meal.     glipiZIDE 5 MG tablet  Commonly known as:  GLUCOTROL  Take 1 tablet (5 mg total) by mouth daily before breakfast.     lisinopril 20 MG tablet  Commonly known as:  PRINIVIL,ZESTRIL  Take 1 tablet (20 mg total) by mouth daily.     metFORMIN 500 MG tablet  Commonly known as:  GLUCOPHAGE  Take 2 tablets (1,000 mg total) by mouth 2 (two) times daily with a meal.     thiamine 100 MG tablet  Take 1 tablet (100 mg total) by mouth daily.     traMADol 50 MG tablet  Commonly known as:  ULTRAM  Take 1-2 tablets (50-100 mg total) by mouth every 4 (four) hours as needed for moderate pain.         The patient has been discharged on:   1.Beta Blocker:  Yes [  x ]                              No   [   ]                              If No, reason:  2.Ace Inhibitor/ARB: Yes [ x  ]                                      No  [    ]                                     If No, reason:  3.Statin:   Yes [  x ]                  No  [   ]                  If No, reason:  4.Ecasa:  Yes  [ x  ]                  No   [   ]                  If No, reason:   Signed: ZIMMERMAN,DONIELLE MPA-C 09/06/2013, 9:26 AM

## 2013-09-06 NOTE — Progress Notes (Addendum)
      [address]       [place] [id number]             [phone]        7 Days Post-Op Procedure(s) (LRB): CORONARY ARTERY BYPASS GRAFTING (CABG) (N/A) INTRAOPERATIVE TRANSESOPHAGEAL ECHOCARDIOGRAM (N/A)  Subjective: Patient eating breakfast. He has no specific complaints.  Objective: Vital signs in last 24 hours: Temp:  [98 F (36.7 C)-98.5 F (36.9 C)] 98.3 F (36.8 C) (03/03 0453) Pulse Rate:  [90-92] 91 (03/03 0453) Cardiac Rhythm:  [-] Normal sinus rhythm (03/02 2000) Resp:  [18] 18 (03/03 0453) BP: (125-135)/(68-77) 130/75 mmHg (03/03 0453) SpO2:  [90 %-96 %] 92 % (03/03 0453) Weight:  [76.9 kg (169 lb 8.5 oz)] 76.9 kg (169 lb 8.5 oz) (03/03 0453)  Pre op weight 79 kg Current Weight  09/06/13 76.9 kg (169 lb 8.5 oz)      Intake/Output from previous day: 03/02 0701 - 03/03 0700 In: 240 [P.O.:240] Out: 400 [Urine:400]   Physical Exam:  Cardiovascular: Slightly tachycardic Pulmonary: Diminished at bases L much worse than R; no rales, wheezes, or rhonchi. Abdomen: Soft, non tender, bowel sounds present. Extremities:No lower extremity edema. Wounds: Clean and dry.  No erythema or signs of infection.  Lab Results: CBC:No results found for this basename: WBC, HGB, HCT, PLT,  in the last 72 hours BMET: No results found for this basename: NA, K, CL, CO2, GLUCOSE, BUN, CREATININE, CALCIUM,  in the last 72 hours  PT/INR:  Lab Results  Component Value Date   INR 1.31 08/30/2013   INR 1.02 08/26/2013   INR 1.1 06/13/2013   ABG:  INR: Will add last result for INR, ABG once components are confirmed Will add last 4 CBG results once components are confirmed  Assessment/Plan:  1. CV - SR. On Coreg 12.5 bid and Lisinopril 20 daily. 2.  Pulmonary - CXR showed yesterday showed moderate left pleural effusion. Scheduled for a left thoracentesis.Encourage incentive spirometer 3. Remove EPW today  4.  Acute blood loss anemia - Last H and H 10.6 and  30.6 5. DM-CBGs 167/116/144. On Metformin 1000 bid, Glipizide 5 daily. Pre op HGA1C 9.5. Will need follow up as an outpatient. 6. Possible discharge to SNF in am  ZIMMERMAN,DONIELLE MPA-C 09/06/2013,7:50 AM  Moderate left pleural effusion-thoracentesis ordered Chest x-ray in a.m., if stable probably ready for placement in skilled nursing facility tomorrow Continue Lasix for 30 days

## 2013-09-07 ENCOUNTER — Inpatient Hospital Stay (HOSPITAL_COMMUNITY): Payer: Medicaid Other

## 2013-09-07 DIAGNOSIS — I2510 Atherosclerotic heart disease of native coronary artery without angina pectoris: Principal | ICD-10-CM

## 2013-09-07 DIAGNOSIS — Z951 Presence of aortocoronary bypass graft: Secondary | ICD-10-CM

## 2013-09-07 LAB — COMPREHENSIVE METABOLIC PANEL
ALT: 25 U/L (ref 0–53)
AST: 31 U/L (ref 0–37)
Albumin: 2.6 g/dL — ABNORMAL LOW (ref 3.5–5.2)
Alkaline Phosphatase: 83 U/L (ref 39–117)
BUN: 14 mg/dL (ref 6–23)
CO2: 23 mEq/L (ref 19–32)
Calcium: 8.9 mg/dL (ref 8.4–10.5)
Chloride: 103 mEq/L (ref 96–112)
Creatinine, Ser: 0.79 mg/dL (ref 0.50–1.35)
GFR calc Af Amer: >90 mL/min (ref >90)
GFR calc non Af Amer: >90 mL/min (ref >90)
Glucose, Bld: 158 mg/dL — ABNORMAL HIGH (ref 70–99)
Potassium: 4.1 mEq/L (ref 3.7–5.3)
Sodium: 140 mEq/L (ref 137–147)
Total Bilirubin: 0.7 mg/dL (ref 0.3–1.2)
Total Protein: 6.1 g/dL (ref 6.0–8.3)

## 2013-09-07 LAB — CBC
HCT: 31.4 % — ABNORMAL LOW (ref 39.0–52.0)
Hemoglobin: 10.7 g/dL — ABNORMAL LOW (ref 13.0–17.0)
MCH: 27.9 pg (ref 26.0–34.0)
MCHC: 34.1 g/dL (ref 30.0–36.0)
MCV: 82 fL (ref 78.0–100.0)
Platelets: 375 10*3/uL (ref 150–400)
RBC: 3.83 MIL/uL — ABNORMAL LOW (ref 4.22–5.81)
RDW: 14.1 % (ref 11.5–15.5)
WBC: 10.9 10*3/uL — ABNORMAL HIGH (ref 4.0–10.5)

## 2013-09-07 LAB — GLUCOSE, CAPILLARY
Glucose-Capillary: 180 mg/dL — ABNORMAL HIGH (ref 70–99)
Glucose-Capillary: 154 mg/dL — ABNORMAL HIGH (ref 70–99)
Glucose-Capillary: 131 mg/dL — ABNORMAL HIGH (ref 70–99)

## 2013-09-07 LAB — TSH: TSH: 2.383 u[IU]/mL (ref 0.350–4.500)

## 2013-09-07 MED ORDER — CARVEDILOL 25 MG PO TABS: 25.0000 mg | ORAL_TABLET | Freq: Two times a day (BID) | ORAL | Status: DC

## 2013-09-07 MED ORDER — AMIODARONE HCL 200 MG PO TABS: 200.0000 mg | ORAL_TABLET | Freq: Two times a day (BID) | ORAL | Status: DC

## 2013-09-07 MED ORDER — AMIODARONE HCL 200 MG PO TABS
200.0000 mg | ORAL_TABLET | Freq: Two times a day (BID) | ORAL | Status: DC
  Administered 2013-09-07: 200 mg via ORAL
  Filled 2013-09-07 (×2): qty 1

## 2013-09-07 MED ORDER — METFORMIN HCL 500 MG PO TABS: 1000.0000 mg | ORAL_TABLET | Freq: Two times a day (BID) | ORAL | Status: DC

## 2013-09-07 MED ORDER — TRAMADOL HCL 50 MG PO TABS: 50.0000 mg | ORAL_TABLET | ORAL | Status: DC | PRN

## 2013-09-07 MED ORDER — BUDESONIDE-FORMOTEROL FUMARATE 160-4.5 MCG/ACT IN AERO: 2.0000 | INHALATION_SPRAY | Freq: Two times a day (BID) | RESPIRATORY_TRACT | Status: DC

## 2013-09-07 MED ORDER — THIAMINE HCL 100 MG PO TABS: 100.0000 mg | ORAL_TABLET | Freq: Every day | ORAL | Status: DC

## 2013-09-07 NOTE — Progress Notes (Signed)
[facility] [place].  Pt is hopeful for clothes to arrive via family prior to transport, CSW aware and attempting to coordinate.

## 2013-09-07 NOTE — Progress Notes (Signed)
Patient: Jerry Graham / Admit Date: 08/30/2013 / Date of Encounter: 09/07/2013, 9:36 AM   Subjective  Feels well. "Ready to break out of here." No CP or SOB. Denies h/o syncope or palps.  Objective   Telemetry: NSR, occ PVCs, 5 beats NSVT. No further sustained tachy with similar morphology to yesterday's event.  Physical Exam: Blood pressure 119/75, pulse 87, temperature 98 F (36.7 C), temperature source Oral, resp. rate 18, height 5\' 10"  (1.778 m), weight 165 lb 14.4 oz (75.252 kg), SpO2 94.00%. General: Well developed, well nourished AAM in no acute distress. Head: Normocephalic, atraumatic, sclera non-icteric, no xanthomas, nares are without discharge. Neck: JVP not elevated. Lungs: Clear bilaterally to auscultation without wheezes, rales, or rhonchi. Breathing is unlabored. Heart: RRR S1 S2 without murmurs, rubs, or gallops. Sternal scar is beginning healing. Abdomen: Soft, non-tender, non-distended with normoactive bowel sounds. No rebound/guarding. Extremities: No clubbing or cyanosis. No edema. Distal pedal pulses are 2+ and equal bilaterally. Neuro: Alert and oriented X 3. Moves all extremities spontaneously. Psych:  Responds to questions appropriately with a normal affect.   Intake/Output Summary (Last 24 hours) at 09/07/13 0936 Last data filed at 09/07/13 0009  Gross per 24 hour  Intake    600 ml  Output    100 ml  Net    500 ml    Inpatient Medications:  . amiodarone  200 mg Oral BID  . aspirin EC  325 mg Oral Daily  . atorvastatin  80 mg Oral q1800  . budesonide-formoterol  2 puff Inhalation BID  . carvedilol  25 mg Oral BID WC  . docusate sodium  200 mg Oral Daily  . enoxaparin (LOVENOX) injection  40 mg Subcutaneous Q24H  . famotidine  20 mg Oral BID  . glipiZIDE  5 mg Oral QAC breakfast  . insulin aspart  0-24 Units Subcutaneous TID AC & HS  . lisinopril  20 mg Oral Daily  . metFORMIN  1,000 mg Oral BID WC  . sodium chloride  3 mL Intravenous Q12H  .  thiamine  100 mg Oral Daily   Infusions:    Labs:  Recent Labs  09/07/13 0305  NA 140  K 4.1  CL 103  CO2 23  GLUCOSE 158*  BUN 14  CREATININE 0.79  CALCIUM 8.9    Recent Labs  09/07/13 0305  AST 31  ALT 25  ALKPHOS 83  BILITOT 0.7  PROT 6.1  ALBUMIN 2.6*    Recent Labs  09/07/13 0305  WBC 10.9*  HGB 10.7*  HCT 31.4*  MCV 82.0  PLT 375     Radiology/Studies:  Dg Chest 1 View  09/06/2013   CLINICAL DATA:  Status post thoracentesis.  EXAM: CHEST - 1 VIEW  COMPARISON:  09/05/2013.  FINDINGS: Interval slight reduction in the left-sided pleural effusion. No postprocedural pneumothorax.  IMPRESSION: Slight reduction in left-sided pleural effusion. No postprocedural pneumothorax.   Electronically Signed   By: Loralie Champagne M.D.   On: 09/06/2013 15:20   Dg Chest 2 View  09/07/2013   CLINICAL DATA:  Status post coronary artery bypass graft.  EXAM: CHEST  2 VIEW  COMPARISON:  September 06, 2013.  FINDINGS: Stable cardiomegaly. Status post coronary artery bypass graft. Right lung is clear. Old right distal clavicular fracture is noted. No pneumothorax is seen. Mild left pleural effusion is noted which is decreased compared to prior exam.  IMPRESSION: Mild left pleural effusion is noted which appears to be decreased compared to prior exam.  No pneumothorax is seen.   Electronically Signed   By: Roque Lias M.D.   On: 09/07/2013 08:09   Dg Chest 2 View  09/05/2013   CLINICAL DATA:  Left pleural effusion  EXAM: CHEST  2 VIEW  COMPARISON:  DG CHEST LEFT DECUBITUS dated 09/05/2013; DG CHEST 2 VIEW dated 09/03/2013  FINDINGS: Moderate left pleural effusion again noted, unchanged since prior study. Left basilar opacity again noted, stable, likely atelectasis. Right lung is clear.  Prior CABG.  Mild cardiomegaly.  No acute bony abnormality.  IMPRESSION: Stable moderate left pleural effusion with left base atelectasis.   Electronically Signed   By: Charlett Nose M.D.   On: 09/05/2013 09:15   Dg  Chest 2 View  09/03/2013   CLINICAL DATA:  Post CABG  EXAM: CHEST  2 VIEW  COMPARISON:  DG CHEST 1V PORT dated 09/02/2013; DG CHEST 2 VIEW dated 08/26/2013; DG CHEST 2 VIEW dated 03/31/2013; DG CHEST 2 VIEW dated 03/30/2013  FINDINGS: Grossly unchanged enlarged cardiac silhouette and mediastinal contours post median sternotomy and CABG. Stable positioning of support apparatus. Unchanged small left-sided pleural effusion with associated left basilar heterogeneous / consolidative opacities. Improved aeration of the right lung with persistent trace right-sided effusion. No new focal airspace opacities. No evidence of edema. No pneumothorax. Unchanged bones.  IMPRESSION: Improved aeration of the lungs with persistent trace right and small left-sided effusions with associated left basilar atelectasis.   Electronically Signed   By: Simonne Come M.D.   On: 09/03/2013 10:26   Dg Chest 2 View  08/26/2013   CLINICAL DATA:  Preop CABG.  EXAM: CHEST  2 VIEW  COMPARISON:  07/20/2013  FINDINGS: Lungs are adequately inflated and otherwise clear. Cardiomediastinal silhouette and remainder of the exam is unchanged.  IMPRESSION: No active cardiopulmonary disease.   Electronically Signed   By: Elberta Fortis M.D.   On: 08/26/2013 16:15   Dg Chest Left Decubitus  09/05/2013   CLINICAL DATA:  Left pleural effusion.  EXAM: CHEST - LEFT DECUBITUS  COMPARISON:  09/03/2013  FINDINGS: Left side down decubitus view of the chest demonstrates a moderate free flowing left pleural effusion. Left lung atelectasis noted.  IMPRESSION: Moderate free-flowing left pleural effusion.   Electronically Signed   By: Charlett Nose M.D.   On: 09/05/2013 09:17   Dg Chest Port 1 View  09/02/2013   CLINICAL DATA:  Status post CABG.  EXAM: PORTABLE CHEST - 1 VIEW  COMPARISON:  09/01/2013  FINDINGS: Right jugular Cordis is unchanged with tip near the brachiocephalic vein-SVC junction, unchanged. Sequelae of prior CABG are again identified. The lungs are slightly  better inflated than on the prior study. There is improved aeration of the right lung base. Left basilar opacity does not appear significantly changed. No large pleural effusion or pneumothorax is identified.  IMPRESSION: Improved right basilar aeration. Persistent left basilar opacity, which may represent atelectasis.   Electronically Signed   By: Sebastian Ache   On: 09/02/2013 07:56   Dg Chest Port 1 View  09/01/2013   CLINICAL DATA:  Post CABG  EXAM: PORTABLE CHEST - 1 VIEW  COMPARISON:  08/31/2013  FINDINGS: Cardiomegaly again noted. Status post CABG. Right basilar atelectasis or infiltrate. Small left pleural effusion with basilar atelectasis or infiltrate. No pulmonary edema. Right IJ sheath in place is noted. Left chest tube has been removed. No pneumothorax.  IMPRESSION: Left chest tube has been removed. Bilateral basilar atelectasis or infiltrate. Small left pleural effusion. No pulmonary edema.  No pneumothorax. Cardiomegaly.   Electronically Signed   By: Natasha MeadLiviu  Pop M.D.   On: 09/01/2013 08:11   Dg Chest Portable 1 View In Am  08/31/2013   CLINICAL DATA:  Coronary artery bypass  EXAM: PORTABLE CHEST - 1 VIEW  COMPARISON:  DG CHEST 1V PORT dated 08/30/2013  FINDINGS: Endotracheal and NG tubes removed. Swan-Ganz catheter and chest tubes stable. No pneumothorax. Bibasilar atelectasis left greater than right. Normal pulmonary vascularity. No interstitial edema.  IMPRESSION: Extubated.  Bibasilar atelectasis left greater than right.   Electronically Signed   By: Maryclare BeanArt  Hoss M.D.   On: 08/31/2013 08:11   Dg Chest Portable 1 View  08/30/2013   CLINICAL DATA:  Status post CABG.  EXAM: PORTABLE CHEST - 1 VIEW  COMPARISON:  DG CHEST 2 VIEW dated 08/26/2013  FINDINGS: Interval median sternotomy. Endotracheal tube terminates 3.0 cm above carina. Right IJ Swan-Ganz catheter appropriately positioned with tip at pulmonary outflow tract.  Left-sided chest tube and mediastinal drain are unchanged in position.  Nasogastric tube terminates at the body of the stomach.  Cardiomegaly accentuated by AP portable technique. Atherosclerosis in the transverse aorta. Possible small left pleural effusion. No pneumothorax. No congestive failure. Low lung volumes with mild bibasilar atelectasis, greater on the left.  IMPRESSION: Expected appearance after median sternotomy, with appropriate position of support apparatus and no evidence of pneumothorax.  Possible small left pleural effusion with bibasilar volume loss/atelectasis.   Electronically Signed   By: Jeronimo GreavesKyle  Talbot M.D.   On: 08/30/2013 14:02     Assessment and Plan  ASSESSMENT:  1. Wide complex tachycardia - per notes yesterday, most likely represents afib with RVR and aberrancy. This was very fast - 190-210. No further episodes of this, however, this morning has had a 5 beat run of what looks more likely to be NSVT. Coreg increased yesterday. Amiodarone was started by primary team. Check baseline TSH. LFTs are OK. Reduced DLCO on PFT's preop so would reassess along the way for long-term need. Will discuss with MD, in light of h/o CVA.  2. ASCAD s/p recent CABG 08/30/13, doing well. 3. Moderate pleural effusion - s/p thoracentesis 09/06/13. 4. Moderate LV dysfunction with ischemic DCM - EF preop 30-35% - will need reassessment of EF in 3 months post revascularization to assess need for prophylactic AICD  5. H/o CVAs (09/2011 - R internal capsule infarct; 03/2013: lacunar infarct of R thalamus. TEE negative for LA/LAA thrombus 04/2013, no ASD or PFO. 30 day event monitor 05/2013 showing NSR occ PVC). See above.  Signed, Ronie Spiesayna Dunn PA-C  Personally seen and examined. Agree with above. TELE reviewed - NSVT - amio At risk for future occurrence of AFIB. If prolonged afib in future, anticoagulate.   Donato SchultzSKAINS, Dwayne Begay, MD

## 2013-09-07 NOTE — Progress Notes (Addendum)
Update: CSW received phone call from facility, stating they are able to take patient pending insurance authorization because "they have a working relationship with family and patient has backup medicaid." CSW spoke to patient's sister [name] over the phone to explain this. Patient's sister appeared to be upset, stating that she does not understand how patient is released from hospital already. CSW explained that MD states that patient is medically ready. CSW provided the transportation options to family. Patient's sister states that family cannot come bring him and non emergency ambulance can take patient. CSW explained to family that the EMS bill is never guaranteed to be 100% covered and will be billed to insurance. Patient's sister states she is agreeable to the plan. CSW set up arrangements for 3pm. CSW signing off at this time.  CSW attempted to call patient's sister [name], with the number she provided, no answer, left voicemail. CSW then spoke to patient's family, [name], over the phone and provided an update, explaining that the facility is working on [employer] insurance approval and is hopeful for the approval this afternoon. Patient's sister, [name], states that she will talk with her family and provide this information to them. CSW has made contact with the facility and they state they are waiting on insurance, but hopeful to have it by this afternoon. CSW will update when new information arises.  Jerry Graham, MSW, Theresia MajorsLCSWA [phone]

## 2013-09-07 NOTE — Progress Notes (Signed)
Clinical Social Work Department CLINICAL SOCIAL WORK PLACEMENT NOTE 09/07/2013  Patient:  Ernest,Issiac  Account Number:  [id number] Admit date:  08/30/2013  Clinical Social Worker:  Jetta LoutBAILEY MORGAN, Theresia MajorsLCSWA  Date/time:  09/03/2013 11:51 AM  Clinical Social Work is seeking post-discharge placement for this patient at the following level of care:   SKILLED NURSING   (*CSW will update this form in Epic as items are completed)   09/03/2013  Patient/family provided with [facility] Department of Clinical Social Work's list of facilities offering this level of care within the geographic area requested by the patient (or if unable, by the patient's family).  09/03/2013  Patient/family informed of their freedom to choose among providers that offer the needed level of care, that participate in Medicare, Medicaid or managed care program needed by the patient, have an available bed and are willing to accept the patient.  09/03/2013  Patient/family informed of [facility]' ownership interest in [facility], as well as of the fact that they are under no obligation to receive care at this facility.  PASARR submitted to EDS on 04/01/2013 PASARR number received from EDS on 04/01/2013  FL2 transmitted to all facilities in geographic area requested by pt/family on  09/03/2013 FL2 transmitted to all facilities within larger geographic area on   Patient informed that his/her managed care company has contracts with or will negotiate with  certain facilities, including the following:     Patient/family informed of bed offers received:  09/06/2013 Patient chooses bed at OTHER Physician recommends and patient chooses bed at    Patient to be transferred to OTHER on  09/07/2013 Patient to be transferred to facility by EMS  The following physician request were entered in Epic:   Additional Comments: Patient has an existing PASARR, however when using last name, first name, SSN, and DOB it cannot  be looked up in [place].  Maree KrabbeLindsay Yamel Bale, MSW, Theresia MajorsLCSWA [phone]

## 2013-09-07 NOTE — Progress Notes (Signed)
EPWs and CT sutures removed per order and unit protocol.  Pt tolerated very well.  All tips intact, sites painted and steri's applied.  VSS, bedrest for 1 hr with call bell in reach.  CCMD notified, will monitor closely.

## 2013-09-07 NOTE — Progress Notes (Addendum)
       [address]       [place] [id number]             [phone]          8 Days Post-Op Procedure(s) (LRB): CORONARY ARTERY BYPASS GRAFTING (CABG) (N/A) INTRAOPERATIVE TRANSESOPHAGEAL ECHOCARDIOGRAM (N/A)  Subjective: Feels well, "ready to get out of here".  Breathing stable following thoracentesis.   Objective: Vital signs in last 24 hours: Patient Vitals for the past 24 hrs:  BP Temp Temp src Pulse Resp SpO2 Weight  09/07/13 0407 119/75 mmHg 98 F (36.7 C) Oral 87 18 91 % -  09/07/13 0404 - - - - - - 165 lb 14.4 oz (75.252 kg)  09/06/13 2132 129/78 mmHg 98.2 F (36.8 C) Oral 128 18 92 % -  09/06/13 2059 - - - - - 98 % -  09/06/13 1501 128/78 mmHg - - - - - -  09/06/13 1451 132/80 mmHg - - - - - -  09/06/13 1249 154/83 mmHg 98.2 F (36.8 C) - 90 18 98 % -  09/06/13 1120 161/82 mmHg - - 88 - 99 % -  09/06/13 1100 149/84 mmHg - - 90 - 95 % -  09/06/13 1059 151/89 mmHg - - 93 - 96 % -  09/06/13 1045 153/83 mmHg - - 92 - 92 % -  09/06/13 0903 - - - - - 92 % -   Current Weight  09/07/13 165 lb 14.4 oz (75.252 kg)  PRE-OPERATIVE WEIGHT: 79kg    Intake/Output from previous day: 03/03 0701 - 03/04 0700 In: 720 [P.O.:720] Out: 500 [Urine:500]  CBGs 119- 158-180    PHYSICAL EXAM:  Heart: RRR Lungs: Slightly decreased BS in R base Wound: Clean and dry Extremities: No significant LE edema    Lab Results: CBC: Recent Labs  09/07/13 0305  WBC 10.9*  HGB 10.7*  HCT 31.4*  PLT 375   BMET:  Recent Labs  09/07/13 0305  NA 140  K 4.1  CL 103  CO2 23  GLUCOSE 158*  BUN 14  CREATININE 0.79  CALCIUM 8.9    PT/INR: No results found for this basename: LABPROT, INR,  in the last 72 hours  CXR: FINDINGS:  Stable cardiomegaly. Status post coronary artery bypass graft. Right lung is clear. Old right distal clavicular fracture is noted. No pneumothorax is seen. Mild left pleural effusion is noted which is decreased compared to prior  exam.  IMPRESSION:  Mild left pleural effusion is noted which appears to be decreased compared to prior exam. No pneumothorax is seen.   Assessment/Plan: S/P Procedure(s) (LRB): CORONARY ARTERY BYPASS GRAFTING (CABG) (N/A) INTRAOPERATIVE TRANSESOPHAGEAL ECHOCARDIOGRAM (N/A)  CV- BPs improved after increased Coreg. Still with frequent PVCs and runs of VT.  Continue current meds.  Discussed with MD- will start low dose Amio for 3 weeks.  Pulm/L effusion- improved since thoracentesis.  Off O2.  DM- sugars stable on home meds.  Delirium- MS at baseline.  Disp- hopefully to SNF today if bed available.    LOS: 8 days    COLLINS,GINA H 09/07/2013 CXR improved, smoking cessation d/w patient Cont coreg, amio 200BID for 3 weeks, lasix, lisinopril patient examined and medical record reviewed,agree with above note.  DC EPW's VAN TRIGT III,Jasher Barkan 09/07/2013

## 2013-09-07 NOTE — Progress Notes (Addendum)
[id number] Cardiac Rehab Completed discharge education with pt. Also CHF education. We discussed smoking cessation with him. He seems committed to quitting. I gave him tips for quitting, coaching contact number and quit smart class information. Pt voices understanding. He agrees to [facility] [place], will send referral. I put recovery from heart surgery on for him to watch. Beatrix FettersHughes, Arrow Emmerich G, RN 09/07/2013 12:33 PM

## 2013-09-08 NOTE — Care Management Note (Signed)
    Page 1 of 1   09/08/2013     2:38:48 PM   CARE MANAGEMENT NOTE 09/08/2013  Patient:  Jerry Graham,Jerry Graham   Account Number:  [id number]  Date Initiated:  08/31/2013  Documentation initiated by:  Avie ArenasBROWN,SARAH  Subjective/Objective Assessment:   post op CABG x3     Action/Plan:   Anticipated DC Date:  09/05/2013   Anticipated DC Plan:  SKILLED NURSING FACILITY  In-house referral  Clinical Social Worker      DC Planning Services  CM consult      Choice offered to / List presented to:             Status of service:  Completed, signed off Medicare Important Message given?   (If response is "NO", the following Medicare IM given date fields will be blank) Date Medicare IM given:   Date Additional Medicare IM given:    Discharge Disposition:  SKILLED NURSING FACILITY  Per UR Regulation:  Reviewed for med. necessity/level of care/duration of stay  If discussed at Long Length of Stay Meetings, dates discussed:    Comments:  Contact:  [place]-Brown,Pamela Sister [phone]   Esau GrewLewis,Nita Other [phone]   Marcell Angertienne,Beverly Sister     [phone]  09/07/13 Raynelle FanningJULIE Nadeem Romanoski,RN,BSN [phone] PT DISCHARGED TO SNF TODAY, PER CSW ARRANGEMENTS.  08-31-13 4pm Avie ArenasSarah Brown, RNBSN [phone]- [phone] per patient states lives at home alone.  family is here with him now but not sure how long - no one in room with pateint at this time.  Talked with pateint about needing 24/7 supervision for 7-10 days.  He states he will check with family tonight.  Will talk more tomorrow.

## 2013-09-08 NOTE — Discharge Summary (Signed)
patient examined and medical record reviewed,agree with above note. VAN TRIGT III,PETER 09/08/2013   

## 2013-09-20 ENCOUNTER — Other Ambulatory Visit: Payer: Self-pay | Admitting: *Deleted

## 2013-09-20 DIAGNOSIS — I2510 Atherosclerotic heart disease of native coronary artery without angina pectoris: Secondary | ICD-10-CM

## 2013-09-26 ENCOUNTER — Encounter: Payer: Self-pay | Admitting: Cardiovascular Disease

## 2013-09-26 ENCOUNTER — Ambulatory Visit (INDEPENDENT_AMBULATORY_CARE_PROVIDER_SITE_OTHER): Payer: No Typology Code available for payment source | Admitting: Cardiovascular Disease

## 2013-09-26 ENCOUNTER — Encounter: Payer: Medicaid Other | Admitting: Cardiovascular Disease

## 2013-09-26 VITALS — BP 120/78 | HR 82 | Ht 70.5 in | Wt 165.0 lb

## 2013-09-26 DIAGNOSIS — I509 Heart failure, unspecified: Secondary | ICD-10-CM

## 2013-09-26 DIAGNOSIS — I4891 Unspecified atrial fibrillation: Secondary | ICD-10-CM

## 2013-09-26 DIAGNOSIS — I5022 Chronic systolic (congestive) heart failure: Secondary | ICD-10-CM

## 2013-09-26 DIAGNOSIS — Z951 Presence of aortocoronary bypass graft: Secondary | ICD-10-CM

## 2013-09-26 NOTE — Patient Instructions (Signed)
Your physician recommends that you continue on your current medications as directed. Please refer to the Current Medication list given to you today.  Your physician recommends that you schedule a follow-up appointment in: 3 months  

## 2013-09-26 NOTE — Assessment & Plan Note (Signed)
Continue with current medications. His blood pressure and heart rate. We'll consider titration of his medicines as needed at his next office visit in 3 months.

## 2013-09-26 NOTE — Assessment & Plan Note (Signed)
[name] is doing very well. He's not having any episodes of angina. He had coronary artery bypass grafting in February. Continue with the same medications. His blood pressure and heart rate are normal.

## 2013-09-26 NOTE — Progress Notes (Signed)
Jerry Graham Date of Birth  1951/02/06       [facility]    [place] [facility] [address]  [address] [place]  [id number]   [place]  [id number] [phone]     [phone]   Fax  [phone]    Fax [phone]  Problem List: 1.  Chronic systolic CHF 2.  CVA 3.  Diabetes mellitus 4. Hyperlipidemia 5. Cigarette smoker 6. CAD- s/p Coronary artery bypass grafting x3 (left internal mammary artery to LAD, saphenous vein graft to OM1, saphenous vein graft to OM2) .Marland Kitchen.    History of Present Illness:  [name] is seen for followup today. He recently had a stroke about one month ago. Has formed a transesophageal echo which revealed moderate to severe left ventricular dysfunction with an ejection fraction of around 30-35%. He was found to have some spontaneous contrast but no evidence of thrombus. He is atrial septum was intact it was no evidence of PFO or ASD.  He presents today for followup.  He's been on lisinopril for quite some time. Carvedilol 6.25 mg twice a day was added after the diagnosis of congestive heart failure.  He has improved his diet. He was previously eating a lot of fast foods but now is consistently staying away from fast foods.    Dec. 8, 2014:  [name] is doing well.  He did have some CP with sexual intercourse.   He continues to have some shortness of breath.  September 26, 2013:  [name] has had CABG ( Feb. 24, 2015) since I last saw him.   Doing well.  Minimal CP.  Having some pain from the surgery.  Breathing OK No angina. Still at the nursing facility.   Current Outpatient Prescriptions on File Prior to Visit  Medication Sig Dispense Refill  . aspirin 325 MG tablet Take 1 tablet (325 mg total) by mouth daily.  30 tablet  11  . atorvastatin (LIPITOR) 80 MG tablet Take 1 tablet (80 mg total) by mouth daily at 6 PM.  30 tablet  11  . budesonide-formoterol (SYMBICORT) 160-4.5 MCG/ACT inhaler  Inhale 2 puffs into the lungs 2 (two) times daily.  1 Inhaler  12  . carvedilol (COREG) 25 MG tablet Take 1 tablet (25 mg total) by mouth 2 (two) times daily with a meal.      . glipiZIDE (GLUCOTROL) 5 MG tablet Take 1 tablet (5 mg total) by mouth daily before breakfast.  60 tablet  9  . lisinopril (PRINIVIL,ZESTRIL) 20 MG tablet Take 1 tablet (20 mg total) by mouth daily.  30 tablet  11  . thiamine 100 MG tablet Take 1 tablet (100 mg total) by mouth daily.       No current facility-administered medications on file prior to visit.    No Known Allergies  Past Medical History  Diagnosis Date  . Hypertension   . Diabetes mellitus without complication   . Stroke 2013, 2014  . Hyperlipidemia   . Tobacco abuse   . CHF (congestive heart failure)   . Short-term memory loss     from stroke  . Coronary artery disease   . A-fib     Past Surgical History  Procedure Laterality Date  . Lung biopsy      years ago  . Tee without cardioversion N/A 04/27/2013    Procedure: TRANSESOPHAGEAL ECHOCARDIOGRAM (TEE);  Surgeon: Vesta MixerPhilip J Janitza Revuelta, MD;  [facility];  Service: Cardiovascular;  Laterality: N/A;  . Intraoperative transesophageal echocardiogram N/A 08/30/2013    Procedure: INTRAOPERATIVE TRANSESOPHAGEAL ECHOCARDIOGRAM;  Surgeon: Kerin Perna, MD;  Location: [facility] OR;  Service: Open Heart Surgery;  Laterality: N/A;  . Cardiac catheterization    . Coronary artery bypass graft N/A 08/30/2013    Procedure: CORONARY ARTERY BYPASS GRAFTING (CABG);  Surgeon: Kerin Perna, MD;  Location[facility];  Service: Open Heart Surgery;  Laterality: N/A;  CABG x  3 , using left internal mammary artery and right leg greater saphenous vein harvested endoscopically    History  Smoking status  . Former Smoker -- 0.25 packs/day for 50 years  . Types: Cigarettes  . Quit date: 08/29/2013  Smokeless tobacco  . Not on file    Comment: currently smokes 1/5 ppd    History  Alcohol Use No    Family  History  Problem Relation Age of Onset  . Scleroderma Mother     Living, 8  . Heart disease Mother   . Diabetes Father     Living, 75  . Hypertension Father   . Transient ischemic attack Father   . Hypertension Sister   . Hyperlipidemia Sister     Reviw of Systems:  Reviewed in the HPI.  All other systems are negative.  Physical Exam: Blood pressure 120/78, pulse 82, height 5' 10.5" (1.791 m), weight 165 lb (74.844 kg). General: Well developed, well nourished, in no acute distress.  Head: Normocephalic, atraumatic, sclera non-icteric, mucus membranes are moist,   Neck: Supple. Carotids are 2 + without bruits. No JVD   Lungs: Clear   Heart: RR, normal S1, s2 Abdomen: Soft, non-tender, non-distended with normal bowel sounds. Msk:  Strength and tone are normal  Extremities: No clubbing or cyanosis. No edema.  Distal pedal pulses are 2+ and equal Neuro: CN II - XII intact.  Alert and oriented X 3.  Speech is slightly slurred.  Gait is ok. Psych:  Normal  ECG: September 26, 2013:  NSR at 86, LVH ;with repol.   Assessment / Plan:

## 2013-09-28 ENCOUNTER — Encounter: Payer: Self-pay | Admitting: Cardiothoracic Surgery

## 2013-09-28 ENCOUNTER — Ambulatory Visit (INDEPENDENT_AMBULATORY_CARE_PROVIDER_SITE_OTHER): Payer: No Typology Code available for payment source | Admitting: Cardiothoracic Surgery

## 2013-09-28 ENCOUNTER — Ambulatory Visit
Admission: RE | Admit: 2013-09-28 | Discharge: 2013-09-28 | Disposition: A | Discharge status: Home / Self Care | Payer: Medicaid Other | Source: Ambulatory Visit | Attending: Cardiothoracic Surgery | Admitting: Cardiothoracic Surgery

## 2013-09-28 VITALS — BP 145/87 | HR 80 | Resp 20 | Ht 70.5 in | Wt 165.0 lb

## 2013-09-28 DIAGNOSIS — Z951 Presence of aortocoronary bypass graft: Secondary | ICD-10-CM

## 2013-09-28 DIAGNOSIS — I2510 Atherosclerotic heart disease of native coronary artery without angina pectoris: Secondary | ICD-10-CM

## 2013-09-28 NOTE — Progress Notes (Signed)
PCP is Rey,Raquel, NP Referring Provider is Nahser, Deloris Ping, MD  Chief Complaint  Patient presents with  . Routine Post Op    F/U from surgery with CXR, S/P CABG x 3 on 08/30/13    HPI: Patient returns for one month surgical followup after CABG x3 for severe three-vessel CAD with reduced LV function. Patient has a history of smoking and poorly controlled diabetes. Postoperatively the patient had confusion, left pleural effusion requiring thoracentesis, and transient atrial fibrillation. The patient was discharged to the hospital to a skilled nursing facility in [place]. The patient is receiving physical therapy and has progressed nicely. He is planning on returning to his home in the next 5 days. According to information from his family, the medical staff at the skilled nursing facility has recommended that the patient does not resume driving because of short-term memory issues and possible early dementia. Patient sustained a CVA last year which may be contributing to his mental status changes. On exam today his mental status appears to be back to baseline-compares with his preoperative mental status.  Past Medical History  Diagnosis Date  . Hypertension   . Diabetes mellitus without complication   . Stroke 2013, 2014  . Hyperlipidemia   . Tobacco abuse   . CHF (congestive heart failure)   . Short-term memory loss     from stroke  . Coronary artery disease   . A-fib     Past Surgical History  Procedure Laterality Date  . Lung biopsy      years ago  . Tee without cardioversion N/A 04/27/2013    Procedure: TRANSESOPHAGEAL ECHOCARDIOGRAM (TEE);  Surgeon: Vesta Mixer, MD;  Location: [facility] ENDOSCOPY;  Service: Cardiovascular;  Laterality: N/A;  . Intraoperative transesophageal echocardiogram N/A 08/30/2013    Procedure: INTRAOPERATIVE TRANSESOPHAGEAL ECHOCARDIOGRAM;  Surgeon: Kerin Perna, MD;  Location: [facility] OR;  Service: Open Heart Surgery;  Laterality: N/A;  . Cardiac  catheterization    . Coronary artery bypass graft N/A 08/30/2013    Procedure: CORONARY ARTERY BYPASS GRAFTING (CABG);  Surgeon: Kerin Perna, MD;  Location: [facility] OR;  Service: Open Heart Surgery;  Laterality: N/A;  CABG x  3 , using left internal mammary artery and right leg greater saphenous vein harvested endoscopically    Family History  Problem Relation Age of Onset  . Scleroderma Mother     Living, 25  . Heart disease Mother   . Diabetes Father     Living, 60  . Hypertension Father   . Transient ischemic attack Father   . Hypertension Sister   . Hyperlipidemia Sister     Social History History  Substance Use Topics  . Smoking status: Former Smoker -- 0.25 packs/day for 50 years    Types: Cigarettes    Quit date: 08/29/2013  . Smokeless tobacco: Not on file     Comment: currently smokes 1/5 ppd  . Alcohol Use: No    Current Outpatient Prescriptions  Medication Sig Dispense Refill  . aspirin 325 MG tablet Take 1 tablet (325 mg total) by mouth daily.  30 tablet  11  . atorvastatin (LIPITOR) 80 MG tablet Take 1 tablet (80 mg total) by mouth daily at 6 PM.  30 tablet  11  . budesonide-formoterol (SYMBICORT) 160-4.5 MCG/ACT inhaler Inhale 2 puffs into the lungs 2 (two) times daily.  1 Inhaler  12  . carvedilol (COREG) 25 MG tablet Take 1 tablet (25 mg total) by mouth 2 (two) times daily with a  meal.      . glipiZIDE (GLUCOTROL) 5 MG tablet Take 1 tablet (5 mg total) by mouth daily before breakfast.  60 tablet  9  . lisinopril (PRINIVIL,ZESTRIL) 20 MG tablet Take 1 tablet (20 mg total) by mouth daily.  30 tablet  11  . metFORMIN (GLUCOPHAGE) 500 MG tablet Take 500 mg by mouth 2 (two) times daily.      Marland Kitchen. thiamine 100 MG tablet Take 1 tablet (100 mg total) by mouth daily.       No current facility-administered medications for this visit.    No Known Allergies  Review of Systems improved strength, improved sleeping, no significant sternal pain. Surgical incisions are healed  well. The records from the SNF indicate his blood glucose has been fairly controlled with oral agents  BP 145/87  Pulse 80  Resp 20  Ht 5' 10.5" (1.791 m)  Wt 165 lb (74.844 kg)  BMI 23.33 kg/m2  SpO2 98% Physical Exam Alert and comfortable appears oriented accompanied by family Lungs clear Sternal incision well-healed, right leg incision well-healed Heart rhythm regular without murmur or gallop No pedal edema  Diagnostic Tests: Chest x-ray clear, no evidence of recurrent left pleural effusion  Impression:  patient appears to be doing well and ready to transition to home after post hospital discharge SNF care. He should continue the current medications he is receiving at the SNF. He was told not to resume driving. He knows not to lift more than 20 pounds. He knows he should not resume smoking. We will attempt to get him referred [facility]. The patient return to the office in a month to review his progress and discuss driving.  Plan: Return for office visit in one month to review progress

## 2013-10-03 ENCOUNTER — Encounter: Payer: Medicaid Other | Admitting: Cardiovascular Disease

## 2013-10-04 ENCOUNTER — Encounter: Payer: Self-pay | Admitting: *Deleted

## 2013-10-04 NOTE — Progress Notes (Signed)
Patient ID: Jerry ArtistRobert Graham, male   DOB: April 08, 1951, 63 y.o.   MRN: [id number] [facility] to the rehab department requesting he be notified to start Cardiac Rehab Phase II per Dr. Zenaida NieceVan Trigt's request.

## 2013-10-05 ENCOUNTER — Ambulatory Visit (INDEPENDENT_AMBULATORY_CARE_PROVIDER_SITE_OTHER): Payer: No Typology Code available for payment source | Admitting: Neurology

## 2013-10-05 ENCOUNTER — Telehealth: Payer: Self-pay | Admitting: Adult Health

## 2013-10-05 ENCOUNTER — Telehealth: Payer: Self-pay | Admitting: Cardiovascular Disease

## 2013-10-05 ENCOUNTER — Encounter: Payer: Self-pay | Admitting: Neurology

## 2013-10-05 VITALS — BP 130/84 | HR 81 | Wt 163.3 lb

## 2013-10-05 DIAGNOSIS — I639 Cerebral infarction, unspecified: Secondary | ICD-10-CM

## 2013-10-05 DIAGNOSIS — I6350 Cerebral infarction due to unspecified occlusion or stenosis of unspecified cerebral artery: Secondary | ICD-10-CM

## 2013-10-05 DIAGNOSIS — F482 Pseudobulbar affect: Secondary | ICD-10-CM

## 2013-10-05 DIAGNOSIS — G3184 Mild cognitive impairment, so stated: Secondary | ICD-10-CM

## 2013-10-05 MED ORDER — ACCU-CHEK SOFTCLIX LANCETS MISC: Status: DC

## 2013-10-05 MED ORDER — GLUCOSE BLOOD VI STRP: ORAL_STRIP | Status: AC

## 2013-10-05 MED ORDER — ONETOUCH ULTRASOFT LANCETS MISC: Status: DC

## 2013-10-05 NOTE — Telephone Encounter (Signed)
Pharmacy called saying insurance would not pay for one touch products, needed to change lancets to accu-chek

## 2013-10-05 NOTE — Telephone Encounter (Signed)
Strips for glucometer needed.  [facility] [address]

## 2013-10-05 NOTE — Telephone Encounter (Signed)
Spoke with pt, has [employer] Elite XL glucometer. States he also needs lancets. Rx sent to pharmacy by escript. Notified pt for future refills, to contact pharmacy for refill and then they will send the request to the office.

## 2013-10-05 NOTE — Patient Instructions (Addendum)
1.  Continue your current medications 2.  Do not recommend driving  3.  Return to clinic in 484-months

## 2013-10-05 NOTE — Addendum Note (Signed)
Addended by: Marchia MeiersEASTWOOD, Raivyn Kabler M on: 10/05/2013 03:54 PM   Modules accepted: Orders

## 2013-10-05 NOTE — Progress Notes (Signed)
[facility] Neurology Division  Follow-up Visit   Date: 10/05/2013    Christorpher Hisaw MRN: [id number] DOB: 11-29-1950   Interim History: Karlis Cregg is a 63 y.o. 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 He is accompanied by his cousin and was last seen in the clinic on 05/10/2013.  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 He woke up feeling unwell and noticed that he kept leaning to the left side. Someone at his apartment noticed that he had a left facial droop and his speech was slurred so he they called EMS. He did not receive IVtPA because he presented out of the window. Work-up including MRA brain, echocardiogram, and labs were done. Vessel imaging revealed moderate stenosis of LCA (cavernous segment), small L ACA and MCA compared to right side with inferior M2 branch stenostic. New systolic heart failure was noted (EF 35%).  He was discharged on aspirin 325 mg. Family has noted that he gets very emotional at times and cries very easily out of proportion to the situation and sometimes unprovoked.  Last year, he had had similar symptoms of left sided weakness and was found to have small lacunar infarct of the in right thalamus and right basal ganglia. Symptoms resolved with a few weeks. He reports being placed on aspirin 81mg  after that.   He moved to [facility] in March 2014 from [place], where his family reside.   - Follow-up 05/10/2013: He reports speech and facial weakness has improved slightly. He denies any additional weakness of his left side, however when walking he  tends to veer on that side.   - Follow-up 10/05/2013:  He underwent CABG x3 in February and has been doing well postoperatively. From a neurological standpoint, he continues to have mild left leg weakness and is doing home PT.  His speech and facial weakness is also improved.   No new weakness, numbness/tingling, headaches.  He feels that his vision is a little blurry and has not had his eye checked in quite sometime.  He stopped smoking three weeks ago!  Diabetes is still uncontrolled.    Medications:  Current Outpatient Prescriptions on File Prior to Visit  Medication Sig Dispense Refill  . aspirin 325 MG tablet Take 1 tablet (325 mg total) by mouth daily.  30 tablet  11  . atorvastatin (LIPITOR) 80 MG tablet Take 1 tablet (80 mg total) by mouth daily at 6 PM.  30 tablet  11  . budesonide-formoterol (SYMBICORT) 160-4.5 MCG/ACT inhaler Inhale 2 puffs into the lungs 2 (two) times daily.  1 Inhaler  12  . carvedilol (COREG) 25 MG tablet Take 1 tablet (25 mg total) by mouth 2 (two) times daily with a meal.      . glipiZIDE (GLUCOTROL) 5 MG tablet Take 1 tablet (5 mg total) by mouth daily before breakfast.  60 tablet  9  . lisinopril (PRINIVIL,ZESTRIL) 20 MG tablet Take 1 tablet (20 mg total) by mouth daily.  30 tablet  11  . metFORMIN (GLUCOPHAGE) 500 MG tablet Take 500 mg by mouth 2 (two) times daily.      Marland Kitchen thiamine 100 MG tablet Take 1 tablet (  100 mg total) by mouth daily.       No current facility-administered medications on file prior to visit.    Allergies: No Known Allergies   Review of Systems:  CONSTITUTIONAL: No fevers, chills, night sweats, or weight loss.   EYES: No visual changes or eye pain ENT: No hearing changes.  No history of nose bleeds.   RESPIRATORY: No cough, wheezing and shortness of breath.   CARDIOVASCULAR: Negative for chest pain, and palpitations.   GI: Negative for abdominal discomfort, blood in stools or black stools.  No recent change in bowel habits.   GU:  No  history of incontinence.   MUSCLOSKELETAL: No history of joint pain or swelling.  No myalgias.   SKIN: Negative for lesions, rash, and itching.   HEMATOLOGY/ONCOLOGY: Negative for prolonged bleeding, bruising easily, and swollen nodes.    ENDOCRINE: Negative for cold or heat intolerance, polydipsia or goiter.   PSYCH:  No depression or anxiety symptoms.   NEURO: As Above.   Vital Signs:  BP 130/84  Pulse 81  Wt 163 lb 5 oz (74.078 kg)  SpO2 94%  Neurological Exam: MOCA test 17/30 (-3 VS,  -1 attention, -1 language, -5 recall, -3 orientation) MENTAL STATUS including orientation to self and year Serial presidents:  Obama, ?? Month:  March, date:  15, day:  Thursday  Speech is mildly dysarthric.   CRANIAL NERVES: Pupils are round and reactive to light. Extraocular muscles are intact. There is very slight left ptosis. Subtle flattening of left nasolabial fold. Smile appears to be symmetric. Palate elevates symmetrically. Tongue is midline  MOTOR:  Motor strength is 5/5 throughout. No atrophy, fasciculations or abnormal movements.  No pronator drift.  Tone is normal.    MSRs: Reflexes are 2+/4 throughout  SENSORY:  Normal and symmetric perception of light touch and vibration  COORDINATION/GAIT: Normal finger-to- nose-finger and heel-to-shin.  Mild dysmetria with finger tapping on the left. Gait narrow based and stable. Tandem and stressed gait intact.   Data:  CT head 03/30/2013:  No intracranial hemorrhage, mass effect or midline shift. Mild cerebral atrophy.There is small lacunar infarct measures about 5 mm in right thalamus. There is small area of decreased attenuation in right basal ganglia. Measures about 1.2 cm. This may be due to ischemia of indeterminate age. Clinical correlation is necessary. If recent ischemia suspected further correlation with brain MRI with diffusion imaging is recommended. No definite acute cortical infarction.   MRI HEAD 03/30/2013  1. Acute  nonhemorrhagic infarct of the right putamen and extending superiorly to the corona radiata.  2. Much smaller focal area of acute nonhemorrhagic infarction in the left corona radiata.  3. Age advanced atrophy and diffuse white matter disease likely reflects the sequela of chronic microvascular ischemia.  4. Remote lacunar infarcts of the basal ganglia bilaterally, right greater than left.  5. Remote lacunar infarct of the left paramedian midbrain.   MRA HEAD 03/30/2013  1. 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  2. Proximal inferior M2 branch vessel stenoses bilaterally, left greater than right.  3. Moderate diffuse small vessel disease.  4. Mild narrowing of the proximal basilar artery.  5. High-grade stenosis of the distal left P2 segment.   US carotids 03/31/2013:  - The vertebral arteries appear patent with antegrade flow. - Findings consistent with 1-39 percent stenosis involvingthe right internal carotid artery and the left internal carotid  artery.  Echo 03/31/2013: EF 30% to 35%. Akinesis of the basal-mid anteroseptal myocardium. Akinesis of the inferior myocardium  TEE 04/27/2013:  Left ventricle: Systolic function was moderately to severely reduced. The estimated ejection fraction was in the range of 30% to 35%.  Lab Results  Component Value Date   CHOL 275* 03/31/2013   HDL 38* 03/31/2013   LDLCALC 197* 03/31/2013   TRIG 202* 03/31/2013   CHOLHDL 7.2 03/31/2013   Lab Results  Component Value Date   HGBA1C 9.5* 08/26/2013     IMPRESSION: 1. History of ischemic stroke  - R putamen/corona radiata and L corona radiata ischemic stroke on 03/30/2013  - Residual deficits include mild left facial droop, dysarthria, and dysphagia, but these are all improving  - Stroke risk factors:  DM (HbA1c 9.5) , HTN, HPL (LDL 197 HDL 38), tobacco use (recently quit), prior stroke, AA male  -  Stroke mechanism: Suspect cardioembolic given bilateral lesions and low EF (30-35%)  - Intracranial vessel imaging shows moderate stenosis of LCA (cavernous segment), small L ACA and MCA compared to right side with inferior M2 branch stenostic 2. 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 Dementia, likely vascular  - Discussed the option of starting medications however patient is not interested at this time  - He has power of attorney for his sister who is helping him with his financial matters  - I have recommended him not to drive due to his poor score on cognitive testing   PLAN/RECOMMENDATIONS:  1. Continue secondary prevention with Aspirin 325mg , lipitor 80mg , and lisinopril 20mg  daily 2. I counseled the patient on better management of his diabetes including diet control 3. Stressed importance of medication compliance and lifestyle modification 4. I have recommended him not to drive due to his poor score on cognitive testing 5. Return to clinic in 4 months, or sooner as needed   The duration of this appointment visit was 30 minutes of face-to-face time with the patient.  Greater than 50% of this time was spent in counseling, explanation of diagnosis, planning of further management, and coordination of care.   Thank you for allowing me to participate in patient's care.  If I can answer any additional questions, I would be pleased to do so.    Sincerely,    Donika K. Allena KatzPatel, DO

## 2013-10-05 NOTE — Telephone Encounter (Deleted)
error 

## 2013-10-10 ENCOUNTER — Encounter: Payer: Self-pay | Admitting: Adult Health

## 2013-10-10 ENCOUNTER — Ambulatory Visit (INDEPENDENT_AMBULATORY_CARE_PROVIDER_SITE_OTHER): Payer: No Typology Code available for payment source | Admitting: Adult Health

## 2013-10-10 VITALS — BP 108/58 | HR 76 | Temp 98.2°F | Wt 166.0 lb

## 2013-10-10 DIAGNOSIS — IMO0001 Reserved for inherently not codable concepts without codable children: Secondary | ICD-10-CM | POA: Diagnosis not present

## 2013-10-10 DIAGNOSIS — R6889 Other general symptoms and signs: Secondary | ICD-10-CM

## 2013-10-10 DIAGNOSIS — IMO0002 Reserved for concepts with insufficient information to code with codable children: Secondary | ICD-10-CM

## 2013-10-10 DIAGNOSIS — E1165 Type 2 diabetes mellitus with hyperglycemia: Secondary | ICD-10-CM

## 2013-10-10 DIAGNOSIS — R899 Unspecified abnormal finding in specimens from other organs, systems and tissues: Secondary | ICD-10-CM

## 2013-10-10 LAB — CBC WITH DIFFERENTIAL/PLATELET
Basophils Absolute: 0.1 10*3/uL (ref 0.0–0.1)
Basophils Relative: 1 % (ref 0.0–3.0)
EOS PCT: 5 % (ref 0.0–5.0)
Eosinophils Absolute: 0.3 10*3/uL (ref 0.0–0.7)
HEMATOCRIT: 39.6 % (ref 39.0–52.0)
Hemoglobin: 13 g/dL (ref 13.0–17.0)
LYMPHS ABS: 2.4 10*3/uL (ref 0.7–4.0)
Lymphocytes Relative: 40.5 % (ref 12.0–46.0)
MCHC: 32.7 g/dL (ref 30.0–36.0)
MCV: 83.4 fl (ref 78.0–100.0)
Monocytes Absolute: 0.6 10*3/uL (ref 0.1–1.0)
Monocytes Relative: 10.1 % (ref 3.0–12.0)
NEUTROS ABS: 2.6 10*3/uL (ref 1.4–7.7)
Neutrophils Relative %: 43.4 % (ref 43.0–77.0)
Platelets: 289 10*3/uL (ref 150.0–400.0)
RBC: 4.74 Mil/uL (ref 4.22–5.81)
RDW: 16.8 % — ABNORMAL HIGH (ref 11.5–14.6)
WBC: 5.9 10*3/uL (ref 4.5–10.5)

## 2013-10-10 LAB — HEMOGLOBIN A1C: HEMOGLOBIN A1C: 7 % — AB (ref 4.6–6.5)

## 2013-10-10 NOTE — Progress Notes (Signed)
Patient ID: Jerry Graham, male   DOB: 1950-11-12, 63 y.o.   MRN: [id number]    Subjective:    Patient ID: Jerry Graham, male    DOB: 1950-11-12, 63 y.o.   MRN: [id number]  HPI  [name] is a pleasant 63 y/o male with hx of HTN, HLD CAD, CABG x 3 (08/30/13), CVA, DM type 2 uncontrolled who presents to clinic for follow up DM. He was in rehab post CABG and has recently returned home. Pt reports feeling well. He is unhappy about not being able to drive. He is hoping that he will be able to return driving sometime soon.  Pertaining to his DM, pt has not been check BG since home from rehab 2/2 machine out of batteries. He will begin checking BG regularly and record starting tomorrow. Discussed importance of diet adherence. No concentrated sweets which is his weakness. Continues to exercise - walks 30 min daily. Reports medication compliance.    Past Medical History  Diagnosis Date  . Hypertension   . Diabetes mellitus without complication   . Stroke 2013, 2014  . Hyperlipidemia   . Tobacco abuse   . CHF (congestive heart failure)   . Short-term memory loss     from stroke  . Coronary artery disease   . A-fib      Past Surgical History  Procedure Laterality Date  . Lung biopsy      years ago  . Tee without cardioversion N/A 04/27/2013    Procedure: TRANSESOPHAGEAL ECHOCARDIOGRAM (TEE);  Surgeon: Vesta MixerPhilip J Nahser, MD;  [facility];  Service: Cardiovascular;  Laterality: N/A;  . Intraoperative transesophageal echocardiogram N/A 08/30/2013    Procedure: INTRAOPERATIVE TRANSESOPHAGEAL ECHOCARDIOGRAM;  Surgeon: Kerin PernaPeter Van Trigt, MD;  [facility];  Service: Open Heart Surgery;  Laterality: N/A;  . Cardiac catheterization    . Coronary artery bypass graft N/A 08/30/2013    Procedure: CORONARY ARTERY BYPASS GRAFTING (CABG);  Surgeon: Kerin PernaPeter Van Trigt, MD;  [facility];  Service: Open Heart Surgery;  Laterality: N/A;  CABG x  3 , using left internal mammary artery and right  leg greater saphenous vein harvested endoscopically     Family History  Problem Relation Age of Onset  . Scleroderma Mother     Living, 6089  . Heart disease Mother   . Diabetes Father     Living, 2389  . Hypertension Father   . Transient ischemic attack Father   . Hypertension Sister   . Hyperlipidemia Sister      History   Social History  . Marital Status: Single    Spouse Name: N/A    Number of Children: 0  . Years of Education: 14   Occupational History  . retired    Social History Main Topics  . Smoking status: Former Smoker -- 0.25 packs/day for 50 years    Types: Cigarettes    Quit date: 08/29/2013  . Smokeless tobacco: Not on file     Comment: currently smokes 1/5 ppd  . Alcohol Use: No  . Drug Use: No  . Sexual Activity: Not on file   Other Topics Concern  . Not on file   Social History Narrative   [name] grew up in [place]. He attended [employer] [place] for 2 years but did not graduate. He is single and does not have any children. He recently moved to [place] 7 months ago. He moved to the area for financial reasons. He retired and living on a fixed  income in [place] was quite a challenge. Lives alone in [place]. Retired Music therapist. He enjoys exercising. Enjoys walking. He also enjoys watching TV. He really loves to dance but has found there is no where to go dancing in [place].     Current Outpatient Prescriptions on File Prior to Visit  Medication Sig Dispense Refill  . ACCU-CHEK SOFTCLIX LANCETS lancets Use as instructed  100 each  5  . aspirin 325 MG tablet Take 1 tablet (325 mg total) by mouth daily.  30 tablet  11  . atorvastatin (LIPITOR) 80 MG tablet Take 1 tablet (80 mg total) by mouth daily at 6 PM.  30 tablet  11  . budesonide-formoterol (SYMBICORT) 160-4.5 MCG/ACT inhaler Inhale 2 puffs into the lungs 2 (two) times daily.  1 Inhaler  12  . carvedilol (COREG) 25 MG tablet Take 1 tablet (25 mg total) by mouth 2 (two) times daily with a  meal.      . glipiZIDE (GLUCOTROL) 5 MG tablet Take 1 tablet (5 mg total) by mouth daily before breakfast.  60 tablet  9  . glucose blood test strip Use as instructed to check blood sugar twice daily  100 each  5  . lisinopril (PRINIVIL,ZESTRIL) 20 MG tablet Take 1 tablet (20 mg total) by mouth daily.  30 tablet  11  . metFORMIN (GLUCOPHAGE) 500 MG tablet Take 500 mg by mouth 2 (two) times daily.      Marland Kitchen thiamine 100 MG tablet Take 1 tablet (100 mg total) by mouth daily.       No current facility-administered medications on file prior to visit.     Review of Systems  Constitutional: Negative.   Respiratory: Negative.   Cardiovascular: Negative.   Gastrointestinal: Negative.   Endocrine:       Has not been checking his blood glucose 2/2 glucometer had no batteries. He reports recently replacing batteries.  Genitourinary: Negative.   Musculoskeletal: Negative.        Walks 30 min every day  Neurological: Negative.   Psychiatric/Behavioral: Negative for suicidal ideas, hallucinations, behavioral problems, confusion, sleep disturbance, self-injury, decreased concentration and agitation. The patient is not nervous/anxious and is not hyperactive.        Objective:  BP 108/58  Pulse 76  Temp(Src) 98.2 F (36.8 C) (Oral)  Wt 166 lb (75.297 kg)  SpO2 96%   Physical Exam  Constitutional: He is oriented to person, place, and time. He appears well-developed and well-nourished. No distress.  HENT:  Head: Normocephalic and atraumatic.  Cardiovascular: Normal rate, regular rhythm and normal heart sounds.  Exam reveals no gallop.   No murmur heard. Pulmonary/Chest: Effort normal and breath sounds normal. No respiratory distress. He has no wheezes. He has no rales. He exhibits no tenderness.  Abdominal: Soft. Bowel sounds are normal.  Musculoskeletal: Normal range of motion. He exhibits no edema and no tenderness.  Neurological: He is alert and oriented to person, place, and time.  Coordination normal.  Skin: Skin is warm and dry.     Psychiatric: He has a normal mood and affect. His behavior is normal. Judgment and thought content normal.  Polite. Engaging in conversation. Appropriate response to questions.       Assessment & Plan:   1. Abnormal laboratory test Check CBC. Slowly has been improving since surgery. - CBC with Differential  2. DM (diabetes mellitus), type 2, uncontrolled Oral meds. Compliant. Check A1c. Will adjust meds accordingly. Stressed diet adherence.  - Hemoglobin A1c; Future -  Hemoglobin A1c

## 2013-10-10 NOTE — Progress Notes (Signed)
Pre visit review using our clinic review tool, if applicable. No additional management support is needed unless otherwise documented below in the visit note. 

## 2013-10-12 NOTE — Telephone Encounter (Signed)
Unread mychart message mailed to patient 

## 2013-10-17 ENCOUNTER — Telehealth: Payer: Self-pay | Admitting: *Deleted

## 2013-10-17 NOTE — Telephone Encounter (Signed)
Faxed referral info to [facility] 10/17/13

## 2013-10-24 ENCOUNTER — Telehealth: Payer: Self-pay

## 2013-10-24 ENCOUNTER — Telehealth: Payer: Self-pay | Admitting: *Deleted

## 2013-10-24 NOTE — Telephone Encounter (Signed)
OK to continue speech therapy as requested.

## 2013-10-24 NOTE — Telephone Encounter (Signed)
Relevant patient education assigned to patient using Emmi. ° °

## 2013-10-24 NOTE — Telephone Encounter (Signed)
Speech therapist called needing verbal orders to continue home health speech therapy for pt for 2 additional weeks

## 2013-10-25 NOTE — Telephone Encounter (Signed)
Notified therapist of approval

## 2013-10-26 ENCOUNTER — Ambulatory Visit: Payer: Self-pay | Admitting: Adult Health

## 2013-10-31 ENCOUNTER — Ambulatory Visit: Payer: Self-pay | Admitting: Adult Health

## 2013-11-02 ENCOUNTER — Ambulatory Visit (INDEPENDENT_AMBULATORY_CARE_PROVIDER_SITE_OTHER): Payer: No Typology Code available for payment source | Admitting: Cardiothoracic Surgery

## 2013-11-02 ENCOUNTER — Encounter: Payer: Self-pay | Admitting: Cardiothoracic Surgery

## 2013-11-02 VITALS — BP 147/87 | HR 77 | Resp 16 | Ht 70.5 in | Wt 165.0 lb

## 2013-11-02 DIAGNOSIS — Z951 Presence of aortocoronary bypass graft: Secondary | ICD-10-CM

## 2013-11-02 DIAGNOSIS — I2510 Atherosclerotic heart disease of native coronary artery without angina pectoris: Secondary | ICD-10-CM

## 2013-11-02 NOTE — Progress Notes (Signed)
PCP is Rey,Raquel, NP Referring Provider is Nahser, Deloris PingPhilip J, MD  Chief Complaint  Patient presents with  . Routine Post Op    1 month f/u s/p CABG    [facility]  patient returns for final postoperative visit after CABG x3 09/28/2013 for severe multivessel CAD and reduced LV function. Prior to CABG patient sustained a left body CVA. Patient did well after CABG and needed skilled nursing facility and rehabilitation prior to returning home. He is now doing well at home, driving, taking care of himself. He has no recurrent anginal symptoms or symptoms of CHF. The incisions are all well-healed. Unfortunately he is resumed smoking 3-5 cigarettes daily.   Past Medical History  Diagnosis Date  . Hypertension   . Diabetes mellitus without complication   . Stroke 2013, 2014  . Hyperlipidemia   . Tobacco abuse   . CHF (congestive heart failure)   . Short-term memory loss     from stroke  . Coronary artery disease   . A-fib     Past Surgical History  Procedure Laterality Date  . Lung biopsy      years ago  . Tee without cardioversion N/A 04/27/2013    Procedure: TRANSESOPHAGEAL ECHOCARDIOGRAM (TEE);  Surgeon: Vesta MixerPhilip J Nahser, MD;  [facility];  Service: Cardiovascular;  Laterality: N/A;  . Intraoperative transesophageal echocardiogram N/A 08/30/2013    Procedure: INTRAOPERATIVE TRANSESOPHAGEAL ECHOCARDIOGRAM;  Surgeon: Kerin PernaPeter Van Trigt, MD;  [facility];  Service: Open Heart Surgery;  Laterality: N/A;  . Cardiac catheterization    . Coronary artery bypass graft N/A 08/30/2013    Procedure: CORONARY ARTERY BYPASS GRAFTING (CABG);  Surgeon: Kerin PernaPeter Van Trigt, MD;  [facility];  Service: Open Heart Surgery;  Laterality: N/A;  CABG x  3 , using left internal mammary artery and right leg greater saphenous vein harvested endoscopically    Family History  Problem Relation Age of Onset  . Scleroderma Mother     Living, 3389  . Heart disease Mother   . Diabetes Father     Living, 6989  .  Hypertension Father   . Transient ischemic attack Father   . Hypertension Sister   . Hyperlipidemia Sister     Social History History  Substance Use Topics  . Smoking status: Former Smoker -- 0.25 packs/day for 50 years    Types: Cigarettes    Quit date: 08/29/2013  . Smokeless tobacco: Not on file     Comment: currently smokes 1/5 ppd  . Alcohol Use: No    Current Outpatient Prescriptions  Medication Sig Dispense Refill  . ACCU-CHEK SOFTCLIX LANCETS lancets Use as instructed  100 each  5  . aspirin 325 MG tablet Take 1 tablet (325 mg total) by mouth daily.  30 tablet  11  . atorvastatin (LIPITOR) 80 MG tablet Take 1 tablet (80 mg total) by mouth daily at 6 PM.  30 tablet  11  . budesonide-formoterol (SYMBICORT) 160-4.5 MCG/ACT inhaler Inhale 2 puffs into the lungs 2 (two) times daily.  1 Inhaler  12  . carvedilol (COREG) 25 MG tablet Take 1 tablet (25 mg total) by mouth 2 (two) times daily with a meal.      . glipiZIDE (GLUCOTROL) 5 MG tablet Take 1 tablet (5 mg total) by mouth daily before breakfast.  60 tablet  9  . glucose blood test strip Use as instructed to check blood sugar twice daily  100 each  5  . lisinopril (PRINIVIL,ZESTRIL) 20 MG tablet Take  1 tablet (20 mg total) by mouth daily.  30 tablet  11  . metFORMIN (GLUCOPHAGE) 500 MG tablet Take 500 mg by mouth 2 (two) times daily.      Marland Kitchen. thiamine 100 MG tablet Take 1 tablet (100 mg total) by mouth daily.       No current facility-administered medications for this visit.    No Known Allergies  Review of Systems generalized improved strength, improved appetite and sleep pattern. No significant musculoskeletal pain, not taking pain meds  BP 147/87  Pulse 77  Resp 16  Ht 5' 10.5" (1.791 m)  Wt 165 lb (74.844 kg)  BMI 23.33 kg/m2  SpO2 97% Physical Exam Alert and very pleasant appears to be doing well Lungs clear Heart rate regular without murmur Sternal and leg incision is well-healed No peripheral edema Slight  weakness of lower leg from old CVA  Diagnostic Tests: Chest x-ray last month shows clear lung fields  Impression: Excellent progress in recovery after multivessel CABG. Patient encouraged to continue current meds, diabetic control and heart healthy diet and importance of complete smoking cessation again emphasized.  Plan: Patient return to the care of his primary care physician and return as needed.

## 2013-12-16 ENCOUNTER — Encounter: Payer: Self-pay | Admitting: Cardiovascular Disease

## 2013-12-16 ENCOUNTER — Ambulatory Visit (INDEPENDENT_AMBULATORY_CARE_PROVIDER_SITE_OTHER): Payer: No Typology Code available for payment source | Admitting: Cardiovascular Disease

## 2013-12-16 VITALS — BP 136/80 | HR 64 | Ht 70.5 in | Wt 171.2 lb

## 2013-12-16 DIAGNOSIS — R079 Chest pain, unspecified: Secondary | ICD-10-CM

## 2013-12-16 DIAGNOSIS — I2510 Atherosclerotic heart disease of native coronary artery without angina pectoris: Secondary | ICD-10-CM

## 2013-12-16 DIAGNOSIS — I5022 Chronic systolic (congestive) heart failure: Secondary | ICD-10-CM | POA: Diagnosis not present

## 2013-12-16 DIAGNOSIS — I509 Heart failure, unspecified: Secondary | ICD-10-CM

## 2013-12-16 DIAGNOSIS — Z79899 Other long term (current) drug therapy: Secondary | ICD-10-CM

## 2013-12-16 NOTE — Assessment & Plan Note (Signed)
He seems to be doing very well from a heart failure standpoint. He's tolerating his medications well. He's back in the gym and working out.  His last ejection fraction was 30-35%. We will repeat his echocardiogram to make sure that his EF has improved. If his EF is still below 35%, we'll need to refer him for AICD.  Continue same medications. I'll see him again in 6 months.

## 2013-12-16 NOTE — Assessment & Plan Note (Signed)
A. status post coronary artery bypass grafting. I have reminded him of his 10 pound weight limit for the next month or so. He's not having any angina

## 2013-12-16 NOTE — Patient Instructions (Addendum)
Your physician has requested that you have an echocardiogram. Echocardiography is a painless test that uses sound waves to create images of your heart. It provides your doctor with information about the size and shape of your heart and how well your heart's chambers and valves are working. This procedure takes approximately one hour. There are no restrictions for this procedure.  BMET, Lipid & Liver at the time of Echo.  Follow up with Dr. Elease HashimotoNahser in 6 months.

## 2013-12-16 NOTE — Progress Notes (Signed)
Jerry Graham Date of Birth  [birth year]       [facility]    [place] [facility] [address], [address]  [address], [address] [place], [place]  [address]   [place], [place]  [id number] [phone]     [phone]   Fax  [phone]    Fax [phone]  Problem List: 1.  Chronic systolic CHF 2.  CVA 3.  Diabetes mellitus 4. Hyperlipidemia 5. Cigarette smoker 6. CAD- s/p Coronary artery bypass grafting x3 FEb. 24 ,2015.    (left internal mammary artery to LAD, saphenous vein graft to OM1, saphenous vein graft to OM2) . 7. Post op Atrial fib    History of Present Illness:  Jerry Graham is seen for followup today. He recently had a stroke about one month ago. Has formed a transesophageal echo which revealed moderate to severe left ventricular dysfunction with an ejection fraction of around 30-35%. He was found to have some spontaneous contrast but no evidence of thrombus. He is atrial septum was intact it was no evidence of PFO or ASD.  He presents today for followup.  He's been on lisinopril for quite some time. Carvedilol 6.25 mg twice a day was added after the diagnosis of congestive heart failure.  He has improved his diet. He was previously eating a lot of fast foods but now is consistently staying away from fast foods.    Dec. 8, 2014:  Jerry Graham is doing well.  He did have some CP with sexual intercourse.   He continues to have some shortness of breath.  September 26, 2013:  Jerry Graham has had CABG ( Feb. 24, 2015) since I last saw him.   Doing well.  Minimal CP.  Having some pain from the surgery.  Breathing OK No angina. Still at the nursing facility.   December 16, 2013:  Jerry Graham is doing well.  He has some musculoskelatal pain.  Is lifting weights.    Current Outpatient Prescriptions on File Prior to Visit  Medication Sig Dispense Refill  . atorvastatin (LIPITOR) 80 MG tablet Take 1 tablet (80 mg total) by mouth daily at 6 PM.  30 tablet  11  .  budesonide-formoterol (SYMBICORT) 160-4.5 MCG/ACT inhaler Inhale 2 puffs into the lungs 2 (two) times daily.  1 Inhaler  12  . carvedilol (COREG) 25 MG tablet Take 1 tablet (25 mg total) by mouth 2 (two) times daily with a meal.      . glipiZIDE (GLUCOTROL) 5 MG tablet Take 1 tablet (5 mg total) by mouth daily before breakfast.  60 tablet  9  . glucose blood test strip Use as instructed to check blood sugar twice daily  100 each  5  . lisinopril (PRINIVIL,ZESTRIL) 20 MG tablet Take 1 tablet (20 mg total) by mouth daily.  30 tablet  11  . metFORMIN (GLUCOPHAGE) 500 MG tablet Take 500 mg by mouth 2 (two) times daily.      Marland Kitchen. thiamine 100 MG tablet Take 1 tablet (100 mg total) by mouth daily.       No current facility-administered medications on file prior to visit.    No Known Allergies  Past Medical History  Diagnosis Date  . Hypertension   . Diabetes mellitus without complication   . Stroke 2013, 2014  . Hyperlipidemia   . Tobacco abuse   . CHF (congestive heart failure)   . Short-term memory loss     from stroke  . Coronary  artery disease   . A-fib     Past Surgical History  Procedure Laterality Date  . Lung biopsy      years ago  . Tee without cardioversion N/A 04/27/2013    Procedure: TRANSESOPHAGEAL ECHOCARDIOGRAM (TEE);  Surgeon: Vesta MixerPhilip J Josanne Boerema, MD;  [facility];  Service: Cardiovascular;  Laterality: N/A;  . Intraoperative transesophageal echocardiogram N/A 08/30/2013    Procedure: INTRAOPERATIVE TRANSESOPHAGEAL ECHOCARDIOGRAM;  Surgeon: Kerin PernaPeter Van Trigt, MD;  [facility];  Service: Open Heart Surgery;  Laterality: N/A;  . Cardiac catheterization    . Coronary artery bypass graft N/A 08/30/2013    Procedure: CORONARY ARTERY BYPASS GRAFTING (CABG);  Surgeon: Kerin PernaPeter Van Trigt, MD;  [facility];  Service: Open Heart Surgery;  Laterality: N/A;  CABG x  3 , using left internal mammary artery and right leg greater saphenous vein harvested endoscopically     History  Smoking status  . Current Some Day Smoker -- 0.25 packs/day for 50 years  . Types: Cigarettes  . Last Attempt to Quit: 08/29/2013  Smokeless tobacco  . Not on file    Comment: currently smokes 1/5 ppd    History  Alcohol Use No    Family History  Problem Relation Age of Onset  . Scleroderma Mother     Living, 4989  . Heart disease Mother   . Diabetes Father     Living, 6289  . Hypertension Father   . Transient ischemic attack Father   . Hypertension Sister   . Hyperlipidemia Sister     Reviw of Systems:  Reviewed in the HPI.  All other systems are negative.  Physical Exam: Blood pressure 136/80, pulse 64, height 5' 10.5" (1.791 m), weight 171 lb 4 oz (77.678 kg). General: Well developed, well nourished, in no acute distress.  Head: Normocephalic, atraumatic, sclera non-icteric, mucus membranes are moist,   Neck: Supple. Carotids are 2 + without bruits. No JVD   Lungs: Clear   Heart: RR, normal S1, s2 Abdomen: Soft, non-tender, non-distended with normal bowel sounds. Msk:  Strength and tone are normal  Extremities: No clubbing or cyanosis. No edema.  Distal pedal pulses are 2+ and equal Neuro: CN II - XII intact.  Alert and oriented X 3.  Speech is slightly slurred.  Gait is ok. Psych:  Normal   ECG: 12/16/2013: Normal sinus rhythm at 64 beats a minute. 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 His ST and T wave changes in anterolateral leads.  Assessment / Plan:

## 2013-12-16 NOTE — Assessment & Plan Note (Signed)
He only had atrial fibrillation associated with his surgery as far as I can tell. He's not had any recurrent episodes of A. fib. He has a recurrence she will need to consider starting him on coumadin or a NOAC.

## 2013-12-28 ENCOUNTER — Other Ambulatory Visit: Payer: Self-pay | Admitting: *Deleted

## 2013-12-28 MED ORDER — CARVEDILOL 25 MG PO TABS: 25.0000 mg | ORAL_TABLET | Freq: Two times a day (BID) | ORAL | Status: DC

## 2013-12-28 MED ORDER — ATORVASTATIN CALCIUM 80 MG PO TABS: 80.0000 mg | ORAL_TABLET | Freq: Every day | ORAL | Status: DC

## 2013-12-28 MED ORDER — LISINOPRIL 20 MG PO TABS: 20.0000 mg | ORAL_TABLET | Freq: Every day | ORAL | Status: DC

## 2013-12-28 NOTE — Telephone Encounter (Signed)
error 

## 2013-12-29 ENCOUNTER — Other Ambulatory Visit: Payer: Self-pay

## 2013-12-29 ENCOUNTER — Other Ambulatory Visit (INDEPENDENT_AMBULATORY_CARE_PROVIDER_SITE_OTHER): Payer: No Typology Code available for payment source

## 2013-12-29 ENCOUNTER — Ambulatory Visit (INDEPENDENT_AMBULATORY_CARE_PROVIDER_SITE_OTHER): Payer: No Typology Code available for payment source

## 2013-12-29 DIAGNOSIS — R079 Chest pain, unspecified: Secondary | ICD-10-CM | POA: Diagnosis not present

## 2013-12-29 DIAGNOSIS — I2510 Atherosclerotic heart disease of native coronary artery without angina pectoris: Secondary | ICD-10-CM

## 2013-12-29 DIAGNOSIS — I509 Heart failure, unspecified: Secondary | ICD-10-CM | POA: Diagnosis not present

## 2013-12-29 DIAGNOSIS — I5022 Chronic systolic (congestive) heart failure: Secondary | ICD-10-CM

## 2013-12-29 DIAGNOSIS — Z79899 Other long term (current) drug therapy: Secondary | ICD-10-CM

## 2014-01-04 ENCOUNTER — Telehealth: Payer: Self-pay | Admitting: *Deleted

## 2014-01-04 DIAGNOSIS — I5022 Chronic systolic (congestive) heart failure: Secondary | ICD-10-CM

## 2014-01-04 NOTE — Telephone Encounter (Signed)
Appt made for patient to see Dr. Graciela HusbandsKlein for possible ICD on 02/07/14 at 0945 am  Patient aware

## 2014-01-04 NOTE — Telephone Encounter (Signed)
Message copied by Fransico SettersBAUCOM, Mayank Teuscher E on Wed Jan 04, 2014  9:07 AM ------      Message from: Festus AloeRESPO, SHARON G      Created: Wed Jan 04, 2014  7:51 AM       Please review and contact patient. Thanks! ------

## 2014-01-17 ENCOUNTER — Encounter: Payer: Self-pay | Admitting: Adult Health

## 2014-01-17 ENCOUNTER — Ambulatory Visit (INDEPENDENT_AMBULATORY_CARE_PROVIDER_SITE_OTHER): Payer: Medicaid Other | Admitting: Adult Health

## 2014-01-17 ENCOUNTER — Other Ambulatory Visit: Payer: Self-pay | Admitting: Adult Health

## 2014-01-17 VITALS — BP 139/86 | HR 73 | Temp 97.5°F | Resp 14 | Wt 172.8 lb

## 2014-01-17 DIAGNOSIS — E118 Type 2 diabetes mellitus with unspecified complications: Secondary | ICD-10-CM

## 2014-01-17 DIAGNOSIS — I10 Essential (primary) hypertension: Secondary | ICD-10-CM

## 2014-01-17 DIAGNOSIS — E785 Hyperlipidemia, unspecified: Secondary | ICD-10-CM

## 2014-01-17 LAB — BASIC METABOLIC PANEL
BUN: 12 mg/dL (ref 6–23)
CALCIUM: 9.3 mg/dL (ref 8.4–10.5)
CHLORIDE: 106 meq/L (ref 96–112)
CO2: 27 mEq/L (ref 19–32)
Creatinine, Ser: 1 mg/dL (ref 0.4–1.5)
GFR: 102.49 mL/min (ref >60.00)
Glucose, Bld: 230 mg/dL — ABNORMAL HIGH (ref 70–99)
Potassium: 4 mEq/L (ref 3.5–5.1)
Sodium: 138 mEq/L (ref 135–145)

## 2014-01-17 LAB — HEPATIC FUNCTION PANEL
ALT: 42 U/L (ref 0–53)
AST: 38 U/L — ABNORMAL HIGH (ref 0–37)
Albumin: 3.7 g/dL (ref 3.5–5.2)
Alkaline Phosphatase: 77 U/L (ref 39–117)
Bilirubin, Direct: 0.1 mg/dL (ref 0.0–0.3)
Total Bilirubin: 0.6 mg/dL (ref 0.2–1.2)
Total Protein: 6.9 g/dL (ref 6.0–8.3)

## 2014-01-17 LAB — HEMOGLOBIN A1C: HEMOGLOBIN A1C: 8.9 % — AB (ref 4.6–6.5)

## 2014-01-17 MED ORDER — GLIPIZIDE 5 MG PO TABS: 5.0000 mg | ORAL_TABLET | Freq: Two times a day (BID) | ORAL | Status: DC

## 2014-01-17 MED ORDER — METFORMIN HCL 1000 MG PO TABS: 1000.0000 mg | ORAL_TABLET | Freq: Two times a day (BID) | ORAL | Status: DC

## 2014-01-17 NOTE — Progress Notes (Signed)
Pre visit review using our clinic review tool, if applicable. No additional management support is needed unless otherwise documented below in the visit note. 

## 2014-01-17 NOTE — Progress Notes (Signed)
Patient ID: Jerry Graham, male   DOB: [birth year], 63 y.o.   MRN: [id number]   Subjective:    Patient ID: Jerry Graham, male    DOB: 10/15/1950, 63 y.o.   MRN: [id number]  HPI  Pt is a pleasant 63 y/o male who presents for DM follow up. His last A1c was 7% down for 9.5%. He is going to the [facility] regularly and exercising with weights and doing cardio. Reports compliance with medications. Last visit to his cardiologist pt was not able to have labs drawn. He reports that the [facility] he would normally go to have blood work done was closed. We will check the labs here today except for lipids 2/2 not fasting.  Past Medical History  Diagnosis Date  . Hypertension   . Diabetes mellitus without complication   . Stroke 2013, 2014  . Hyperlipidemia   . Tobacco abuse   . CHF (congestive heart failure)   . Short-term memory loss     from stroke  . Coronary artery disease   . A-fib     Current Outpatient Prescriptions on File Prior to Visit  Medication Sig Dispense Refill  . aspirin 81 MG tablet Take 81 mg by mouth daily.      Marland Kitchen atorvastatin (LIPITOR) 80 MG tablet Take 1 tablet (80 mg total) by mouth daily at 6 PM.  30 tablet  5  . budesonide-formoterol (SYMBICORT) 160-4.5 MCG/ACT inhaler Inhale 2 puffs into the lungs 2 (two) times daily.  1 Inhaler  12  . carvedilol (COREG) 25 MG tablet Take 1 tablet (25 mg total) by mouth 2 (two) times daily with a meal.  60 tablet  5  . glipiZIDE (GLUCOTROL) 5 MG tablet Take 1 tablet (5 mg total) by mouth daily before breakfast.  60 tablet  9  . glucose blood test strip Use as instructed to check blood sugar twice daily  100 each  5  . lisinopril (PRINIVIL,ZESTRIL) 20 MG tablet Take 1 tablet (20 mg total) by mouth daily.  30 tablet  5  . metFORMIN (GLUCOPHAGE) 500 MG tablet Take 500 mg by mouth 2 (two) times daily.      Marland Kitchen thiamine 100 MG tablet Take 1 tablet (100 mg total) by mouth daily.       No current facility-administered medications on file prior to  visit.     Review of Systems  Constitutional: Negative.   Respiratory: Negative.  Negative for cough, chest tightness and shortness of breath.   Cardiovascular: Negative.  Negative for chest pain, palpitations and leg swelling.  Genitourinary: Negative.   Musculoskeletal: Negative.        Going to the [facility] regularly  Neurological: Negative for dizziness and light-headedness.  Psychiatric/Behavioral: Negative.   All other systems reviewed and are negative.      Objective:  BP 139/86  Pulse 73  Temp(Src) 97.5 F (36.4 C) (Oral)  Resp 14  Wt 172 lb 12 oz (78.359 kg)  SpO2 96%   Physical Exam  Constitutional: He is oriented to person, place, and time. He appears well-developed and well-nourished. No distress.  HENT:  Head: Normocephalic and atraumatic.  Eyes: Conjunctivae and EOM are normal.  Cardiovascular: Normal rate, regular rhythm, normal heart sounds and intact distal pulses.  Exam reveals no gallop and no friction rub.   No murmur heard. Pulmonary/Chest: Effort normal and breath sounds normal. No respiratory distress. He has no wheezes. He has no rales.  Musculoskeletal: Normal range of motion.  Neurological: He is alert  and oriented to person, place, and time.  Skin: Skin is warm and dry.  Psychiatric: He has a normal mood and affect. His behavior is normal. Judgment and thought content normal.      Assessment & Plan:   1. Type 2 diabetes mellitus with complication Check A1c today. He does not have any readings with him. Has been exercising regularly.  - Hemoglobin A1c  2. HLD (hyperlipidemia) Will check his liver function. [facility] after last visit with his Cardiologist - Hepatic function panel  3. Essential hypertension Check metabolic panel. Compliant with meds. Exercising. - Basic metabolic panel

## 2014-01-18 ENCOUNTER — Telehealth: Payer: Self-pay | Admitting: Adult Health

## 2014-01-18 ENCOUNTER — Ambulatory Visit: Payer: Self-pay | Admitting: Adult Health

## 2014-01-18 NOTE — Telephone Encounter (Signed)
Relevant patient education assigned to patient using Emmi. ° °

## 2014-01-19 NOTE — Telephone Encounter (Signed)
Mailed unread message to pt  

## 2014-02-02 ENCOUNTER — Other Ambulatory Visit: Payer: Self-pay | Admitting: *Deleted

## 2014-02-02 ENCOUNTER — Encounter: Payer: Self-pay | Admitting: *Deleted

## 2014-02-02 DIAGNOSIS — E785 Hyperlipidemia, unspecified: Secondary | ICD-10-CM

## 2014-02-06 ENCOUNTER — Ambulatory Visit: Payer: No Typology Code available for payment source | Admitting: Neurology

## 2014-02-06 NOTE — Telephone Encounter (Signed)
Mailed unread message to pt  

## 2014-02-07 ENCOUNTER — Encounter: Payer: Self-pay | Admitting: *Deleted

## 2014-02-07 ENCOUNTER — Ambulatory Visit: Payer: No Typology Code available for payment source | Admitting: Internal Medicine

## 2014-02-08 ENCOUNTER — Telehealth: Payer: Self-pay | Admitting: Neurology

## 2014-02-08 NOTE — Telephone Encounter (Signed)
Pt no showed 02/06/14 appt w/ Dr. Allena KatzPatel. No show letter mailed to pt / Sherri S.

## 2014-02-13 ENCOUNTER — Ambulatory Visit: Payer: No Typology Code available for payment source | Admitting: Adult Health

## 2014-02-28 ENCOUNTER — Encounter: Payer: Self-pay | Admitting: Adult Health

## 2014-02-28 ENCOUNTER — Ambulatory Visit (INDEPENDENT_AMBULATORY_CARE_PROVIDER_SITE_OTHER): Payer: No Typology Code available for payment source | Admitting: Adult Health

## 2014-02-28 VITALS — BP 140/82 | HR 68 | Temp 97.6°F | Resp 14 | Ht 69.0 in | Wt 172.0 lb

## 2014-02-28 DIAGNOSIS — E119 Type 2 diabetes mellitus without complications: Secondary | ICD-10-CM

## 2014-02-28 DIAGNOSIS — Z1211 Encounter for screening for malignant neoplasm of colon: Secondary | ICD-10-CM

## 2014-02-28 DIAGNOSIS — Z0100 Encounter for examination of eyes and vision without abnormal findings: Secondary | ICD-10-CM | POA: Diagnosis not present

## 2014-02-28 NOTE — Progress Notes (Signed)
Pre visit review using our clinic review tool, if applicable. No additional management support is needed unless otherwise documented below in the visit note. 

## 2014-02-28 NOTE — Progress Notes (Signed)
Patient ID: Jerry Graham, male   DOB: [birth year], 63 y.o.   MRN: [id number]   Subjective:    Patient ID: Jerry Graham, male    DOB: [birth year], 63 y.o.   MRN: [id number]  HPI  Pt is a pleasant 63 y/o male with hx of CVA, diabetes type 2 controlled, HTN, HLD, CAD who presents to clinic for follow up:  1. Diabetes type 2, controlled He is taking his medication as prescribed and watching diet. Few dietary indiscretions. A1c is at goal. No hypoglycemic events. Needs diabetic foot exam.  2. Screen for colon cancer Was scheduled to have a screening colonoscopy in [place] prior to moving to [place]. He reports that he did not make it to appointment. Agreeable to referral.  3. Diabetic eye exam Has not had a diabetic eye exam. Notices some changes in vision although still feels he has good vision.    Past Medical History  Diagnosis Date  . Hypertension   . Diabetes mellitus without complication   . Stroke 2013, 2014  . Hyperlipidemia   . Tobacco abuse   . CHF (congestive heart failure)   . Short-term memory loss     from stroke  . Coronary artery disease   . A-fib     Current Outpatient Prescriptions on File Prior to Visit  Medication Sig Dispense Refill  . aspirin 81 MG tablet Take 81 mg by mouth daily.      Marland Kitchen atorvastatin (LIPITOR) 80 MG tablet Take 1 tablet (80 mg total) by mouth daily at 6 PM.  30 tablet  5  . budesonide-formoterol (SYMBICORT) 160-4.5 MCG/ACT inhaler Inhale 2 puffs into the lungs 2 (two) times daily.  1 Inhaler  12  . carvedilol (COREG) 25 MG tablet Take 1 tablet (25 mg total) by mouth 2 (two) times daily with a meal.  60 tablet  5  . glipiZIDE (GLUCOTROL) 5 MG tablet Take 1 tablet (5 mg total) by mouth 2 (two) times daily before a meal.  60 tablet  3  . glucose blood test strip Use as instructed to check blood sugar twice daily  100 each  5  . lisinopril (PRINIVIL,ZESTRIL) 20 MG tablet Take 1 tablet (20 mg total) by mouth daily.  30 tablet  5  . metFORMIN  (GLUCOPHAGE) 1000 MG tablet Take 1 tablet (1,000 mg total) by mouth 2 (two) times daily with a meal.  60 tablet  3  . thiamine 100 MG tablet Take 1 tablet (100 mg total) by mouth daily.       No current facility-administered medications on file prior to visit.     Review of Systems  Constitutional: Negative for fatigue.  Respiratory: Negative for cough, shortness of breath and wheezing.   Cardiovascular: Negative for chest pain, palpitations and leg swelling.  Gastrointestinal: Negative.   Musculoskeletal: Negative.   Skin: Negative.   Neurological: Negative for dizziness, speech difficulty, light-headedness and numbness.  Psychiatric/Behavioral: Negative.   All other systems reviewed and are negative.      Objective:  BP 140/82  Pulse 68  Temp(Src) 97.6 F (36.4 C) (Oral)  Resp 14  Ht  (1.753 m)  Wt 172 lb (78.019 kg)  BMI 25.39 kg/m2  SpO2 95%   Physical Exam  Constitutional: He is oriented to person, place, and time. He appears well-developed and well-nourished. No distress.  HENT:  Head: Normocephalic and atraumatic.  Eyes: Conjunctivae and EOM are normal.  Cardiovascular: Normal rate, regular rhythm, normal heart sounds and intact  distal pulses.  Exam reveals no gallop and no friction rub.   No murmur heard. Pulmonary/Chest: Effort normal and breath sounds normal. No respiratory distress. He has no wheezes. He has no rales.  Musculoskeletal: Normal range of motion.  Neurological: He is alert and oriented to person, place, and time.  Skin:  Diabetic foot exam - no skin breakdown. Sensation intact. Onychomycosis of bilateral great toenails.  Psychiatric: He has a normal mood and affect. His behavior is normal. Judgment and thought content normal.      Assessment & Plan:   1. Diabetes type 2, controlled Continue medication. Exercise - walks daily. A1c at goal. Diabetic foot exam done today. Sensation, pulses intact. He has onychomycosis of bilateral great  toenails. The nails are so thick they are sometimes hard to trim. Advised to soak for few minutes prior to clipping nails. Needs to dry feet well and inspect regularly. Refer to podiatry if necessary. He does not feel he needs referral at this time. Follow  2. Screen for colon cancer Refer to [facility] for screening colonoscopy. - Ambulatory referral to Gastroenterology  3. Diabetic eye exam Refer to [facility] center for diabetic eye exam. - Ambulatory referral to Ophthalmology

## 2014-02-28 NOTE — Patient Instructions (Signed)
  I am referring you to GI for screening colonoscopy. The GI office will call you to schedule an appointment.  I am referring you to [facility] for your yearly diabetic eye exam.  I recommend that you get the Pneumonia vaccine and the flu vaccine. Please consider this.  Follow up appt in January for diabetes. Please schedule today before leaving

## 2014-03-10 LAB — HM DIABETES EYE EXAM

## 2014-05-18 ENCOUNTER — Ambulatory Visit: Payer: Self-pay | Admitting: Gastroenterology

## 2014-05-31 ENCOUNTER — Ambulatory Visit: Payer: Medicaid Other | Admitting: Internal Medicine

## 2014-06-15 ENCOUNTER — Encounter (HOSPITAL_COMMUNITY): Payer: Self-pay | Admitting: Cardiovascular Disease

## 2014-06-19 ENCOUNTER — Ambulatory Visit (INDEPENDENT_AMBULATORY_CARE_PROVIDER_SITE_OTHER): Payer: Medicaid Other | Admitting: Cardiovascular Disease

## 2014-06-19 ENCOUNTER — Encounter: Payer: Self-pay | Admitting: Cardiovascular Disease

## 2014-06-19 DIAGNOSIS — I4891 Unspecified atrial fibrillation: Secondary | ICD-10-CM

## 2014-06-19 DIAGNOSIS — I2510 Atherosclerotic heart disease of native coronary artery without angina pectoris: Secondary | ICD-10-CM

## 2014-06-19 NOTE — Patient Instructions (Signed)
Your physician recommends that you continue on your current medications as directed. Please refer to the Current Medication list given to you today.  Your physician recommends that you have labs today: Lipid and Liver profile   Your physician wants you to follow-up in: 6 months with Dr. Elease HashimotoNahser. You will receive a reminder letter in the mail two months in advance. If you don't receive a letter, please call our office to schedule the follow-up appointment.

## 2014-06-19 NOTE — Assessment & Plan Note (Signed)
He is doing well. No angina Still smoking some I've avised him to stop smoking adn to continue to exercise. Will check fasting labs today.   Will see him in 6 months.

## 2014-06-19 NOTE — Progress Notes (Signed)
Jerry Graham Date of Birth  Jul 10, 1950       [facility]    [place] [facility] [address]  [address] [place], [place]  [address]   [place], [place]  [id number] [phone]     [phone]   Fax  [phone]    Fax [phone]  Problem List: 1.  Chronic systolic CHF 2.  CVA 3.  Diabetes mellitus 4. Hyperlipidemia 5. Cigarette smoker 6. CAD- s/p Coronary artery bypass grafting x3 FEb. 24 ,2015.    (left internal mammary artery to LAD, saphenous vein graft to OM1, saphenous vein graft to OM2) . 7. Post op Atrial fib    History of Present Illness:  [name] is seen for followup today. He recently had a stroke about one month ago. Has formed a transesophageal echo which revealed moderate to severe left ventricular dysfunction with an ejection fraction of around 30-35%. He was found to have some spontaneous contrast but no evidence of thrombus. He is atrial septum was intact it was no evidence of PFO or ASD.  He presents today for followup.  He's been on lisinopril for quite some time. Carvedilol 6.25 mg twice a day was added after the diagnosis of congestive heart failure.  He has improved his diet. He was previously eating a lot of fast foods but now is consistently staying away from fast foods.    Dec. 8, 2014:  [name] is doing well.  He did have some CP with sexual intercourse.   He continues to have some shortness of breath.  September 26, 2013:  [name] has had CABG ( Feb. 24, 2015) since I last saw him.   Doing well.  Minimal CP.  Having some pain from the surgery.  Breathing OK No angina. Still at the nursing facility.   December 16, 2013:  Jerry Graham is doing well.  He has some musculoskelatal pain.  Is lifting weights.    Dec. 14 , 2015  Jerry Graham is a 63 yo who we follow for CAD ( s/p CABG) , chronic systolic CHF, CVA, hyperlipidemia. Still smoking 7-8 cigarettes a day.    Current Outpatient Prescriptions on File  Prior to Visit  Medication Sig Dispense Refill  . aspirin 81 MG tablet Take 81 mg by mouth daily.    Marland Kitchen. atorvastatin (LIPITOR) 80 MG tablet Take 1 tablet (80 mg total) by mouth daily at 6 PM. 30 tablet 5  . budesonide-formoterol (SYMBICORT) 160-4.5 MCG/ACT inhaler Inhale 2 puffs into the lungs 2 (two) times daily. 1 Inhaler 12  . carvedilol (COREG) 25 MG tablet Take 1 tablet (25 mg total) by mouth 2 (two) times daily with a meal. 60 tablet 5  . glipiZIDE (GLUCOTROL) 5 MG tablet Take 1 tablet (5 mg total) by mouth 2 (two) times daily before a meal. 60 tablet 3  . glucose blood test strip Use as instructed to check blood sugar twice daily 100 each 5  . lisinopril (PRINIVIL,ZESTRIL) 20 MG tablet Take 1 tablet (20 mg total) by mouth daily. 30 tablet 5  . metFORMIN (GLUCOPHAGE) 1000 MG tablet Take 1 tablet (1,000 mg total) by mouth 2 (two) times daily with a meal. 60 tablet 3  . thiamine 100 MG tablet Take 1 tablet (100 mg total) by mouth daily.     No current facility-administered medications on file prior to visit.    No Known Allergies  Past Medical History  Diagnosis Date  . Hypertension   .  Diabetes mellitus without complication   . Stroke 2013, 2014  . Hyperlipidemia   . Tobacco abuse   . CHF (congestive heart failure)   . Short-term memory loss     from stroke  . Coronary artery disease   . A-fib     Past Surgical History  Procedure Laterality Date  . Lung biopsy      years ago  . Tee without cardioversion N/A 04/27/2013    Procedure: TRANSESOPHAGEAL ECHOCARDIOGRAM (TEE);  Surgeon: Vesta MixerPhilip J Randol Zumstein, MD;  [facility];  Service: Cardiovascular;  Laterality: N/A;  . Intraoperative transesophageal echocardiogram N/A 08/30/2013    Procedure: INTRAOPERATIVE TRANSESOPHAGEAL ECHOCARDIOGRAM;  Surgeon: Kerin PernaPeter Van Trigt, MD;  [facility];  Service: Open Heart Surgery;  Laterality: N/A;  . Cardiac catheterization    . Coronary artery bypass graft N/A 08/30/2013    Procedure:  CORONARY ARTERY BYPASS GRAFTING (CABG);  Surgeon: Kerin PernaPeter Van Trigt, MD;  [facility];  Service: Open Heart Surgery;  Laterality: N/A;  CABG x  3 , using left internal mammary artery and right leg greater saphenous vein harvested endoscopically  . Left heart catheterization with coronary angiogram N/A 06/16/2013    Procedure: LEFT HEART CATHETERIZATION WITH CORONARY ANGIOGRAM;  Surgeon: Alvia GroveJr Venice Liz J Gabryel Files, MD;  [facility];  Service: Cardiovascular;  Laterality: N/A;    History  Smoking status  . Current Some Day Smoker -- 0.25 packs/day for 50 years  . Types: Cigarettes  . Last Attempt to Quit: 08/29/2013  Smokeless tobacco  . Not on file    Comment: currently smokes 1/5 ppd    History  Alcohol Use No    Family History  Problem Relation Age of Onset  . Scleroderma Mother     Living, 4289  . Heart disease Mother   . Diabetes Father     Living, 1289  . Hypertension Father   . Transient ischemic attack Father   . Hypertension Sister   . Hyperlipidemia Sister     Reviw of Systems:  Reviewed in the HPI.  All other systems are negative.  Physical Exam: Blood pressure 138/70, pulse 72, height 5' 9.5" (1.765 m), weight 174 lb 4 oz (79.039 kg). General: Well developed, well nourished, in no acute distress.  Head: Normocephalic, atraumatic, sclera non-icteric, mucus membranes are moist,   Neck: Supple. Carotids are 2 + without bruits. No JVD   Lungs: Clear   Heart: RR, normal S1, s2 Abdomen: Soft, non-tender, non-distended with normal bowel sounds. Msk:  Strength and tone are normal  Extremities: No clubbing or cyanosis. No edema.  Distal pedal pulses are 2+ and equal Neuro: CN II - XII intact.  Alert and oriented X 3.  Speech is slightly slurred.  Gait is ok. Psych:  Normal   ECG: Dec. 14, 2015:  NSR at 70.  Occasional ectopic ventricular beats, LVH. [place]  Assessment / Plan:

## 2014-06-19 NOTE — Assessment & Plan Note (Signed)
Symptoms are stable Continue meds

## 2014-06-28 ENCOUNTER — Encounter: Payer: Self-pay | Admitting: Adult Health

## 2014-09-22 ENCOUNTER — Other Ambulatory Visit: Payer: Self-pay | Admitting: *Deleted

## 2014-09-22 MED ORDER — METFORMIN HCL 1000 MG PO TABS: 1000.0000 mg | ORAL_TABLET | Freq: Two times a day (BID) | ORAL | Status: DC

## 2014-09-22 MED ORDER — GLIPIZIDE 5 MG PO TABS: 5.0000 mg | ORAL_TABLET | Freq: Two times a day (BID) | ORAL | Status: DC

## 2014-09-28 ENCOUNTER — Encounter: Payer: Self-pay | Admitting: Nurse Practitioner

## 2014-11-06 ENCOUNTER — Other Ambulatory Visit: Payer: Self-pay | Admitting: *Deleted

## 2014-11-06 MED ORDER — GLIPIZIDE 5 MG PO TABS: 5.0000 mg | ORAL_TABLET | Freq: Two times a day (BID) | ORAL | Status: DC

## 2014-11-06 NOTE — Telephone Encounter (Signed)
[facility] called [facility] on need for appointment. Appt scheduled 11/16/14 and advised to come fasting for labs as well,  verbalized understanding

## 2014-11-16 ENCOUNTER — Ambulatory Visit: Payer: No Typology Code available for payment source | Admitting: Nurse Practitioner

## 2014-11-23 ENCOUNTER — Encounter: Payer: Self-pay | Admitting: Cardiovascular Disease

## 2014-11-23 ENCOUNTER — Ambulatory Visit (INDEPENDENT_AMBULATORY_CARE_PROVIDER_SITE_OTHER): Payer: Medicare Other | Admitting: Cardiovascular Disease

## 2014-11-23 VITALS — BP 120/86 | HR 76 | Ht 70.5 in | Wt 169.5 lb

## 2014-11-23 DIAGNOSIS — I4891 Unspecified atrial fibrillation: Secondary | ICD-10-CM

## 2014-11-23 DIAGNOSIS — E785 Hyperlipidemia, unspecified: Secondary | ICD-10-CM

## 2014-11-23 DIAGNOSIS — I739 Peripheral vascular disease, unspecified: Secondary | ICD-10-CM | POA: Diagnosis not present

## 2014-11-23 NOTE — Progress Notes (Signed)
Cardiology Office Note   Date:  11/23/2014   ID:  Jerry Graham, DOB 16-Aug-1950, MRN [id number]  PCP:  Jerry Oliveey, Raquel M, NP  Cardiologist:   Jerry Graham, Jerry Macgregor J, MD   Chief Complaint  Patient presents with  . Coronary Artery Disease    6 month follow up. Meds reviewed by the patient verbally. pt. c/o mild chest discomfort at times.    1. Chronic systolic CHF 2. CVA 3. Diabetes mellitus 4. Hyperlipidemia 5. Cigarette smoker 6. CAD- s/p Coronary artery bypass grafting x3 FEb. 24 ,2015. (left internal mammary artery to LAD, saphenous vein graft to OM1, saphenous vein graft to OM2) . 7. Post op Atrial fib    History of Present Illness:  Jerry Graham is seen for followup today. He recently had a stroke about one month ago. Has formed a transesophageal echo which revealed moderate to severe left ventricular dysfunction with an ejection fraction of around 30-35%. He was found to have some spontaneous contrast but no evidence of thrombus. He is atrial septum was intact it was no evidence of PFO or ASD.  He presents today for followup. He's been on lisinopril for quite some time. Carvedilol 6.25 mg twice a day was added after the diagnosis of congestive heart failure.  He has improved his diet. He was previously eating a lot of fast foods but now is consistently staying away from fast foods.   Dec. 8, 2014:  Jerry Graham is doing well. He did have some CP with sexual intercourse. He continues to have some shortness of breath.  September 26, 2013:  Jerry Graham has had CABG ( Feb. 24, 2015) since I last saw him. Doing well. Minimal CP. Having some pain from the surgery. Breathing OK No angina. Still at the nursing facility.   December 16, 2013:  Jerry Graham is doing well. He has some musculoskelatal pain. Is lifting weights.   Dec. 14 , 2015  Jerry Graham is a 64 yo who we follow for CAD ( s/p CABG) , chronic systolic CHF, CVA, hyperlipidemia. Still smoking 7-8 cigarettes a day.      Nov 23, 2014:  Jerry Graham is a 64 y.o. male who presents for  Follow up of his CAD  Feeling well,  No co.  Has some claudication in left leg with walking .  Still smoking    Past Medical History  Diagnosis Date  . Hypertension   . Diabetes mellitus without complication   . Stroke 2013, 2014  . Hyperlipidemia   . Tobacco abuse   . CHF (congestive heart failure)   . Short-term memory loss     from stroke  . Coronary artery disease   . A-fib     Past Surgical History  Procedure Laterality Date  . Lung biopsy      years ago  . Tee without cardioversion N/A 04/27/2013    Procedure: TRANSESOPHAGEAL ECHOCARDIOGRAM (TEE);  Surgeon: Jerry MixerPhilip Graham Jamorion Gomillion, MD;  [facility];  Service: Cardiovascular;  Laterality: N/A;  . Intraoperative transesophageal echocardiogram N/A 08/30/2013    Procedure: INTRAOPERATIVE TRANSESOPHAGEAL ECHOCARDIOGRAM;  Surgeon: Kerin PernaPeter Van Trigt, MD;  [facility];  Service: Open Heart Surgery;  Laterality: N/A;  . Cardiac catheterization    . Coronary artery bypass graft N/A 08/30/2013    Procedure: CORONARY ARTERY BYPASS GRAFTING (CABG);  Surgeon: Kerin PernaPeter Van Trigt, MD;  [facility];  Service: Open Heart Surgery;  Laterality: N/A;  CABG x  3 , using left internal mammary artery and right leg  greater saphenous vein harvested endoscopically  . Left heart catheterization with coronary angiogram N/A 06/16/2013    Procedure: LEFT HEART CATHETERIZATION WITH CORONARY ANGIOGRAM;  Surgeon: Alvia Grove, MD;  Location: [facility] CATH LAB;  Service: Cardiovascular;  Laterality: N/A;     Current Outpatient Prescriptions  Medication Sig Dispense Refill  . aspirin 81 MG tablet Take 81 mg by mouth daily.    Marland Kitchen atorvastatin (LIPITOR) 80 MG tablet Take 1 tablet (80 mg total) by mouth daily at 6 PM. 30 tablet 5  . budesonide-formoterol (SYMBICORT) 160-4.5 MCG/ACT inhaler Inhale 2 puffs into the lungs 2 (two) times daily. 1 Inhaler 12  . carvedilol (COREG) 25 MG tablet  Take 1 tablet (25 mg total) by mouth 2 (two) times daily with a meal. 60 tablet 5  . glipiZIDE (GLUCOTROL) 5 MG tablet Take 1 tablet (5 mg total) by mouth 2 (two) times daily before a meal. 60 tablet 0  . glucose blood test strip Use as instructed to check blood sugar twice daily 100 each 5  . lisinopril (PRINIVIL,ZESTRIL) 20 MG tablet Take 1 tablet (20 mg total) by mouth daily. 30 tablet 5  . metFORMIN (GLUCOPHAGE) 1000 MG tablet Take 1 tablet (1,000 mg total) by mouth 2 (two) times daily with a meal. 60 tablet 0  . thiamine 100 MG tablet Take 1 tablet (100 mg total) by mouth daily.     No current facility-administered medications for this visit.    Allergies:   Review of patient's allergies indicates no known allergies.    Social History:  The patient  reports that he has been smoking Cigarettes.  He has a 12.5 pack-year smoking history. He does not have any smokeless tobacco history on file. He reports that he does not drink alcohol or use illicit drugs.   Family History:  The patient's family history includes Diabetes in his father; Heart disease in his mother; Hyperlipidemia in his sister; Hypertension in his father and sister; Scleroderma in his mother; Transient ischemic attack in his father.    ROS:  Please see the history of present illness.    Review of Systems: Constitutional:  denies fever, chills, diaphoresis, appetite change and fatigue.  HEENT: denies photophobia, eye pain, redness, hearing loss, ear pain, congestion, sore throat, rhinorrhea, sneezing, neck pain, neck stiffness and tinnitus.  Respiratory: denies SOB, DOE, cough, chest tightness, and wheezing.  Cardiovascular: denies chest pain, palpitations and leg swelling.  Gastrointestinal: denies nausea, vomiting, abdominal pain, diarrhea, constipation, blood in stool.  Genitourinary: denies dysuria, urgency, frequency, hematuria, flank pain and difficulty urinating.  Musculoskeletal: denies  myalgias, back pain, joint  swelling, arthralgias and gait problem.   Skin: denies pallor, rash and wound.  Neurological: denies dizziness, seizures, syncope, weakness, light-headedness, numbness and headaches.   Hematological: denies adenopathy, easy bruising, personal or family bleeding history.  Psychiatric/ Behavioral: denies suicidal ideation, mood changes, confusion, nervousness, sleep disturbance and agitation.       All other systems are reviewed and negative.    PHYSICAL EXAM: VS:  BP 120/86 mmHg  Pulse 76  Ht 5' 10.5" (1.791 Graham)  Wt 76.885 kg (169 lb 8 oz)  BMI 23.97 kg/m2 , BMI Body mass index is 23.97 kg/(Graham^2). GEN: Well nourished, well developed, in no acute distress HEENT: normal Neck: no JVD, carotid bruits, or masses Cardiac: RRR; no murmurs, rubs, or gallops,no edema  Respiratory:  clear to auscultation bilaterally, normal work of breathing GI: soft, nontender, nondistended, + BS MS: no deformity or  atrophy, reduced pulses in feet.  Skin: warm and dry, no rash Neuro:  Strength and sensation are intact Psych: normal   EKG:  EKG is ordered today. The ekg ordered today demonstrates NSR at 76.  Poor R wave progression,  TWI in the lateral leads    Recent Labs: 01/17/2014: ALT 42; BUN 12; Creatinine 1.0; Potassium 4.0; Sodium 138    Lipid Panel    Component Value Date/Time   CHOL 275* 03/31/2013 0620   TRIG 202* 03/31/2013 0620   HDL 38* 03/31/2013 0620   CHOLHDL 7.2 03/31/2013 0620   VLDL 40 03/31/2013 0620   LDLCALC 197* 03/31/2013 0620      Wt Readings from Last 3 Encounters:  11/23/14 76.885 kg (169 lb 8 oz)  06/19/14 79.039 kg (174 lb 4 oz)  02/28/14 78.019 kg (172 lb)      Other studies Reviewed: Additional studies/ records that were reviewed today include: . Review of the above records demonstrates:    ASSESSMENT AND PLAN:  1. Chronic systolic CHF - he's doing well. His breathing is okay. 2. CVA - overall seems to be doing very well. 3. Diabetes mellitus - he  has not seen his general medical doctor in quite some time. I've encouraged him to make another appointment. 4. Hyperlipidemia - we'll check fasting lipids, liver enzymes and basic medical profile today. I'll see him again in 6 months will check fasting lipids, liver enzymes, and basic medical profile at that time. 5. Cigarette smoker 6. CAD- s/p Coronary artery bypass grafting x3 FEb. 24 ,2015. (left internal mammary artery to LAD, saphenous vein graft to OM1, saphenous vein graft to OM2) . 7. Post op Atrial fib - he's not had any further episodes of atrial fibrillation. 8. Possible peripheral vascular disease. He has a long history of cigarette smoking. His pulses are reduced. We'll get lower extremity duplex scan   Current medicines are reviewed at length with the patient today.  The patient does not have concerns regarding medicines.  The following changes have been made:  no change  Labs/ tests ordered today include:  Orders Placed This Encounter  Procedures  . EKG 12-Lead     Disposition:   FU with me in 6 months      Kaiyla Stahly, Deloris PingPhilip J, MD  11/23/2014 11:38 AM    [facility] [address], [place], [place]  [address] Phone: [phone]; Fax: [phone]   [facility]  [address] [place], [place]  [address] [phone]    Fax [phone]

## 2014-11-23 NOTE — Patient Instructions (Signed)
Medication Instructions:  Continue current medications  Labwork: Fasting labs today AND in 6 months: Lipid, Liver, BMET  Testing/Procedures: Bilateral lower extremity duplex  Follow-Up: Your physician wants you to follow-up in: 6 months.  You will receive a reminder letter in the mail two months in advance.  If you don't receive a letter, please call our office to schedule the follow-up appointment.   Any Other Special Instructions Will Be Listed Below (If Applicable).  Vascular Ultrasound Vascular ultrasound is a test to see if you have blood flow problems or clots in your veins. Ultrasound uses harmless sound waves to take pictures of the inside of your body. A device (transducer) is held up against your body to capture these pictures. The continually changing images can be recorded on videotape or film. Diagnostic ultrasound imaging may also be called sonography or ultrasonography. There are different types of vascular ultrasound exams. An aortic ultrasound can show enlargement of the artery (aneurysm) or masses. A carotid ultrasound can show if there is blockage (atherosclerosis) of the large arteries in the neck that supply most of the blood to the brain. Vascular ultrasound may be used almost anywhere throughout the body. Some of the most common sites where it is used are in the neck, heart, abdomen, and legs. There are several types of ultrasound, including:  Continuous wave Doppler ultrasound. This type of ultrasound uses the change in pitch of the sound waves to provide information about blood flow through a blood vessel. Your health care provider listens to the sounds produced by the transducer.  Duplex ultrasound. This type of ultrasound uses standard ultrasound methods to produce a picture of a blood vessel and surrounding organs. In addition, a computer provides added information about the speed and direction of blood flow through the blood vessel. With this type of ultrasound it  is possible to see the structures inside the body and to evaluate blood flow within those structures at the same time. Blood flow in individual blood vessels is most commonly evaluated by duplex ultrasound.  RISKS AND COMPLICATIONS Ultrasound has been used for many years and has never shown any harmful effects. Studies in humans have shown no direct link between the use of ultrasound and future problems from the ultrasound. BEFORE THE PROCEDURE  For most Doppler ultrasound exams, no preparation is necessary.  Your health care provider may ask you not to eat, smoke, or chew gum the morning of your exam if the ultrasound scan involves your upper abdomen.  Ask your health care provider if you should take your regular medications the day of the exam. This is especially important if you are taking diabetes medicines. PROCEDURE There is no pain in an ultrasound exam. A gel is applied to your skin and the transducer is then placed on the area to be examined. The gel may feel cool. The gel wipes off easily, but it is a good idea to wear clothing that is easily washable. The images from inside your body are displayed on one or more monitors, which look like small television screens. The room is usually darkened during the exam. This makes it easier to see the images on the monitor. The length of the exam depends on many things including the portion of your body to be examined and the complexity of your body's anatomy. An ultrasound that looks at hardening of the arteries (arteriosclerosis) may take more scanning time. AFTER THE PROCEDURE You can safely drive home and return to regular activities immediately after your  exam. In many cases, follow-up exams are necessary to check on your condition or your response to therapy. Ask when your test results will be ready. Make sure you get your test results. Document Released: 07/04/2004 Document Revised: 06/28/2013 Document Reviewed: 09/15/2013 [facility] Patient  Information 2015 [employer] [place]. This information is not intended to replace advice given to you by your health care provider. Make sure you discuss any questions you have with your health care provider.

## 2014-12-01 ENCOUNTER — Other Ambulatory Visit: Payer: Medicare Other

## 2014-12-05 ENCOUNTER — Other Ambulatory Visit: Payer: Medicare Other

## 2015-01-02 ENCOUNTER — Other Ambulatory Visit: Payer: Self-pay | Admitting: Nurse Practitioner

## 2015-01-03 NOTE — Telephone Encounter (Signed)
Pt is a previous pt of Raquel's.  He has had several cancellations and no shows.  I did schedule him for 7.1.16 at 11am.  Please advise refills

## 2015-01-04 ENCOUNTER — Telehealth: Payer: Self-pay | Admitting: *Deleted

## 2015-01-04 NOTE — Telephone Encounter (Signed)
Pharmacy called requesting refills on Glipizide, Metformin, Atorvastatin, Lisinopril, and Carvedilol.  Pt has appoint on 7.1.16 with Doss. meds to be reviewed at that time

## 2015-01-05 ENCOUNTER — Ambulatory Visit (INDEPENDENT_AMBULATORY_CARE_PROVIDER_SITE_OTHER): Payer: Medicare Other | Admitting: Nurse Practitioner

## 2015-01-05 VITALS — BP 130/86 | HR 80 | Temp 97.4°F | Resp 18 | Ht 71.0 in | Wt 169.4 lb

## 2015-01-05 DIAGNOSIS — E785 Hyperlipidemia, unspecified: Secondary | ICD-10-CM

## 2015-01-05 DIAGNOSIS — I4891 Unspecified atrial fibrillation: Secondary | ICD-10-CM

## 2015-01-05 DIAGNOSIS — E119 Type 2 diabetes mellitus without complications: Secondary | ICD-10-CM

## 2015-01-05 MED ORDER — METFORMIN HCL 1000 MG PO TABS: 1000.0000 mg | ORAL_TABLET | Freq: Two times a day (BID) | ORAL | Status: AC

## 2015-01-05 MED ORDER — LISINOPRIL 20 MG PO TABS: 20.0000 mg | ORAL_TABLET | Freq: Every day | ORAL | Status: DC

## 2015-01-05 MED ORDER — GLIPIZIDE 5 MG PO TABS: 5.0000 mg | ORAL_TABLET | Freq: Two times a day (BID) | ORAL | Status: AC

## 2015-01-05 MED ORDER — THIAMINE HCL 100 MG PO TABS: 100.0000 mg | ORAL_TABLET | Freq: Every day | ORAL | Status: AC

## 2015-01-05 MED ORDER — BUDESONIDE-FORMOTEROL FUMARATE 160-4.5 MCG/ACT IN AERO: 2.0000 | INHALATION_SPRAY | Freq: Two times a day (BID) | RESPIRATORY_TRACT | Status: AC

## 2015-01-05 MED ORDER — CARVEDILOL 25 MG PO TABS: 25.0000 mg | ORAL_TABLET | Freq: Two times a day (BID) | ORAL | Status: AC

## 2015-01-05 MED ORDER — ATORVASTATIN CALCIUM 80 MG PO TABS: 80.0000 mg | ORAL_TABLET | Freq: Every day | ORAL | Status: DC

## 2015-01-05 NOTE — Patient Instructions (Signed)
Please visit the lab before leaving today.   Check your blood sugars- if you cannot find your device and supplies call me for refills.   Follow up in 3 months.

## 2015-01-05 NOTE — Telephone Encounter (Signed)
Spoke with pt, he is aware of his appoint and has confirmed that he will be here.

## 2015-01-05 NOTE — Progress Notes (Signed)
Pre visit review using our clinic review tool, if applicable. No additional management support is needed unless otherwise documented below in the visit note. 

## 2015-01-05 NOTE — Progress Notes (Signed)
   Subjective:    Patient ID: Jerry ArtistRobert Graham, male    DOB: [birth year], 64 y.o.   MRN: [id number]  HPI  [name] is a 64 yo male with a CC of medication refills.   1) No Cream or sugar in coffee this morning   2 days of no medication Not checking blood sugars- doesn't remember last check, thinks he has supplies.  Due for eye exam in Sept.   Review of Systems  Constitutional: Negative for fever, chills, diaphoresis and fatigue.  Eyes: Negative for visual disturbance.  Respiratory: Negative for chest tightness, shortness of breath and wheezing.   Cardiovascular: Negative for chest pain, palpitations and leg swelling.  Gastrointestinal: Negative for nausea, vomiting and diarrhea.  Endocrine: Negative for polydipsia, polyphagia and polyuria.  Skin: Negative for rash.  Neurological: Negative for dizziness, weakness and numbness.  Psychiatric/Behavioral: The patient is not nervous/anxious.       Objective:   Physical Exam  Constitutional: He is oriented to person, place, and time. He appears well-developed and well-nourished. No distress.  BP 130/86 mmHg  Pulse 80  Temp(Src) 97.4 F (36.3 C)  Resp 18  Ht 5\' 11"  (1.803 m)  Wt 169 lb 6.4 oz (76.839 kg)  BMI 23.64 kg/m2  SpO2 97%   HENT:  Head: Normocephalic and atraumatic.  Right Ear: External ear normal.  Left Ear: External ear normal.  Cardiovascular: Normal rate and regular rhythm.   Pulmonary/Chest: Effort normal and breath sounds normal. No respiratory distress. He has no wheezes. He has no rales. He exhibits no tenderness.  Neurological: He is alert and oriented to person, place, and time.  Skin: Skin is warm and dry. No rash noted. He is not diaphoretic.  Psychiatric: He has a normal mood and affect. His behavior is normal. Judgment and thought content normal.      Assessment & Plan:

## 2015-01-06 LAB — BASIC METABOLIC PANEL
BUN/Creatinine Ratio: 13 (ref 10–22)
BUN: 12 mg/dL (ref 8–27)
CALCIUM: 9.7 mg/dL (ref 8.6–10.2)
CO2: 25 mmol/L (ref 18–29)
Chloride: 96 mmol/L — ABNORMAL LOW (ref 97–108)
Creatinine, Ser: 0.95 mg/dL (ref 0.76–1.27)
GFR calc Af Amer: 97 mL/min/{1.73_m2} (ref >59)
GFR calc non Af Amer: 84 mL/min/{1.73_m2} (ref >59)
Glucose: 284 mg/dL — ABNORMAL HIGH (ref 65–99)
POTASSIUM: 4.9 mmol/L (ref 3.5–5.2)
SODIUM: 137 mmol/L (ref 134–144)

## 2015-01-06 LAB — HEPATIC FUNCTION PANEL
ALT: 26 IU/L (ref 0–44)
AST: 23 IU/L (ref 0–40)
Albumin: 4 g/dL (ref 3.6–4.8)
Alkaline Phosphatase: 117 IU/L (ref 39–117)
Bilirubin Total: 0.7 mg/dL (ref 0.0–1.2)
Bilirubin, Direct: 0.29 mg/dL (ref 0.00–0.40)
TOTAL PROTEIN: 7 g/dL (ref 6.0–8.5)

## 2015-01-06 LAB — LIPID PANEL
Chol/HDL Ratio: 4.2 ratio units (ref 0.0–5.0)
Cholesterol, Total: 216 mg/dL — ABNORMAL HIGH (ref 100–199)
HDL: 52 mg/dL (ref >39)
LDL CALC: 136 mg/dL — AB (ref 0–99)
Triglycerides: 138 mg/dL (ref 0–149)
VLDL CHOLESTEROL CAL: 28 mg/dL (ref 5–40)

## 2015-01-19 ENCOUNTER — Encounter: Payer: Self-pay | Admitting: Nurse Practitioner

## 2015-01-19 NOTE — Assessment & Plan Note (Signed)
Currently uncontrolled. Pt been out for 2 days of medications. Will refill and obtain BMET today.

## 2015-01-19 NOTE — Assessment & Plan Note (Signed)
Obtained Lipid panel today.

## 2015-04-09 ENCOUNTER — Ambulatory Visit: Payer: Medicare Other | Admitting: Nurse Practitioner

## 2015-04-09 DIAGNOSIS — Z0289 Encounter for other administrative examinations: Secondary | ICD-10-CM

## 2015-05-07 ENCOUNTER — Other Ambulatory Visit: Payer: Self-pay | Admitting: Nurse Practitioner

## 2015-05-23 ENCOUNTER — Telehealth: Payer: Self-pay | Admitting: Cardiovascular Disease

## 2015-05-23 NOTE — Telephone Encounter (Signed)
S/w pt regarding US LE appts he missed in May. Pt states he would still like this done as his legs are weak and he would like to know what is going on. Stressed importance of having tests done that MD ordered. Advised pt to keep lab and f/u appts next week. He assured me he would be here and would like to reschedule US LE when he is here on Monday. Pt verbalized understanding.

## 2015-05-23 NOTE — Telephone Encounter (Signed)
fyi patient has appt for fu with nahser next week but no showed for vascular u/s  Attempted to schedule something prior to appt on 05-30-15 but there is nothing open on schedule.

## 2015-05-28 ENCOUNTER — Ambulatory Visit (INDEPENDENT_AMBULATORY_CARE_PROVIDER_SITE_OTHER): Payer: Medicare Other

## 2015-05-28 DIAGNOSIS — I4891 Unspecified atrial fibrillation: Secondary | ICD-10-CM

## 2015-05-28 DIAGNOSIS — E785 Hyperlipidemia, unspecified: Secondary | ICD-10-CM

## 2015-05-28 DIAGNOSIS — I739 Peripheral vascular disease, unspecified: Secondary | ICD-10-CM

## 2015-05-29 LAB — BASIC METABOLIC PANEL
BUN / CREAT RATIO: 12 (ref 10–22)
BUN: 11 mg/dL (ref 8–27)
CALCIUM: 8.9 mg/dL (ref 8.6–10.2)
CO2: 23 mmol/L (ref 18–29)
Chloride: 103 mmol/L (ref 97–106)
Creatinine, Ser: 0.91 mg/dL (ref 0.76–1.27)
GFR, EST AFRICAN AMERICAN: 102 mL/min/{1.73_m2} (ref >59)
GFR, EST NON AFRICAN AMERICAN: 88 mL/min/{1.73_m2} (ref >59)
Glucose: 295 mg/dL — ABNORMAL HIGH (ref 65–99)
Potassium: 4.5 mmol/L (ref 3.5–5.2)
Sodium: 141 mmol/L (ref 136–144)

## 2015-05-29 LAB — HEPATIC FUNCTION PANEL
ALBUMIN: 4.1 g/dL (ref 3.6–4.8)
ALT: 33 IU/L (ref 0–44)
AST: 27 IU/L (ref 0–40)
Alkaline Phosphatase: 83 IU/L (ref 39–117)
BILIRUBIN TOTAL: 0.6 mg/dL (ref 0.0–1.2)
BILIRUBIN, DIRECT: 0.23 mg/dL (ref 0.00–0.40)
Total Protein: 6.7 g/dL (ref 6.0–8.5)

## 2015-05-29 LAB — LIPID PANEL
Chol/HDL Ratio: 3.5 ratio units (ref 0.0–5.0)
Cholesterol, Total: 147 mg/dL (ref 100–199)
HDL: 42 mg/dL (ref >39)
LDL CALC: 84 mg/dL (ref 0–99)
TRIGLYCERIDES: 107 mg/dL (ref 0–149)
VLDL Cholesterol Cal: 21 mg/dL (ref 5–40)

## 2015-05-30 ENCOUNTER — Ambulatory Visit (INDEPENDENT_AMBULATORY_CARE_PROVIDER_SITE_OTHER): Payer: Medicare Other | Admitting: Cardiovascular Disease

## 2015-05-30 ENCOUNTER — Encounter: Payer: Self-pay | Admitting: Cardiovascular Disease

## 2015-05-30 VITALS — BP 138/86 | HR 79 | Ht 70.0 in | Wt 170.2 lb

## 2015-05-30 DIAGNOSIS — I5022 Chronic systolic (congestive) heart failure: Secondary | ICD-10-CM | POA: Diagnosis not present

## 2015-05-30 DIAGNOSIS — I2510 Atherosclerotic heart disease of native coronary artery without angina pectoris: Secondary | ICD-10-CM

## 2015-05-30 DIAGNOSIS — I4891 Unspecified atrial fibrillation: Secondary | ICD-10-CM

## 2015-05-30 NOTE — Progress Notes (Signed)
Cardiology Office Note   Date:  05/30/2015   ID:  Jerry Graham, DOB [birth year], MRN [id number]  PCP:  Carollee Leitzoss, Carrie M, NP  Cardiologist:   Vesta MixerNahser, Philip J, MD   Chief Complaint  Patient presents with  . Coronary Artery Disease    6 month follow up. Meds reviewed by the patient verbally. "doing well."    1. Chronic systolic CHF 2. CVA 3. Diabetes mellitus 4. Hyperlipidemia 5. Cigarette smoker 6. CAD- s/p Coronary artery bypass grafting x3 FEb. 24 ,2015. (left internal mammary artery to LAD, saphenous vein graft to OM1, saphenous vein graft to OM2) . 7. Post op Atrial fib    History of Present Illness:  Jerry Graham is seen for followup today. He recently had a stroke about one month ago. Has formed a transesophageal echo which revealed moderate to severe left ventricular dysfunction with an ejection fraction of around 30-35%. He was found to have some spontaneous contrast but no evidence of thrombus. He is atrial septum was intact it was no evidence of PFO or ASD.  He presents today for followup. He's been on lisinopril for quite some time. Carvedilol 6.25 mg twice a day was added after the diagnosis of congestive heart failure.  He has improved his diet. He was previously eating a lot of fast foods but now is consistently staying away from fast foods.   Dec. 8, 2014:  Jerry Graham is doing well. He did have some CP with sexual intercourse. He continues to have some shortness of breath.  September 26, 2013:  Jerry Graham has had CABG ( Feb. 24, 2015) since I last saw him. Doing well. Minimal CP. Having some pain from the surgery. Breathing OK No angina. Still at the nursing facility.   December 16, 2013:  Jerry MaduroRobert is doing well. He has some musculoskelatal pain. Is lifting weights.   Dec. 14 , 2015  Jerry MaduroRobert is a 64 yo who we follow for CAD ( s/p CABG) , chronic systolic CHF, CVA, hyperlipidemia. Still smoking 7-8 cigarettes a day.    Nov 23, 2014:  Jerry ArtistRobert  Graham is a 64 y.o. male who presents for  Follow up of his CAD  Feeling well,  No co.  Has some claudication in left leg with walking .  Still smoking   Nov. 23, 2016: Jerry Graham is seen today for follow up .  He has been seen typically in our [place] office.  Complains of some weakness on the left side Also complains of some memory loss  Advised him to see his medical doctor     Past Medical History  Diagnosis Date  . Hypertension   . Diabetes mellitus without complication (HCC)   . Stroke [facility](HCC) 2013, 2014  . Hyperlipidemia   . Tobacco abuse   . CHF (congestive heart failure) (HCC)   . Short-term memory loss     from stroke  . Coronary artery disease   . A-fib [facility](HCC)     Past Surgical History  Procedure Laterality Date  . Lung biopsy      years ago  . Tee without cardioversion N/A 04/27/2013    Procedure: TRANSESOPHAGEAL ECHOCARDIOGRAM (TEE);  Surgeon: Vesta MixerPhilip J Nahser, MD;  [facility];  Service: Cardiovascular;  Laterality: N/A;  . Intraoperative transesophageal echocardiogram N/A 08/30/2013    Procedure: INTRAOPERATIVE TRANSESOPHAGEAL ECHOCARDIOGRAM;  Surgeon: Kerin PernaPeter Van Trigt, MD;  [facility];  Service: Open Heart Surgery;  Laterality: N/A;  . Cardiac catheterization    . Coronary  artery bypass graft N/A 08/30/2013    Procedure: CORONARY ARTERY BYPASS GRAFTING (CABG);  Surgeon: Kerin Perna, MD;  Location: [facility] OR;  Service: Open Heart Surgery;  Laterality: N/A;  CABG x  3 , using left internal mammary artery and right leg greater saphenous vein harvested endoscopically  . Left heart catheterization with coronary angiogram N/A 06/16/2013    Procedure: LEFT HEART CATHETERIZATION WITH CORONARY ANGIOGRAM;  Surgeon: Alvia Grove, MD;  Location: [facility] CATH LAB;  Service: Cardiovascular;  Laterality: N/A;     Current Outpatient Prescriptions  Medication Sig Dispense Refill  . aspirin 81 MG tablet Take 81 mg by mouth daily.    Marland Kitchen atorvastatin (LIPITOR)  80 MG tablet Take 1 tablet (80 mg total) by mouth daily at 6 PM. 30 tablet 5  . budesonide-formoterol (SYMBICORT) 160-4.5 MCG/ACT inhaler Inhale 2 puffs into the lungs 2 (two) times daily. 1 Inhaler 12  . carvedilol (COREG) 25 MG tablet Take 1 tablet (25 mg total) by mouth 2 (two) times daily with a meal. 60 tablet 5  . glipiZIDE (GLUCOTROL) 5 MG tablet Take 1 tablet (5 mg total) by mouth 2 (two) times daily before a meal. 60 tablet 5  . glucose blood test strip Use as instructed to check blood sugar twice daily 100 each 5  . lisinopril (PRINIVIL,ZESTRIL) 20 MG tablet Take 1 tablet (20 mg total) by mouth daily. 30 tablet 5  . metFORMIN (GLUCOPHAGE) 1000 MG tablet Take 1 tablet (1,000 mg total) by mouth 2 (two) times daily with a meal. 60 tablet 5  . thiamine 100 MG tablet Take 1 tablet (100 mg total) by mouth daily. 30 tablet 5   No current facility-administered medications for this visit.    Allergies:   Review of patient's allergies indicates no known allergies.    Social History:  The patient  reports that he has been smoking Cigarettes.  He has a 12.5 pack-year smoking history. He does not have any smokeless tobacco history on file. He reports that he does not drink alcohol or use illicit drugs.   Family History:  The patient's family history includes Diabetes in his father; Heart disease in his mother; Hyperlipidemia in his sister; Hypertension in his father and sister; Scleroderma in his mother; Transient ischemic attack in his father.    ROS:  Please see the history of present illness.    Review of Systems: Constitutional:  denies fever, chills, diaphoresis, appetite change and fatigue.  HEENT: denies photophobia, eye pain, redness, hearing loss, ear pain, congestion, sore throat, rhinorrhea, sneezing, neck pain, neck stiffness and tinnitus.  Respiratory: denies SOB, DOE, cough, chest tightness, and wheezing.  Cardiovascular: denies chest pain, palpitations and leg swelling.    Gastrointestinal: denies nausea, vomiting, abdominal pain, diarrhea, constipation, blood in stool.  Genitourinary: denies dysuria, urgency, frequency, hematuria, flank pain and difficulty urinating.  Musculoskeletal: denies  myalgias, back pain, joint swelling, arthralgias and gait problem.   Skin: denies pallor, rash and wound.  Neurological: denies dizziness, seizures, syncope, weakness, light-headedness, numbness and headaches.   Hematological: denies adenopathy, easy bruising, personal or family bleeding history.  Psychiatric/ Behavioral: denies suicidal ideation, mood changes, confusion, nervousness, sleep disturbance and agitation.       All other systems are reviewed and negative.    PHYSICAL EXAM: VS:  BP 138/86 mmHg  Pulse 79  Ht  (1.778 m)  Wt 170 lb 4 oz (77.225 kg)  BMI 24.43 kg/m2 , BMI Body mass index is 24.43  kg/(m^2). GEN: Well nourished, well developed, in no acute distress HEENT: normal Neck: no JVD, carotid bruits, or masses Cardiac: RRR; no murmurs, rubs, or gallops,no edema  Respiratory:  clear to auscultation bilaterally, normal work of breathing GI: soft, nontender, nondistended, + BS MS: no deformity or atrophy, reduced pulses in feet.  Skin: warm and dry, no rash Neuro:  Strength and sensation are intact Psych: normal   EKG:  EKG is ordered today. The ekg ordered today demonstrates NSR at 84.  LVH with repol abl.    Recent Labs: 05/28/2015: ALT 33; BUN 11; Creatinine, Ser 0.91; Potassium 4.5; Sodium 141    Lipid Panel    Component Value Date/Time   CHOL 147 05/28/2015 0735   CHOL 275* 03/31/2013 0620   TRIG 107 05/28/2015 0735   HDL 42 05/28/2015 0735   HDL 38* 03/31/2013 0620   CHOLHDL 3.5 05/28/2015 0735   CHOLHDL 7.2 03/31/2013 0620   VLDL 40 03/31/2013 0620   LDLCALC 84 05/28/2015 0735   LDLCALC 197* 03/31/2013 0620      Wt Readings from Last 3 Encounters:  05/30/15 170 lb 4 oz (77.225 kg)  01/05/15 169 lb 6.4 oz (76.839  kg)  11/23/14 169 lb 8 oz (76.885 kg)      Other studies Reviewed: Additional studies/ records that were reviewed today include: . Review of the above records demonstrates:    ASSESSMENT AND PLAN:  1. Chronic systolic CHF - he's doing well. His breathing is okay. 2. CVA - overall seems to be doing very well. 3. Diabetes mellitus - glucose was 295 2 days ago .  he has not seen his general medical doctor in quite some time. I've encouraged him to make another appointment. 4. Hyperlipidemia - we'll check fasting lipids, liver enzymes and basic medical profile today. I'll see him again in 6 months will check fasting lipids, liver enzymes, and basic medical profile at that time. 5. Cigarette smoker 6. CAD- s/p Coronary artery bypass grafting x3 FEb. 24 ,2015. (left internal mammary artery to LAD, saphenous vein graft to OM1, saphenous vein graft to OM2) . 7. Post op Atrial fib - he's not had any further episodes of atrial fibrillation. 8. Possible peripheral vascular disease. He has a long history of cigarette smoking. His pulses are reduced.   Advised him to stop smoking - again  9. Leg weakness - will need to see his medical doctor 10. Memory problems - advised him to see his medical doctor   Current medicines are reviewed at length with the patient today.  The patient does not have concerns regarding medicines.  The following changes have been made:  no change  Labs/ tests ordered today include:   Orders Placed This Encounter  Procedures  . EKG 12-Lead     Disposition:   FU with the  office  in 6 months  With fasting lipids, liver enz, BMP      Nahser, Deloris Ping, MD  05/30/2015 2:26 PM    [facility] [address] [place], [place], [place]  [address] Phone: [phone]; Fax: [phone]   [facility]  [address] [place], [place]  [address] [phone]    Fax [phone]

## 2015-05-30 NOTE — Patient Instructions (Addendum)
Call Carollee Leitzoss, Carrie M, NP for evaluation of your   1. Elevated glucose - was 295  2. Memory issues  3. Leg weakness   Medication Instructions:  Your physician recommends that you continue on your current medications as directed. Please refer to the Current Medication list given to you today.   Labwork: Lipid, liver, BMP in 6 months  Testing/Procedures: none  Follow-Up: Your physician wants you to follow-up in: six months. You will receive a reminder letter in the mail two months in advance. If you don't receive a letter, please call our office to schedule the follow-up appointment.   Any Other Special Instructions Will Be Listed Below (If Applicable).     If you need a refill on your cardiac medications before your next appointment, please call your pharmacy.

## 2015-11-14 ENCOUNTER — Other Ambulatory Visit: Payer: Self-pay | Admitting: Nurse Practitioner

## 2015-11-14 NOTE — Telephone Encounter (Signed)
Last Appt was 01/05/15, please advise refill. thanks

## 2015-11-23 ENCOUNTER — Other Ambulatory Visit: Payer: Self-pay

## 2015-11-23 MED ORDER — LISINOPRIL 20 MG PO TABS: 20.0000 mg | ORAL_TABLET | Freq: Every day | ORAL | Status: AC

## 2015-11-23 MED ORDER — ATORVASTATIN CALCIUM 80 MG PO TABS: 80.0000 mg | ORAL_TABLET | Freq: Every day | ORAL | Status: AC

## 2015-11-26 ENCOUNTER — Ambulatory Visit: Payer: Medicare Other | Admitting: Cardiovascular Disease

## 2015-12-10 ENCOUNTER — Ambulatory Visit: Payer: Medicare Other | Admitting: Cardiovascular Disease

## 2015-12-27 ENCOUNTER — Ambulatory Visit (INDEPENDENT_AMBULATORY_CARE_PROVIDER_SITE_OTHER): Payer: Medicare HMO | Admitting: Podiatry

## 2015-12-27 ENCOUNTER — Encounter: Payer: Self-pay | Admitting: Podiatry

## 2015-12-27 DIAGNOSIS — M79675 Pain in left toe(s): Secondary | ICD-10-CM | POA: Diagnosis not present

## 2015-12-27 DIAGNOSIS — B351 Tinea unguium: Secondary | ICD-10-CM

## 2015-12-27 DIAGNOSIS — M79674 Pain in right toe(s): Secondary | ICD-10-CM | POA: Diagnosis not present

## 2015-12-27 NOTE — Progress Notes (Signed)
   Subjective:    Patient ID: Jerry Graham, male    DOB: 1950/10/24, 65 y.o.   MRN: [id number]  HPI  65 year old male presents the office today for concerns of thick, painful, elongated toenails that he cannot trim himself. He is diabetic and his blood sugars typically runs between 200-300. He is unsure of his last A1c. Denies any numbness or tingling. No claudication symptoms.   Review of Systems  All other systems reviewed and are negative.      Objective:   Physical Exam General: AAO x3, NAD  Dermatological: Nails hypertrophic, dystrophic, brittle, discolored, elongated 10. No surrounding redness or drainage. Tenderness to nails 1-5 bilaterally. No open lesions or pre-ulcerative lesions.  Vascular: Dorsalis Pedis artery and Posterior Tibial artery pedal pulses are 2/4 bilateral with immedate capillary fill time. Pedal hair growth present. There is no pain with calf compression, swelling, warmth, erythema.   Neruologic: Grossly intact via light touch bilateral. Vibratory intact via tuning fork bilateral. Protective threshold with Semmes Wienstein monofilament intact to all pedal sites bilateral.   Musculoskeletal: No gross boney pedal deformities bilateral. No pain, crepitus, or limitation noted with foot and ankle range of motion bilateral. Muscular strength 5/5 in all groups tested bilateral.  Gait: Unassisted, Nonantalgic.      Assessment & Plan:  65 year old male presents for symptomatic onychomycosis -Treatment options discussed including all alternatives, risks, and complications -Etiology of symptoms were discussed -Nails debrided 10 without complications or bleeding. -Daily foot inspection -Follow-up in 3 months or sooner if any problems arise. In the meantime, encouraged to call the office with any questions, concerns, change in symptoms.   Jerry Graham, DPM

## 2015-12-31 ENCOUNTER — Encounter: Payer: Self-pay | Admitting: Cardiovascular Disease

## 2016-02-05 ENCOUNTER — Encounter: Payer: Self-pay | Admitting: Medical Oncology

## 2016-02-05 ENCOUNTER — Emergency Department
Admission: EM | Admit: 2016-02-05 | Discharge: 2016-02-05 | Disposition: A | Discharge status: Home / Self Care | Payer: Medicare Other | Attending: Emergency Medicine | Admitting: Emergency Medicine

## 2016-02-05 ENCOUNTER — Emergency Department: Payer: Medicare Other

## 2016-02-05 DIAGNOSIS — S0990XA Unspecified injury of head, initial encounter: Secondary | ICD-10-CM | POA: Diagnosis not present

## 2016-02-05 DIAGNOSIS — Y9301 Activity, walking, marching and hiking: Secondary | ICD-10-CM | POA: Diagnosis not present

## 2016-02-05 DIAGNOSIS — W1839XA Other fall on same level, initial encounter: Secondary | ICD-10-CM | POA: Diagnosis not present

## 2016-02-05 DIAGNOSIS — I2510 Atherosclerotic heart disease of native coronary artery without angina pectoris: Secondary | ICD-10-CM | POA: Insufficient documentation

## 2016-02-05 DIAGNOSIS — Z951 Presence of aortocoronary bypass graft: Secondary | ICD-10-CM | POA: Insufficient documentation

## 2016-02-05 DIAGNOSIS — I110 Hypertensive heart disease with heart failure: Secondary | ICD-10-CM | POA: Insufficient documentation

## 2016-02-05 DIAGNOSIS — R278 Other lack of coordination: Secondary | ICD-10-CM | POA: Insufficient documentation

## 2016-02-05 DIAGNOSIS — W19XXXA Unspecified fall, initial encounter: Secondary | ICD-10-CM

## 2016-02-05 DIAGNOSIS — Y929 Unspecified place or not applicable: Secondary | ICD-10-CM | POA: Diagnosis not present

## 2016-02-05 DIAGNOSIS — F17210 Nicotine dependence, cigarettes, uncomplicated: Secondary | ICD-10-CM | POA: Diagnosis not present

## 2016-02-05 DIAGNOSIS — Y999 Unspecified external cause status: Secondary | ICD-10-CM | POA: Insufficient documentation

## 2016-02-05 DIAGNOSIS — Z8673 Personal history of transient ischemic attack (TIA), and cerebral infarction without residual deficits: Secondary | ICD-10-CM | POA: Diagnosis not present

## 2016-02-05 DIAGNOSIS — E119 Type 2 diabetes mellitus without complications: Secondary | ICD-10-CM | POA: Insufficient documentation

## 2016-02-05 DIAGNOSIS — Z7984 Long term (current) use of oral hypoglycemic drugs: Secondary | ICD-10-CM | POA: Insufficient documentation

## 2016-02-05 DIAGNOSIS — Z7982 Long term (current) use of aspirin: Secondary | ICD-10-CM | POA: Insufficient documentation

## 2016-02-05 DIAGNOSIS — I5022 Chronic systolic (congestive) heart failure: Secondary | ICD-10-CM | POA: Insufficient documentation

## 2016-02-05 DIAGNOSIS — R42 Dizziness and giddiness: Secondary | ICD-10-CM | POA: Diagnosis not present

## 2016-02-05 LAB — BASIC METABOLIC PANEL
Anion gap: 9 (ref 5–15)
BUN: 21 mg/dL — AB (ref 6–20)
CHLORIDE: 103 mmol/L (ref 101–111)
CO2: 27 mmol/L (ref 22–32)
CREATININE: 0.96 mg/dL (ref 0.61–1.24)
Calcium: 9.5 mg/dL (ref 8.9–10.3)
GFR calc Af Amer: >60 mL/min (ref >60)
GFR calc non Af Amer: >60 mL/min (ref >60)
GLUCOSE: 352 mg/dL — AB (ref 65–99)
Potassium: 3.9 mmol/L (ref 3.5–5.1)
SODIUM: 139 mmol/L (ref 135–145)

## 2016-02-05 LAB — CBC
HCT: 48 % (ref 40.0–52.0)
Hemoglobin: 16.6 g/dL (ref 13.0–18.0)
MCH: 28.6 pg (ref 26.0–34.0)
MCHC: 34.5 g/dL (ref 32.0–36.0)
MCV: 82.7 fL (ref 80.0–100.0)
PLATELETS: 208 10*3/uL (ref 150–440)
RBC: 5.8 MIL/uL (ref 4.40–5.90)
RDW: 14.1 % (ref 11.5–14.5)
WBC: 5.5 10*3/uL (ref 3.8–10.6)

## 2016-02-05 LAB — URINALYSIS COMPLETE WITH MICROSCOPIC (ARMC ONLY)
BILIRUBIN URINE: NEGATIVE
Bacteria, UA: NONE SEEN
Hgb urine dipstick: NEGATIVE
Ketones, ur: NEGATIVE mg/dL
Leukocytes, UA: NEGATIVE
Nitrite: NEGATIVE
Protein, ur: 30 mg/dL — AB
SQUAMOUS EPITHELIAL / LPF: NONE SEEN
Specific Gravity, Urine: 1.033 — ABNORMAL HIGH (ref 1.005–1.030)
pH: 5 (ref 5.0–8.0)

## 2016-02-05 LAB — GLUCOSE, CAPILLARY: GLUCOSE-CAPILLARY: 254 mg/dL — AB (ref 65–99)

## 2016-02-05 NOTE — ED Provider Notes (Signed)
[facility] Emergency Department Provider Note   ____________________________________________   First MD Initiated Contact with Patient 02/05/16 1758     (approximate)  I have reviewed the triage vital signs and the nursing notes.   HISTORY  Chief Complaint Fall and Dizziness    HPI Jerry Graham is a 65 y.o. male presents for evaluation due to a fall today. The patient reports he's had 2 previous strokes and takes an aspirin daily, that since having his strokes he's had trouble with some coordination in the left leg and he occasionally stumbles and falls. He was walking to the mailbox when he reports he stumbled because of difficulty with his left leg that has been an ongoing issue for months, though over the last 2-3 weeks it is seem just slightly worse but he also reports that he's had trouble at its not even sure that that much different.  He did not fall or strike his head. He did fall onto his left side and landed and caught himself with his left shoulder. He is not having any pain or discomfort in the left arm chest neck back or other where else. He was able to get up and was brought here for further evaluation.  Denies any injury. No recent infections or fevers. No abdominal pain, nausea or vomiting.   Past Medical History:  Diagnosis Date  . A-fib (HCC)   . CHF (congestive heart failure) (HCC)   . Coronary artery disease   . Diabetes mellitus without complication (HCC)   . Hyperlipidemia   . Hypertension   . Short-term memory loss    from stroke  . Stroke [facility]) 2013, 2014  . Tobacco abuse     Patient Active Problem List   Diagnosis Date Noted  . Diabetes type 2, controlled (HCC) 02/28/2014  . Screen for colon cancer 02/28/2014  . Diabetic eye exam (HCC) 02/28/2014  . Wide-complex tachycardia (HCC) 09/06/2013  . Atrial fibrillation with RVR (HCC) 09/06/2013  . CAD (coronary artery disease) 08/30/2013  . Tobacco abuse counseling  07/28/2013  . Colon cancer screening 05/27/2013  . HLD (hyperlipidemia) 05/27/2013  . Chronic systolic CHF (congestive heart failure) (HCC) 04/04/2013  . Type II diabetes mellitus (HCC) 03/30/2013  . Acute ischemic stroke (HCC) 03/30/2013  . Hypertension 03/30/2013    Past Surgical History:  Procedure Laterality Date  . CARDIAC CATHETERIZATION    . CORONARY ARTERY BYPASS GRAFT N/A 08/30/2013   Procedure: CORONARY ARTERY BYPASS GRAFTING (CABG);  Surgeon: Kerin Perna, MD;  Location: [facility] OR;  Service: Open Heart Surgery;  Laterality: N/A;  CABG x  3 , using left internal mammary artery and right leg greater saphenous vein harvested endoscopically  . INTRAOPERATIVE TRANSESOPHAGEAL ECHOCARDIOGRAM N/A 08/30/2013   Procedure: INTRAOPERATIVE TRANSESOPHAGEAL ECHOCARDIOGRAM;  Surgeon: Kerin Perna, MD;  Location: [facility] OR;  Service: Open Heart Surgery;  Laterality: N/A;  . LEFT HEART CATHETERIZATION WITH CORONARY ANGIOGRAM N/A 06/16/2013   Procedure: LEFT HEART CATHETERIZATION WITH CORONARY ANGIOGRAM;  Surgeon: Alvia Grove, MD;  Location: [facility] CATH LAB;  Service: Cardiovascular;  Laterality: N/A;  . LUNG BIOPSY     years ago  . TEE WITHOUT CARDIOVERSION N/A 04/27/2013   Procedure: TRANSESOPHAGEAL ECHOCARDIOGRAM (TEE);  Surgeon: Vesta Mixer, MD;  Location: [facility] ENDOSCOPY;  Service: Cardiovascular;  Laterality: N/A;    Prior to Admission medications   Medication Sig Start Date End Date Taking? Authorizing Provider  aspirin 81 MG tablet Take 81 mg by mouth daily.  Historical Provider, MD  atorvastatin (LIPITOR) 80 MG tablet Take 1 tablet (80 mg total) by mouth daily at 6 PM. 11/23/15   Tommie Sams, DO  budesonide-formoterol (SYMBICORT) 160-4.5 MCG/ACT inhaler Inhale 2 puffs into the lungs 2 (two) times daily. 01/05/15   Carollee Leitz, NP  carvedilol (COREG) 25 MG tablet Take 1 tablet (25 mg total) by mouth 2 (two) times daily with a meal. 01/05/15   Carollee Leitz, NP  glipiZIDE (GLUCOTROL) 5  MG tablet Take 1 tablet (5 mg total) by mouth 2 (two) times daily before a meal. 01/05/15   Carollee Leitz, NP  glucose blood test strip Use as instructed to check blood sugar twice daily 10/05/13   Raquel M Rey, NP  lisinopril (PRINIVIL,ZESTRIL) 20 MG tablet Take 1 tablet (20 mg total) by mouth daily. 11/23/15   Tommie Sams, DO  metFORMIN (GLUCOPHAGE) 1000 MG tablet Take 1 tablet (1,000 mg total) by mouth 2 (two) times daily with a meal. 01/05/15   Carollee Leitz, NP  thiamine 100 MG tablet Take 1 tablet (100 mg total) by mouth daily. 01/05/15   Carollee Leitz, NP    Allergies Review of patient's allergies indicates no known allergies.  Family History  Problem Relation Age of Onset  . Scleroderma Mother     Living, 91  . Heart disease Mother   . Diabetes Father     Living, 94  . Hypertension Father   . Transient ischemic attack Father   . Hypertension Sister   . Hyperlipidemia Sister     Social History Social History  Substance Use Topics  . Smoking status: Current Some Day Smoker    Packs/day: 0.25    Years: 50.00    Types: Cigarettes    Last attempt to quit: 08/29/2013  . Smokeless tobacco: Not on file     Comment: currently smokes 1/5 ppd  . Alcohol use No    Review of Systems Constitutional: No fever/chills Eyes: No visual changes. ENT: No sore throat. Cardiovascular: Denies chest pain. Respiratory: Denies shortness of breath. Gastrointestinal: No abdominal pain.  No nausea, no vomiting.   Genitourinary: Negative for dysuria.Sometimes has trouble emptying his bladder due to a large prostate. No changes. Musculoskeletal: Negative for back pain. Skin: Negative for rash. Neurological: Negative for headaches, focal weakness or numbness.  10-point ROS otherwise negative.  ____________________________________________   PHYSICAL EXAM:  VITAL SIGNS: ED Triage Vitals  Enc Vitals Group     BP 02/05/16 1617 (!) 154/88     Pulse Rate 02/05/16 1617 75     Resp 02/05/16 1617 20      Temp 02/05/16 1617 98.5 F (36.9 C)     Temp Source 02/05/16 1617 Oral     SpO2 02/05/16 1617 96 %     Weight 02/05/16 1617 178 lb (80.7 kg)     Height 02/05/16 1617  (1.778 m)     Head Circumference --      Peak Flow --      Pain Score 02/05/16 1756 0     Pain Loc --      Pain Edu? --      Excl. in GC? --     Constitutional: Alert and oriented. Well appearing and in no acute distress. Eyes: Conjunctivae are normal. PERRL. EOMI. Head: Atraumatic. Nose: No congestion/rhinnorhea. Mouth/Throat: Mucous membranes are moist.  Oropharynx non-erythematous. Neck: No stridor.  No cervical spine tenderness. Cardiovascular: Normal rate, regular rhythm. Grossly normal heart  sounds.  Good peripheral circulation. Respiratory: Normal respiratory effort.  No retractions. Lungs CTAB. Gastrointestinal: Soft and nontender. No distention. Musculoskeletal: No lower extremity tenderness nor edema.  No joint effusions. Neurologic:  Normal speech and language. No gross focal neurologic deficits are appreciated.   His examination is very reassuring, and I find no deficit. The patient has no pronator drift. The patient has normal cranial nerve exam. Extraocular movements are normal. Visual fields are normal. Patient has 5 out of 5 strength in all extremities. There is no numbness or gross, acute sensory abnormality in the extremities bilaterally. No speech disturbance. No dysarthria. No aphasia. No ataxia. Normal finger nose finger bilat. Patient speaking in full and clear sentences. Able to sit up on his own without any assistance.  Skin:  Skin is warm, dry and intact. No rash noted. Psychiatric: Mood and affect are normal. Speech and behavior are normal.  ____________________________________________   LABS (all labs ordered are listed, but only abnormal results are displayed)  Labs Reviewed  BASIC METABOLIC PANEL - Abnormal; Notable for the following:       Result Value    Glucose, Bld 352 (*)    BUN 21 (*)    All other components within normal limits  URINALYSIS COMPLETEWITH MICROSCOPIC ([facility] ONLY) - Abnormal; Notable for the following:    Color, Urine YELLOW (*)    APPearance CLEAR (*)    Glucose, UA >500 (*)    Specific Gravity, Urine 1.033 (*)    Protein, ur 30 (*)    All other components within normal limits  GLUCOSE, CAPILLARY - Abnormal; Notable for the following:    Glucose-Capillary 254 (*)    All other components within normal limits  CBC  CBG MONITORING, ED   ____________________________________________  EKG  Reviewed and to revive me at 1620 Ventricular rate 80 PR 180 QRS 110 QTc 440 Normal sinus, evidence of left ventricular hypertrophy with repolarization abnormality as well as T-wave inversions noted in the lateral distribution. Compared with the patient's previous EKG from 06/08/2015 at find no change. The patient reports no cardiac or chest symptoms. ____________________________________________  RADIOLOGY  Ct Head Wo Contrast  Result Date: 02/05/2016 CLINICAL DATA:  Two falls in the past week. Head head. Weakness and dizziness for the past week. Scratched EXAM: CT HEAD WITHOUT CONTRAST TECHNIQUE: Contiguous axial images were obtained from the base of the skull through the vertex without intravenous contrast. COMPARISON:  Head CT and brain MRI dated 03/30/2013. FINDINGS: Brain: Again noted is generalized parenchymal atrophy with commensurate dilatation of the ventricles and sulci. Chronic small vessel ischemic changes again noted within the bilateral periventricular and subcortical white matter regions. Old infarcts again noted within the right basal ganglia, with associated encephalomalacia. No new infarcts. There is no mass, hemorrhage, edema or other evidence of acute parenchymal abnormality. No extra-axial hemorrhage. Vascular: No hyperdense vessel or unexpected calcification. There are chronic calcified atherosclerotic changes of  the large vessels at the skull base. Skull: Negative for fracture or focal lesion. Sinuses/Orbits: No acute findings. Other: None. IMPRESSION: 1. No acute findings. No intracranial mass, hemorrhage or edema. No skull fracture. 2. Atrophy and chronic ischemic changes, as detailed above. Electronically Signed   By: Bary Richard M.D.   On: 02/05/2016 19:23    ____________________________________________   PROCEDURES  Procedure(s) performed: None  Procedures  Critical Care performed: No  ____________________________________________   INITIAL IMPRESSION / ASSESSMENT AND PLAN / ED COURSE  Pertinent labs & imaging results that were available during  my care of the patient were reviewed by me and considered in my medical decision making (see chart for details).  Patient presents for evaluation after tripping and falling at the mailbox. No evidence of injury. Full range of motion of all extremities without pain or discomfort. No bruising or injury noted over the left shoulder where he reports landing. He did not strike his head or neck. He does report that he has had lots of trouble with some coordination involving the left lower leg with walking since previous stroke for which she is currently on aspirin. He does not demonstrate any evidence of an obvious new deficits today, but we will obtain CT of the head to evaluate for any evolution of his previous infarction or evidence of a new infarct though I find it highly unlikely. Based on the patient's description it sounds as though he probably likely just tripped due to his poor balance and residual symptoms from prior stroke.  ----------------------------------------- 7:53 PM on 02/05/2016 -----------------------------------------  Blood glucose improved to 250. Patient reports compliance, and that he is currently in the process of arranging primary care follow-up with Dr. Park Breed as he is switched doctors. Encourage close follow-up with patient who  is in agreement. Return precautions and treatment recommendations and follow-up discussed with the patient who is agreeable with the plan.   Clinical Course     ____________________________________________   FINAL CLINICAL IMPRESSION(S) / ED DIAGNOSES  Final diagnoses:  Fall, initial encounter  Coordination impairment      NEW MEDICATIONS STARTED DURING THIS VISIT:  New Prescriptions   No medications on file     Note:  This document was prepared using Dragon voice recognition software and may include unintentional dictation errors.     Sharyn Creamer, MD 02/05/16 [phone]

## 2016-02-05 NOTE — ED Triage Notes (Signed)
Pt here with reports that pt has had 2 falls in past week. Today about 1 hr pta pt reports he fell but unsure why, pt states that he has been feeling weak and dizzy for the past week. Pt reports he hit his head but denies LOC. Takes ASA at home.

## 2016-02-05 NOTE — ED Notes (Signed)
MD at bedside. 

## 2016-02-05 NOTE — ED Notes (Addendum)
Pt to ED for fall after walking to mail box.  Pt states he fell on his left arm, but still has full ROM. Pt denies hitting head. Pt A&Ox4. Pt states he feels weak on left side.

## 2016-02-05 NOTE — ED Notes (Signed)
Pt transported to CT via stretcher.  

## 2016-02-05 NOTE — ED Notes (Signed)
Pt returned from CT via stretcher. Pt requested warm blanket. Family given warm blankets as well.

## 2016-02-05 NOTE — Discharge Instructions (Signed)
You have been seen in the Emergency Department (ED) today for a fall.  Your work up does not show any concerning injuries.   ? ?Please follow up with your doctor regarding today's Emergency Department (ED) visit and your recent fall.   ? ?Return to the ED if you have any headache, confusion, slurred speech, weakness/numbness of any arm or leg, or any increased pain. ? ?

## 2016-02-22 DIAGNOSIS — I10 Essential (primary) hypertension: Secondary | ICD-10-CM | POA: Diagnosis not present

## 2016-02-22 DIAGNOSIS — E1165 Type 2 diabetes mellitus with hyperglycemia: Secondary | ICD-10-CM | POA: Diagnosis not present

## 2016-02-22 DIAGNOSIS — I639 Cerebral infarction, unspecified: Secondary | ICD-10-CM | POA: Diagnosis not present

## 2016-03-26 ENCOUNTER — Other Ambulatory Visit
Admission: RE | Admit: 2016-03-26 | Discharge: 2016-03-26 | Disposition: A | Discharge status: Home / Self Care | Payer: Medicare Other | Source: Ambulatory Visit | Attending: Physician Assistant | Admitting: Physician Assistant

## 2016-03-26 DIAGNOSIS — Z125 Encounter for screening for malignant neoplasm of prostate: Secondary | ICD-10-CM | POA: Insufficient documentation

## 2016-03-26 DIAGNOSIS — I639 Cerebral infarction, unspecified: Secondary | ICD-10-CM | POA: Diagnosis not present

## 2016-03-26 DIAGNOSIS — E1165 Type 2 diabetes mellitus with hyperglycemia: Secondary | ICD-10-CM | POA: Diagnosis not present

## 2016-03-26 DIAGNOSIS — Z0001 Encounter for general adult medical examination with abnormal findings: Secondary | ICD-10-CM | POA: Diagnosis not present

## 2016-03-26 DIAGNOSIS — I10 Essential (primary) hypertension: Secondary | ICD-10-CM | POA: Diagnosis not present

## 2016-03-26 DIAGNOSIS — E782 Mixed hyperlipidemia: Secondary | ICD-10-CM | POA: Insufficient documentation

## 2016-03-26 LAB — CBC WITH DIFFERENTIAL/PLATELET
Basophils Absolute: 0.2 10*3/uL — ABNORMAL HIGH (ref 0–0.1)
Basophils Relative: 2 %
EOS ABS: 0.5 10*3/uL (ref 0–0.7)
EOS PCT: 6 %
HCT: 44.7 % (ref 40.0–52.0)
Hemoglobin: 15.7 g/dL (ref 13.0–18.0)
LYMPHS ABS: 2.9 10*3/uL (ref 1.0–3.6)
LYMPHS PCT: 39 %
MCH: 29.1 pg (ref 26.0–34.0)
MCHC: 35 g/dL (ref 32.0–36.0)
MCV: 83.2 fL (ref 80.0–100.0)
MONO ABS: 0.7 10*3/uL (ref 0.2–1.0)
Monocytes Relative: 10 %
Neutro Abs: 3.2 10*3/uL (ref 1.4–6.5)
Neutrophils Relative %: 43 %
PLATELETS: 278 10*3/uL (ref 150–440)
RBC: 5.38 MIL/uL (ref 4.40–5.90)
RDW: 14.8 % — AB (ref 11.5–14.5)
WBC: 7.5 10*3/uL (ref 3.8–10.6)

## 2016-03-26 LAB — COMPREHENSIVE METABOLIC PANEL
ALBUMIN: 3.8 g/dL (ref 3.5–5.0)
ALT: 27 U/L (ref 17–63)
ANION GAP: 8 (ref 5–15)
AST: 28 U/L (ref 15–41)
Alkaline Phosphatase: 104 U/L (ref 38–126)
BUN: 15 mg/dL (ref 6–20)
CO2: 26 mmol/L (ref 22–32)
Calcium: 9.3 mg/dL (ref 8.9–10.3)
Chloride: 106 mmol/L (ref 101–111)
Creatinine, Ser: 0.96 mg/dL (ref 0.61–1.24)
GFR calc non Af Amer: >60 mL/min (ref >60)
GLUCOSE: 129 mg/dL — AB (ref 65–99)
POTASSIUM: 3.6 mmol/L (ref 3.5–5.1)
SODIUM: 140 mmol/L (ref 135–145)
Total Bilirubin: 1.5 mg/dL — ABNORMAL HIGH (ref 0.3–1.2)
Total Protein: 7.6 g/dL (ref 6.5–8.1)

## 2016-03-26 LAB — TSH: TSH: 2.317 u[IU]/mL (ref 0.350–4.500)

## 2016-03-26 LAB — LIPID PANEL
CHOL/HDL RATIO: 4.2 ratio
Cholesterol: 200 mg/dL (ref 0–200)
HDL: 48 mg/dL (ref >40)
LDL CALC: 132 mg/dL — AB (ref 0–99)
TRIGLYCERIDES: 98 mg/dL (ref <150)
VLDL: 20 mg/dL (ref 0–40)

## 2016-03-26 LAB — PSA: PSA: 38.21 ng/mL — AB (ref 0.00–4.00)

## 2016-03-27 DIAGNOSIS — E1159 Type 2 diabetes mellitus with other circulatory complications: Secondary | ICD-10-CM | POA: Diagnosis not present

## 2016-03-27 DIAGNOSIS — E785 Hyperlipidemia, unspecified: Secondary | ICD-10-CM | POA: Diagnosis not present

## 2016-03-27 DIAGNOSIS — I10 Essential (primary) hypertension: Secondary | ICD-10-CM | POA: Diagnosis not present

## 2016-03-27 LAB — T4: T4, Total: 14.6 ug/dL — ABNORMAL HIGH (ref 4.5–12.0)

## 2016-04-01 DIAGNOSIS — R972 Elevated prostate specific antigen [PSA]: Secondary | ICD-10-CM | POA: Diagnosis not present

## 2016-04-03 ENCOUNTER — Ambulatory Visit: Payer: Medicare HMO | Admitting: Podiatry

## 2016-04-04 ENCOUNTER — Encounter: Payer: Self-pay | Admitting: Urology

## 2016-04-04 ENCOUNTER — Ambulatory Visit: Payer: Self-pay | Admitting: Urology

## 2016-04-05 ENCOUNTER — Other Ambulatory Visit: Payer: Self-pay | Admitting: Nurse Practitioner

## 2016-04-07 NOTE — Telephone Encounter (Signed)
Pt has no PCP or future appt. Last seen 01/05/15 by carrie. Pt's last a1c in 2015. Recent labs from hospital encounter. Please advise?

## 2016-04-09 DIAGNOSIS — I6523 Occlusion and stenosis of bilateral carotid arteries: Secondary | ICD-10-CM | POA: Diagnosis not present

## 2016-04-14 DIAGNOSIS — E118 Type 2 diabetes mellitus with unspecified complications: Secondary | ICD-10-CM | POA: Diagnosis not present

## 2016-04-14 DIAGNOSIS — I10 Essential (primary) hypertension: Secondary | ICD-10-CM | POA: Diagnosis not present

## 2016-04-14 DIAGNOSIS — E785 Hyperlipidemia, unspecified: Secondary | ICD-10-CM | POA: Diagnosis not present

## 2016-04-17 ENCOUNTER — Ambulatory Visit: Payer: Medicare Other | Admitting: Podiatry

## 2016-04-18 DIAGNOSIS — R972 Elevated prostate specific antigen [PSA]: Secondary | ICD-10-CM | POA: Diagnosis not present

## 2016-04-18 DIAGNOSIS — E1159 Type 2 diabetes mellitus with other circulatory complications: Secondary | ICD-10-CM | POA: Diagnosis not present

## 2016-04-18 DIAGNOSIS — I6523 Occlusion and stenosis of bilateral carotid arteries: Secondary | ICD-10-CM | POA: Diagnosis not present

## 2016-04-22 ENCOUNTER — Encounter: Payer: Self-pay | Admitting: Urology

## 2016-04-22 ENCOUNTER — Ambulatory Visit (INDEPENDENT_AMBULATORY_CARE_PROVIDER_SITE_OTHER): Payer: Medicare Other | Admitting: Urology

## 2016-05-26 NOTE — Progress Notes (Signed)
No show

## 2016-05-27 DIAGNOSIS — I6523 Occlusion and stenosis of bilateral carotid arteries: Secondary | ICD-10-CM | POA: Diagnosis not present

## 2016-05-27 DIAGNOSIS — R972 Elevated prostate specific antigen [PSA]: Secondary | ICD-10-CM | POA: Diagnosis not present

## 2016-05-27 DIAGNOSIS — E1159 Type 2 diabetes mellitus with other circulatory complications: Secondary | ICD-10-CM | POA: Diagnosis not present

## 2016-05-27 DIAGNOSIS — I10 Essential (primary) hypertension: Secondary | ICD-10-CM | POA: Diagnosis not present

## 2016-06-09 DIAGNOSIS — R000 Tachycardia, unspecified: Secondary | ICD-10-CM | POA: Diagnosis not present

## 2016-06-09 DIAGNOSIS — R413 Other amnesia: Secondary | ICD-10-CM | POA: Diagnosis not present

## 2016-06-09 DIAGNOSIS — E119 Type 2 diabetes mellitus without complications: Secondary | ICD-10-CM | POA: Diagnosis not present

## 2016-06-09 DIAGNOSIS — E559 Vitamin D deficiency, unspecified: Secondary | ICD-10-CM | POA: Diagnosis not present

## 2016-06-09 DIAGNOSIS — I10 Essential (primary) hypertension: Secondary | ICD-10-CM | POA: Diagnosis not present

## 2016-06-09 DIAGNOSIS — E785 Hyperlipidemia, unspecified: Secondary | ICD-10-CM | POA: Diagnosis not present

## 2016-06-09 DIAGNOSIS — Z1329 Encounter for screening for other suspected endocrine disorder: Secondary | ICD-10-CM | POA: Diagnosis not present

## 2016-06-09 DIAGNOSIS — E1159 Type 2 diabetes mellitus with other circulatory complications: Secondary | ICD-10-CM | POA: Diagnosis not present

## 2016-06-16 DIAGNOSIS — I10 Essential (primary) hypertension: Secondary | ICD-10-CM | POA: Diagnosis not present

## 2016-07-05 ENCOUNTER — Emergency Department: Payer: Medicare Other

## 2016-07-05 ENCOUNTER — Emergency Department
Admission: EM | Admit: 2016-07-05 | Discharge: 2016-07-05 | Disposition: A | Discharge status: Home / Self Care | Payer: Medicare Other | Attending: Emergency Medicine | Admitting: Emergency Medicine

## 2016-07-05 DIAGNOSIS — I110 Hypertensive heart disease with heart failure: Secondary | ICD-10-CM | POA: Insufficient documentation

## 2016-07-05 DIAGNOSIS — E119 Type 2 diabetes mellitus without complications: Secondary | ICD-10-CM | POA: Diagnosis not present

## 2016-07-05 DIAGNOSIS — I5022 Chronic systolic (congestive) heart failure: Secondary | ICD-10-CM | POA: Insufficient documentation

## 2016-07-05 DIAGNOSIS — Z7982 Long term (current) use of aspirin: Secondary | ICD-10-CM | POA: Diagnosis not present

## 2016-07-05 DIAGNOSIS — I2510 Atherosclerotic heart disease of native coronary artery without angina pectoris: Secondary | ICD-10-CM | POA: Insufficient documentation

## 2016-07-05 DIAGNOSIS — G459 Transient cerebral ischemic attack, unspecified: Secondary | ICD-10-CM | POA: Diagnosis not present

## 2016-07-05 DIAGNOSIS — R531 Weakness: Secondary | ICD-10-CM | POA: Diagnosis not present

## 2016-07-05 DIAGNOSIS — F17210 Nicotine dependence, cigarettes, uncomplicated: Secondary | ICD-10-CM | POA: Insufficient documentation

## 2016-07-05 DIAGNOSIS — Z7984 Long term (current) use of oral hypoglycemic drugs: Secondary | ICD-10-CM | POA: Diagnosis not present

## 2016-07-05 DIAGNOSIS — R296 Repeated falls: Secondary | ICD-10-CM | POA: Diagnosis present

## 2016-07-05 LAB — CBC
HCT: 39.7 % — ABNORMAL LOW (ref 40.0–52.0)
HEMOGLOBIN: 13.4 g/dL (ref 13.0–18.0)
MCH: 28.5 pg (ref 26.0–34.0)
MCHC: 33.8 g/dL (ref 32.0–36.0)
MCV: 84.2 fL (ref 80.0–100.0)
Platelets: 214 10*3/uL (ref 150–440)
RBC: 4.72 MIL/uL (ref 4.40–5.90)
RDW: 15.9 % — ABNORMAL HIGH (ref 11.5–14.5)
WBC: 5 10*3/uL (ref 3.8–10.6)

## 2016-07-05 LAB — COMPREHENSIVE METABOLIC PANEL
ALT: 20 U/L (ref 17–63)
AST: 29 U/L (ref 15–41)
Albumin: 3.8 g/dL (ref 3.5–5.0)
Alkaline Phosphatase: 112 U/L (ref 38–126)
Anion gap: 10 (ref 5–15)
BILIRUBIN TOTAL: 1.9 mg/dL — AB (ref 0.3–1.2)
BUN: 14 mg/dL (ref 6–20)
CALCIUM: 9 mg/dL (ref 8.9–10.3)
CO2: 21 mmol/L — ABNORMAL LOW (ref 22–32)
CREATININE: 1.05 mg/dL (ref 0.61–1.24)
Chloride: 109 mmol/L (ref 101–111)
GFR calc Af Amer: >60 mL/min (ref >60)
Glucose, Bld: 187 mg/dL — ABNORMAL HIGH (ref 65–99)
POTASSIUM: 3.7 mmol/L (ref 3.5–5.1)
Sodium: 140 mmol/L (ref 135–145)
Total Protein: 7.3 g/dL (ref 6.5–8.1)

## 2016-07-05 LAB — PROTIME-INR
INR: 1.1
Prothrombin Time: 14.2 seconds (ref 11.4–15.2)

## 2016-07-05 LAB — TROPONIN I: Troponin I: 0.04 ng/mL (ref <0.03)

## 2016-07-05 LAB — ETHANOL: Alcohol, Ethyl (B): <5 mg/dL (ref <5)

## 2016-07-05 MED ORDER — CLOPIDOGREL BISULFATE 75 MG PO TABS: 75.0000 mg | ORAL_TABLET | Freq: Every day | ORAL | 0 refills | Status: AC

## 2016-07-05 NOTE — ED Triage Notes (Signed)
Pt states that he has been falling more frequently, states that once he falls down he can't get him self up. Pt has had multiple strokes and he thinks this is causing his weakness. Pt also states that he has ran out of his insulin.states his concern is that he has no strength on the left side that started 2 weeks ago

## 2016-07-05 NOTE — ED Notes (Signed)
Pt verbalized understanding of discharge instructions. NAD at this time. 

## 2016-07-05 NOTE — ED Notes (Signed)
FIRST NURSE NOTE: Pt ran out of insulin 2 days ago, hx of strokes in the past progressively weak, ambulatory in lobby, pt still drives and lives alone.  Chronic left side weakness no slurred speech noted at this time. Denies any numbness.

## 2016-07-05 NOTE — ED Provider Notes (Signed)
Time Seen: Approximately 1352  I have reviewed the triage notes  Chief Complaint: Fall   History of Present Illness: Jerry Graham is a 65 y.o. male *who has a history of previous atrial fibrillation and ischemic strokes. The patient has diabetes and a history of coronary artery disease. He denies any chest pain, shortness of breath, nausea or vomiting. He was brought here by his family for evaluation of 2 weeks of some left-sided upper and lower extremity weakness that seems to wax and wane in its intensity. He denies any headaches blurred vision loss of vision trouble with speech or swallowing. He is currently on aspirin and is been worked up for strokes before in the past. Past Medical History:  Diagnosis Date  . A-fib (HCC)   . CHF (congestive heart failure) (HCC)   . Coronary artery disease   . Diabetes mellitus without complication (HCC)   . Hyperlipidemia   . Hypertension   . Short-term memory loss    from stroke  . Stroke [facility]) 2013, 2014  . Tobacco abuse     Patient Active Problem List   Diagnosis Date Noted  . Diabetes type 2, controlled (HCC) 02/28/2014  . Screen for colon cancer 02/28/2014  . Diabetic eye exam (HCC) 02/28/2014  . Wide-complex tachycardia (HCC) 09/06/2013  . Atrial fibrillation with RVR (HCC) 09/06/2013  . CAD (coronary artery disease) 08/30/2013  . Tobacco abuse counseling 07/28/2013  . Colon cancer screening 05/27/2013  . HLD (hyperlipidemia) 05/27/2013  . Chronic systolic CHF (congestive heart failure) (HCC) 04/04/2013  . Type II diabetes mellitus (HCC) 03/30/2013  . Acute ischemic stroke (HCC) 03/30/2013  . Hypertension 03/30/2013    Past Surgical History:  Procedure Laterality Date  . CARDIAC CATHETERIZATION    . CORONARY ARTERY BYPASS GRAFT N/A 08/30/2013   Procedure: CORONARY ARTERY BYPASS GRAFTING (CABG);  Surgeon: Kerin Perna, MD;  Location: [facility] OR;  Service: Open Heart Surgery;  Laterality: N/A;  CABG x  3 , using left internal  mammary artery and right leg greater saphenous vein harvested endoscopically  . INTRAOPERATIVE TRANSESOPHAGEAL ECHOCARDIOGRAM N/A 08/30/2013   Procedure: INTRAOPERATIVE TRANSESOPHAGEAL ECHOCARDIOGRAM;  Surgeon: Kerin Perna, MD;  Location: [facility] OR;  Service: Open Heart Surgery;  Laterality: N/A;  . LEFT HEART CATHETERIZATION WITH CORONARY ANGIOGRAM N/A 06/16/2013   Procedure: LEFT HEART CATHETERIZATION WITH CORONARY ANGIOGRAM;  Surgeon: Alvia Grove, MD;  Location: [facility] CATH LAB;  Service: Cardiovascular;  Laterality: N/A;  . LUNG BIOPSY     years ago  . TEE WITHOUT CARDIOVERSION N/A 04/27/2013   Procedure: TRANSESOPHAGEAL ECHOCARDIOGRAM (TEE);  Surgeon: Vesta Mixer, MD;  Location: [facility] ENDOSCOPY;  Service: Cardiovascular;  Laterality: N/A;    Past Surgical History:  Procedure Laterality Date  . CARDIAC CATHETERIZATION    . CORONARY ARTERY BYPASS GRAFT N/A 08/30/2013   Procedure: CORONARY ARTERY BYPASS GRAFTING (CABG);  Surgeon: Kerin Perna, MD;  Location: [facility] OR;  Service: Open Heart Surgery;  Laterality: N/A;  CABG x  3 , using left internal mammary artery and right leg greater saphenous vein harvested endoscopically  . INTRAOPERATIVE TRANSESOPHAGEAL ECHOCARDIOGRAM N/A 08/30/2013   Procedure: INTRAOPERATIVE TRANSESOPHAGEAL ECHOCARDIOGRAM;  Surgeon: Kerin Perna, MD;  Location: [facility] OR;  Service: Open Heart Surgery;  Laterality: N/A;  . LEFT HEART CATHETERIZATION WITH CORONARY ANGIOGRAM N/A 06/16/2013   Procedure: LEFT HEART CATHETERIZATION WITH CORONARY ANGIOGRAM;  Surgeon: Alvia Grove, MD;  Location[facility] C[facility] LAB;  Service: Cardiovascular;  Laterality: N/A;  .  LUNG BIOPSY     years ago  . TEE WITHOUT CARDIOVERSION N/A 04/27/2013   Procedure: TRANSESOPHAGEAL ECHOCARDIOGRAM (TEE);  Surgeon: Vesta MixerPhilip J Nahser, MD;  [facility];  Service: Cardiovascular;  Laterality: N/A;    Current Outpatient Rx  . Order #: [id number] Class: Historical Med  . Order #: [id number] Class:  Normal  . Order #: [id number] Class: Normal  . Order #: [id number] Class: Normal  . Order #: [id number] Class: Print  . Order #: [id number] Class: Normal  . Order #: [id number] Class: Normal  . Order #: [id number] Class: Normal  . Order #: [id number] Class: Normal  . Order #: [id number] Class: Normal  . Order #: [id number] Class: Historical Med    Allergies:  Patient has no known allergies.  Family History: Family History  Problem Relation Age of Onset  . Scleroderma Mother     Living, 289  . Heart disease Mother   . Diabetes Father     Living, 1189  . Hypertension Father   . Transient ischemic attack Father   . Hypertension Sister   . Hyperlipidemia Sister     Social History: Social History  Substance Use Topics  . Smoking status: Current Some Day Smoker    Packs/day: 0.25    Years: 50.00    Types: Cigarettes    Last attempt to quit: 08/29/2013  . Smokeless tobacco: Not on file     Comment: currently smokes 1/5 ppd  . Alcohol use No     Review of Systems:   10 point review of systems was performed and was otherwise negative:  Constitutional: No fever Eyes: No visual disturbances ENT: No sore throat, ear pain Cardiac: No chest pain Respiratory: No shortness of breath, wheezing, or stridor Abdomen: No abdominal pain, no vomiting, No diarrhea Endocrine: No weight loss, No night sweats Extremities: No peripheral edema, cyanosis Skin: No rashes, easy bruising Neurologic: Left-sided upper or lower extremity weakness seems to wax and wane in its intensity though he states that the episode that he had where he couldn't stand up current almost 2 weeks ago. He states he feels fine at present.  Urologic: No dysuria, Hematuria, or urinary frequency  Physical Exam:  ED Triage Vitals [07/05/16 1348]  Enc Vitals Group     BP (!) 157/126     Pulse Rate 97     Resp 18     Temp 97.7 F (36.5 C)     Temp Source Oral     SpO2 99 %     Weight 180 lb (81.6 kg)     Height 5\' 10"  (1.778 m)      Head Circumference      Peak Flow      Pain Score      Pain Loc      Pain Edu?      Excl. in GC?     General: Awake , Alert , and Oriented times 3; GCS 15 Head: Normal cephalic , atraumatic Eyes: Pupils equal , round, reactive to light Nose/Throat: No nasal drainage, patent upper airway without erythema or exudate.  Neck: Supple, Full range of motion, No anterior adenopathy or palpable thyroid masses Lungs: Clear to ascultation without wheezes , rhonchi, or rales Heart: Regular rate, regular rhythm without murmurs , gallops , or rubs Abdomen: Soft, non tender without rebound, guarding , or rigidity; bowel sounds positive and symmetric in all 4 quadrants. No organomegaly .        Extremities: 2 plus symmetric pulses. No edema, clubbing or cyanosis Neurologic:Patient has normal ambulation and  only a slight deficit between the left upper extremity and the right upper extremity in comparison. He also only has a minor deficit in the left leg compared to the right. Skin: warm, dry, no rashes   Labs:   All laboratory work was reviewed including any pertinent negatives or positives listed below:  Labs Reviewed  COMPREHENSIVE METABOLIC PANEL - Abnormal; Notable for the following:       Result Value   CO2 21 (*)    Glucose, Bld 187 (*)    Total Bilirubin 1.9 (*)    All other components within normal limits  CBC - Abnormal; Notable for the following:    HCT 39.7 (*)    RDW 15.9 (*)    All other components within normal limits  TROPONIN I - Abnormal; Notable for the following:    Troponin I 0.04 (*)    All other components within normal limits  ETHANOL  PROTIME-INR  Patient's troponins only slightly elevated and without any clinical signs or symptoms of an acute coronary accident. I felt this was likely to be within lab error.  EKG: * ED ECG REPORT I, Jennye MoccasinBrian S Jacee Enerson, the attending physician, personally viewed and interpreted this ECG.  Date: 07/05/2016 EKG Time: 1352 Rate:  *101 Rhythm: Sinus tachycardia QRS Axis: normal Intervals: normal ST/T Wave abnormalities: normal Conduction Disturbances: none Narrative Interpretation: unremarkable Left posterior fascicular block Old anterior septal infarct Nonspecific ST-T wave changes, no acute changes   Radiology: * "Ct Head Wo Contrast  Result Date: 07/05/2016 CLINICAL DATA:  Left-sided weakness. EXAM: CT HEAD WITHOUT CONTRAST TECHNIQUE: Contiguous axial images were obtained from the base of the skull through the vertex without intravenous contrast. COMPARISON:  CT scan of February 05, 2016. FINDINGS: Brain: Mild chronic ischemic white matter disease is noted. Minimal diffuse cortical atrophy is noted. No mass effect or midline shift is noted. Ventricular size is within normal limits. There is no evidence of mass lesion, hemorrhage or acute infarction. Stable old lacunar infarction is noted in right basal ganglia. Vascular: No hyperdense vessel or unexpected calcification. Skull: Normal. Negative for fracture or focal lesion. Sinuses/Orbits: No acute finding. Other: None. IMPRESSION: Mild chronic ischemic white matter disease. Minimal diffuse cortical atrophy. No acute intracranial abnormality seen. Electronically Signed   By: Lupita RaiderJames  Green Jr, M.D.   On: 07/05/2016 15:31  "  I personally reviewed the radiologic studies    ED Course:  Patient's stay here was uneventful and he apparently does have enough insulin for home usage and his family states he'll help him give the insulin and continue with outpatient follow-up. Patient does not exhibit any significant focal deficits and may be having transient ischemic attacks. Patient's already been evaluated in the past for acute stroke and I felt could be seen on an outpatient basis with a boost in his anti-coagulation medication beyond aspirin. The patient was prescribed Plavix which I felt was low risk given his history of falls etc. He was advised to continue with the aspirin  therapy and to follow-up with his primary physician for further outpatient workup. The patient only had 1 single episode where he describes having difficulty standing up while he was putting air in a tire. No other specific time as far as when this weakness occurred and he states that event happened approximately 2 weeks ago. The family brought him here to the emergency department today because they were just recently became aware of this particular event. Clinical Course      Assessment: *  Possible transient ischemic attack  Final Clinical Impression:   Final diagnoses:  Transient cerebral ischemia, unspecified type     Plan:  Outpatient Patient was advised to return immediately if condition worsens. Patient was advised to follow up with their primary care physician or other specialized physicians involved in their outpatient care. The patient and/or family member/power of attorney had laboratory results reviewed at the bedside. All questions and concerns were addressed and appropriate discharge instructions were distributed by the nursing staff.            Jennye Moccasin, MD 07/05/16 [phone]

## 2016-07-05 NOTE — Discharge Instructions (Signed)
Please return immediately if condition worsens. Please contact her primary physician or the physician you were given for referral. If you have any specialist physicians involved in her treatment and plan please also contact them. Thank you for using [facility].  He may have had symptoms consistent with a transient ischemic attack. Please continue your aspirin therapy and all of your other medications including her insulin. Please keep close track of your blood sugar and if he have any further episodes of acute weakness then please return here to the emergency department. Your primary physician may have other further outpatient treatment to offer including carotid Dopplers, echocardiogram of the heart, etc. There does not appear to be a reason to acutely [place] to the hospital. We added Plavix to your anticoagulation regimen and he may be a candidate for more aggressive anticoagulation for your primary physician.

## 2016-08-07 DEATH — deceased

## 2017-02-08 IMAGING — CT CT HEAD W/O CM
3 series · 16 of 47 positions shown, 19 images · non-contrast
Comparison: CT scan of February 05, 2016.

CLINICAL DATA: Left-sided weakness.

EXAM:
CT HEAD WITHOUT CONTRAST
TECHNIQUE: Contiguous axial images were obtained from the base of the skull
through the vertex without intravenous contrast.

[Series 3: head wo · axial · 0.42mm/px · z∈[-178,-53]mm · 10 of 30 slices shown, 13 images]
[im 3/30  brain]
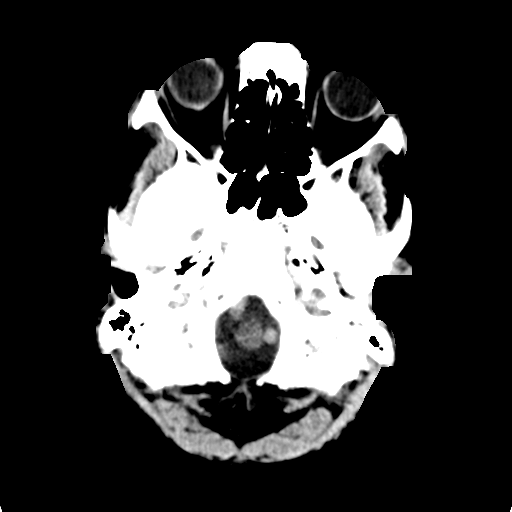
[im 3/30  bone]
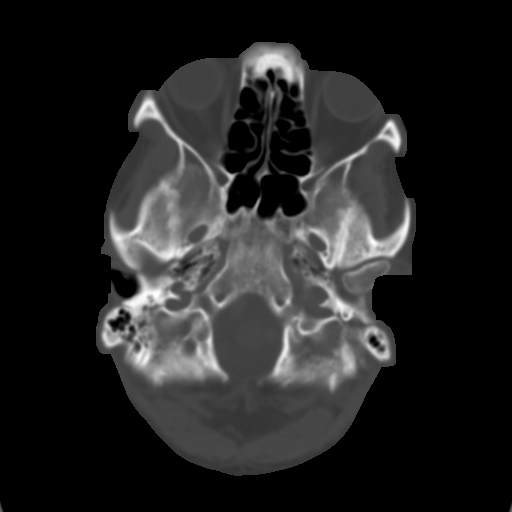
[im 6/30  brain]
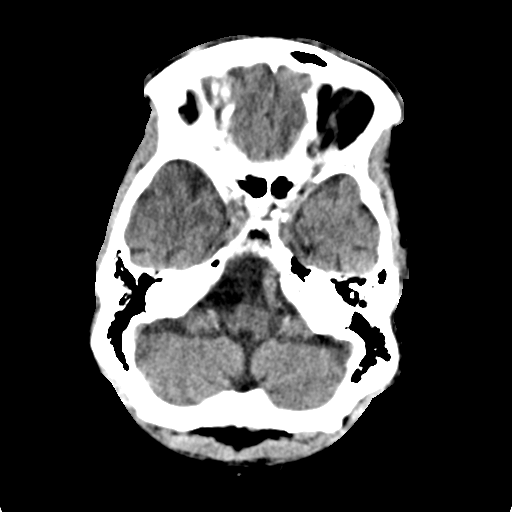
[im 9/30  brain]
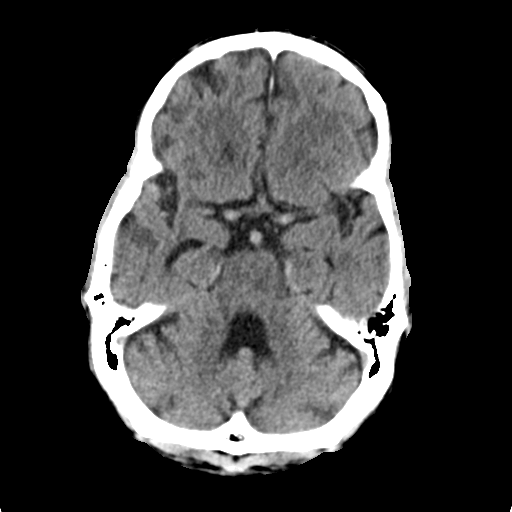
[im 11/30  brain]
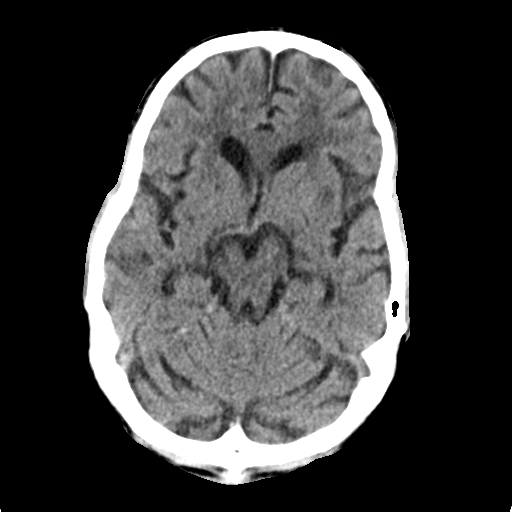
[im 14/30  brain]
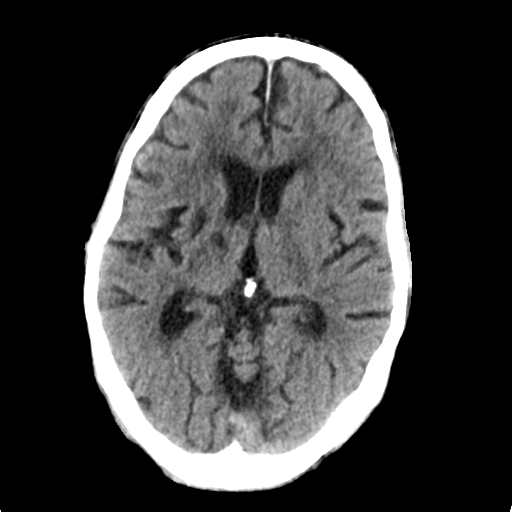
[im 14/30  bone]
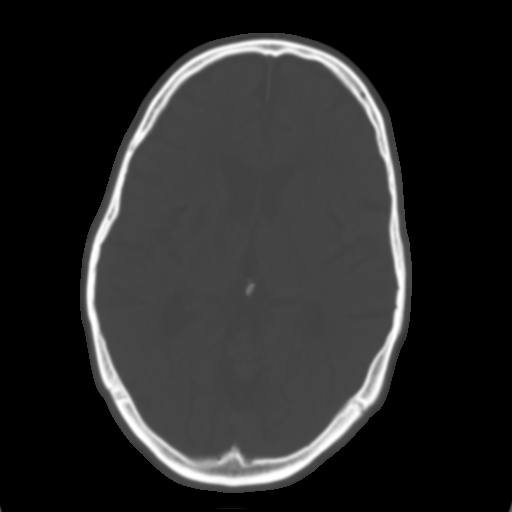
[im 17/30  brain]
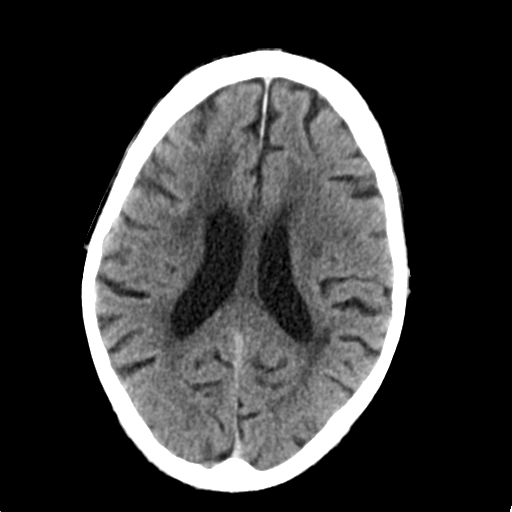
[im 20/30  brain]
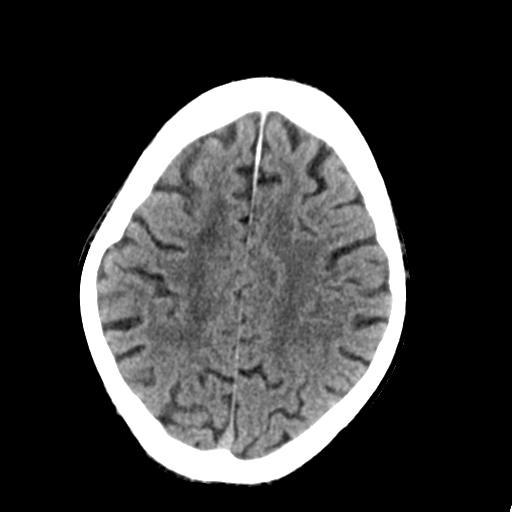
[im 23/30  brain]
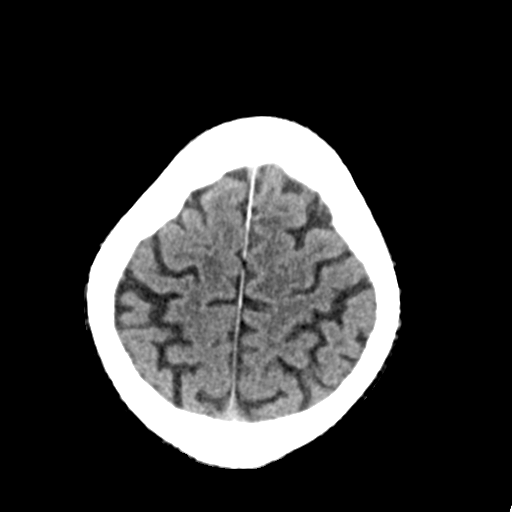
[im 25/30  brain]
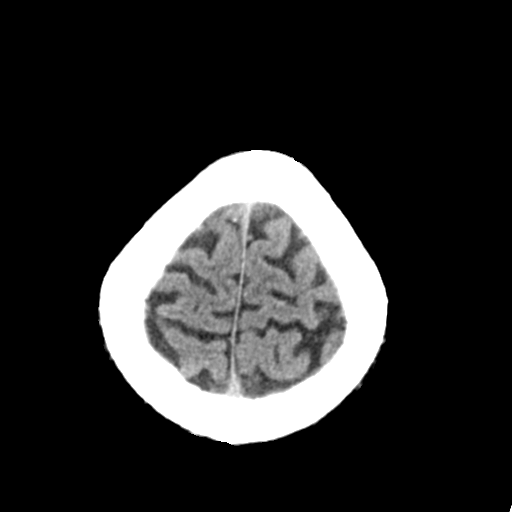
[im 25/30  bone]
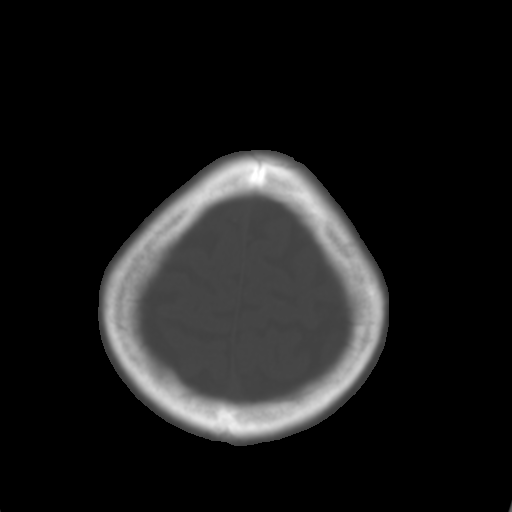
[im 28/30  brain]
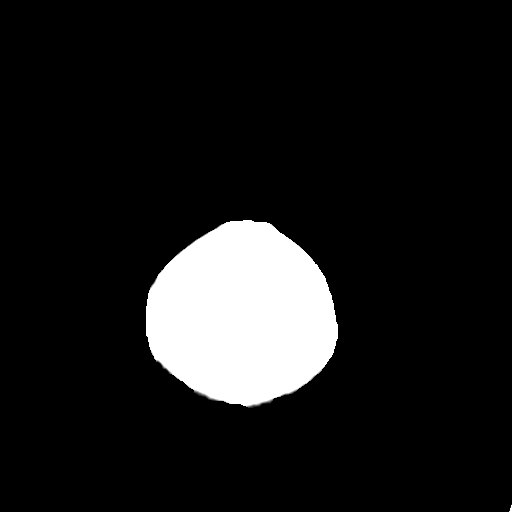

[Series 4: coronal soft tissue · coronal · 0.30mm/px · 3 of 67 slices shown]
[im 23/67  brain]
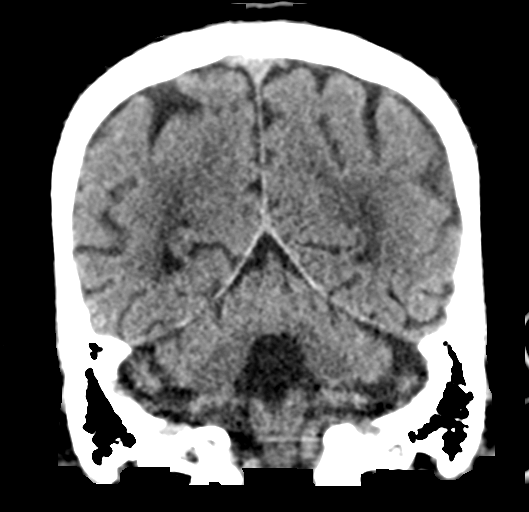
[im 30/67  brain]
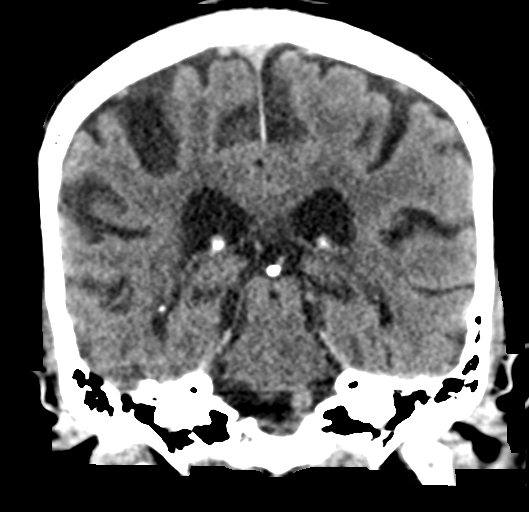
[im 37/67  brain]
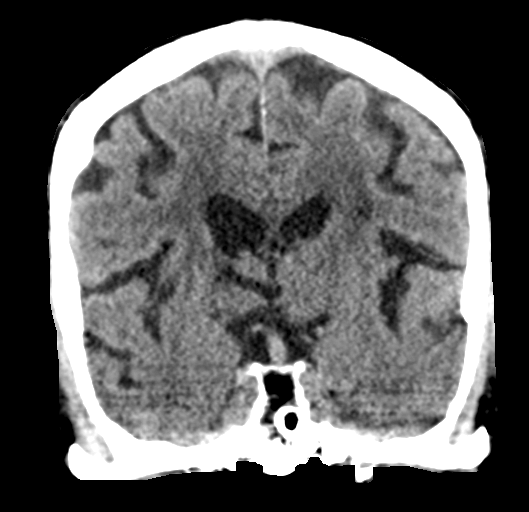

[Series 5: sagittal soft tissue · sagittal · 0.34mm/px · 3 of 50 slices shown]
[im 17/50  brain]
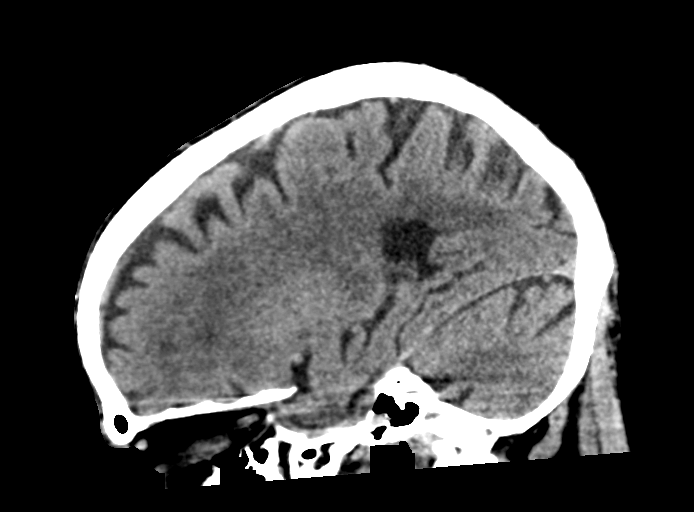
[im 25/50  brain]
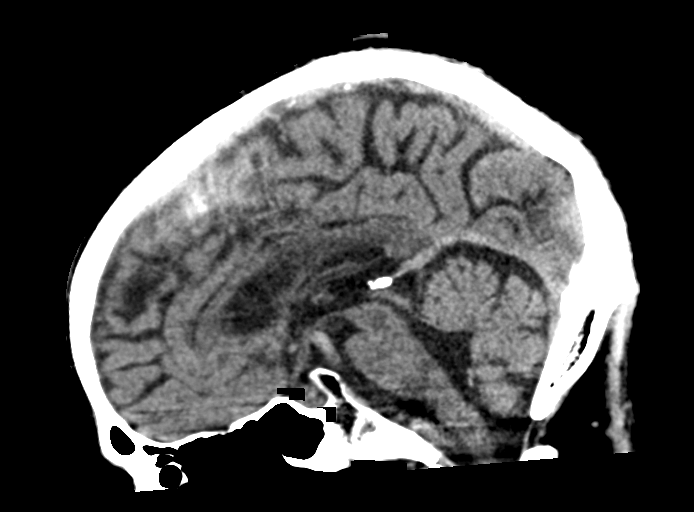
[im 33/50  brain]
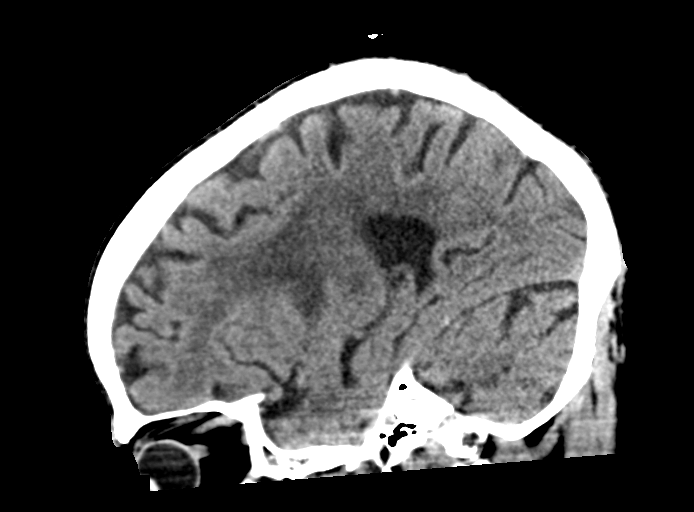

[16 of 47 positions shown; findings below may reference images not displayed]

FINDINGS: Brain: Mild chronic ischemic white matter disease is noted. Minimal
diffuse cortical atrophy is noted. No mass effect or midline shift
is noted. Ventricular size is within normal limits. There is no
evidence of mass lesion, hemorrhage or acute infarction. Stable old
lacunar infarction is noted in right basal ganglia.

Vascular: No hyperdense vessel or unexpected calcification.

Skull: Normal. Negative for fracture or focal lesion.

Sinuses/Orbits: No acute finding.

Other: None.
IMPRESSION: Mild chronic ischemic white matter disease. Minimal diffuse cortical
atrophy. No acute intracranial abnormality seen.
